# Patient Record
Sex: Female | Born: 1965 | Race: White | Hispanic: No | Marital: Married | Smoking: Former smoker
Health system: Southern US, Community
[De-identification: ages and names within clinical notes are randomized; demographics above are authoritative.]

## PROBLEM LIST (undated history)

## (undated) DIAGNOSIS — C3490 Malignant neoplasm of unspecified part of unspecified bronchus or lung: Secondary | ICD-10-CM

## (undated) DIAGNOSIS — J189 Pneumonia, unspecified organism: Secondary | ICD-10-CM

## (undated) DIAGNOSIS — F329 Major depressive disorder, single episode, unspecified: Secondary | ICD-10-CM

## (undated) DIAGNOSIS — F419 Anxiety disorder, unspecified: Secondary | ICD-10-CM

## (undated) DIAGNOSIS — R569 Unspecified convulsions: Secondary | ICD-10-CM

## (undated) DIAGNOSIS — G43909 Migraine, unspecified, not intractable, without status migrainosus: Secondary | ICD-10-CM

## (undated) DIAGNOSIS — F32A Depression, unspecified: Secondary | ICD-10-CM

## (undated) HISTORY — DX: Migraine, unspecified, not intractable, without status migrainosus: G43.909

## (undated) HISTORY — DX: Major depressive disorder, single episode, unspecified: F32.9

## (undated) HISTORY — DX: Unspecified convulsions: R56.9

## (undated) HISTORY — DX: Depression, unspecified: F32.A

## (undated) HISTORY — DX: Malignant neoplasm of unspecified part of unspecified bronchus or lung: C34.90

## (undated) HISTORY — DX: Anxiety disorder, unspecified: F41.9

---

## 1998-04-20 HISTORY — PX: ABDOMINAL HYSTERECTOMY: SHX81

## 1998-08-26 ENCOUNTER — Other Ambulatory Visit: Admission: RE | Admit: 1998-08-26 | Discharge: 1998-08-26 | Payer: Self-pay | Admitting: Obstetrics and Gynecology

## 1998-11-13 ENCOUNTER — Inpatient Hospital Stay (HOSPITAL_COMMUNITY): Admission: RE | Admit: 1998-11-13 | Discharge: 1998-11-14 | Payer: Self-pay | Admitting: Gynecology

## 1998-11-13 ENCOUNTER — Encounter (INDEPENDENT_AMBULATORY_CARE_PROVIDER_SITE_OTHER): Payer: Self-pay | Admitting: Specialist

## 1999-10-20 ENCOUNTER — Emergency Department (HOSPITAL_COMMUNITY): Admission: EM | Admit: 1999-10-20 | Discharge: 1999-10-20 | Payer: Self-pay | Admitting: Emergency Medicine

## 2000-06-02 ENCOUNTER — Other Ambulatory Visit: Admission: RE | Admit: 2000-06-02 | Discharge: 2000-06-02 | Payer: Self-pay | Admitting: Gynecology

## 2000-06-09 ENCOUNTER — Encounter: Payer: Self-pay | Admitting: Gynecology

## 2000-06-09 ENCOUNTER — Encounter: Admission: RE | Admit: 2000-06-09 | Discharge: 2000-06-09 | Payer: Self-pay | Admitting: Gynecology

## 2000-06-26 ENCOUNTER — Observation Stay (HOSPITAL_COMMUNITY): Admission: EM | Admit: 2000-06-26 | Discharge: 2000-06-27 | Payer: Self-pay | Admitting: *Deleted

## 2000-06-26 ENCOUNTER — Encounter: Payer: Self-pay | Admitting: Family Medicine

## 2000-06-29 ENCOUNTER — Encounter: Admission: RE | Admit: 2000-06-29 | Discharge: 2000-06-29 | Payer: Self-pay | Admitting: Family Medicine

## 2001-06-24 ENCOUNTER — Other Ambulatory Visit: Admission: RE | Admit: 2001-06-24 | Discharge: 2001-06-24 | Payer: Self-pay | Admitting: Gynecology

## 2002-06-02 ENCOUNTER — Encounter: Admission: RE | Admit: 2002-06-02 | Discharge: 2002-06-02 | Payer: Self-pay | Admitting: Gynecology

## 2002-06-02 ENCOUNTER — Encounter: Payer: Self-pay | Admitting: Gynecology

## 2002-06-26 ENCOUNTER — Other Ambulatory Visit: Admission: RE | Admit: 2002-06-26 | Discharge: 2002-06-26 | Payer: Self-pay | Admitting: Gynecology

## 2003-06-29 ENCOUNTER — Other Ambulatory Visit: Admission: RE | Admit: 2003-06-29 | Discharge: 2003-06-29 | Payer: Self-pay | Admitting: Gynecology

## 2003-06-29 ENCOUNTER — Encounter: Admission: RE | Admit: 2003-06-29 | Discharge: 2003-06-29 | Payer: Self-pay | Admitting: Gynecology

## 2004-03-17 ENCOUNTER — Emergency Department (HOSPITAL_COMMUNITY): Admission: EM | Admit: 2004-03-17 | Discharge: 2004-03-17 | Payer: Self-pay | Admitting: Emergency Medicine

## 2004-05-09 ENCOUNTER — Emergency Department (HOSPITAL_COMMUNITY): Admission: EM | Admit: 2004-05-09 | Discharge: 2004-05-09 | Payer: Self-pay | Admitting: *Deleted

## 2004-07-01 ENCOUNTER — Other Ambulatory Visit: Admission: RE | Admit: 2004-07-01 | Discharge: 2004-07-01 | Payer: Self-pay | Admitting: Gynecology

## 2004-10-16 ENCOUNTER — Encounter: Admission: RE | Admit: 2004-10-16 | Discharge: 2004-10-16 | Payer: Self-pay | Admitting: Gynecology

## 2005-07-15 ENCOUNTER — Other Ambulatory Visit: Admission: RE | Admit: 2005-07-15 | Discharge: 2005-07-15 | Payer: Self-pay | Admitting: Gynecology

## 2006-09-09 ENCOUNTER — Encounter: Admission: RE | Admit: 2006-09-09 | Discharge: 2006-09-09 | Payer: Self-pay | Admitting: Gynecology

## 2006-09-09 ENCOUNTER — Other Ambulatory Visit: Admission: RE | Admit: 2006-09-09 | Discharge: 2006-09-09 | Payer: Self-pay | Admitting: Gynecology

## 2010-09-05 NOTE — H&P (Signed)
[place]. [facility]  Patient:    Sharon Berg, Sharon Berg                       MRN: [id number] Adm. Date:  [id number] Disc. Date: [id number] Attending:  Sanjuana Letters Dictator:   Andrey Spearman, M.D.                         History and Physical  CHIEF COMPLAINT: Rash, swelling, and itching.  HISTORY OF PRESENT ILLNESS: This patient is a 45 year old white female who presents to the [facility] Emergency Department after leaving against medical advice from [facility] today.  The patient reports that on Tuesday while at work she had sudden onset of itching all over and swelling around the waist and just below the waist.  She then went to Urgent Care, where she was given a steroid injection, oral prednisone to take home.  Then on Wednesday she had worsening symptoms.  She then saw her primary care physician, Dr. Clovis Riley who she says works at [employer], who continued the prednisone and started Zyrtec.  On Thursday she then had acute worsening of her symptoms with her eyes swelling shut, shortness of breath, and a rash described as wet-like with migratory symptoms and itching. She then went to [facility] Emergency Room, where her examination was described as showing marked edema of the face, urticarial rash, and diffuse skin redness.  She was admitted to the hospital and begun on Solu-Medrol, Tagamet, Benadryl, p.o. Periactin, and was given a shot of subcu epinephrine. She was observed in the emergency room and did have improvement of her symptoms and then worsening two hours later.  The patient reports that despite three days of treatment in the hospital she has continued to have waxing and waning of her symptoms.  She denies any new foods, medication, makeup, lotion, detergent, hair products, clothing, skin bites, any recent viral illnesses, any recent fevers, dysuria, vaginal discharge, athletes foot, cough,  sore throat.  She denies any new exposures to anything at all.  She denies any similar prior episodes.  She does report that she did come into contact with two packages at her place of employment and states they did not seem suspicious.  She also reports she wore an old coat on Tuesday but is not able to make any association between that and her symptoms.  She did eat at a new restaurant on Monday, apparently, but did not have any symptoms at that time.  PAST MEDICAL/SURGICAL HISTORY:  1. Depression.  2. History of hysterectomy for endometriosis.  CURRENT MEDICATIONS: Zoloft 50 mg p.o. q.d.  ALLERGIES: No known drug allergies.  SOCIAL HISTORY: She has a 20 pack-year history of smoking.  She is married and has one child.  She works at a [employer], [employer].  FAMILY HISTORY: Her mother has an allergy to poison ivy, but no other family history of allergies.  No other significant problems in her family.  PHYSICAL EXAMINATION:  VITAL SIGNS: She is afebrile, temperature 98.9 degrees.  Blood pressure 96/54, pulse 79, respirations 20.  Oxygen saturation 95% on room air.  GENERAL: She is in mild distress and is emotionally upset.  HEENT: Head normocephalic, atraumatic.  PERRL.  She has very mild facial edema, which seems to be worse around the eyes.  There is no noticeable angioedema or tongue enlargement.  There is no posterior pharynx swelling.  NECK:  No lymphadenopathy, no thyromegaly.  HEART: Regular rate and rhythm without murmurs, rubs, or gallops.  LUNGS: Clear to auscultation bilaterally.  She does not have any retractions, no increased work of breathing.  No respiratory distress.  ABDOMEN: Soft, nondistended.  Good bowel sounds.  No hepatosplenomegaly.  EXTREMITIES: No edema, 2+ peripheral pulses.  SKIN: The patient has been tanning and so her skin is brown.  There is no obvious rash now.  There are no whelps, no erythema, no excoriations.  Her skin is diffusely tender to  light touch and the patient describes it as feeling "bruised."  There are no bites visible.  Of note, when the patient first presented to the ER the ER physician described her examination as mild facial edema and erythematous rash, both of which the ER physician says improved after being given Pepcid and steroids.  ASSESSMENT/PLAN: This patient is a 45 year old with what appears to be an anaphylactic reaction to an unknown allergen that has been refractory to appropriate and aggressive medical management.  1. Anaphylactic reaction.  Will go ahead and check a CBC and peripheral smear     and look for eosinophilia.  Will check a complete metabolic panel to look     at liver enzymes and bilirubin.  Will get a urinalysis to look for casts     to help rule out immune complex disease.  Will also get a chest x-ray and     EKG, mostly for baseline studies in case of worsening shortness of breath     and arrhythmia.  Will go ahead and get a sedimentation rate as well and if     normal this will help confirm out diagnosis of allergic reaction.  I have     spoken with the patient and the family and explained that we may not ever     know the cause of this reaction and will continue to treat aggressively     with Solu-Medrol, Pepcid, and Benadryl.  Will reserve epinephrine only if     the patient has respiratory distress.  If her laboratories are within     normal limits we will look for signs of improvement and eventually try to     change to p.o. medications.  2. Depression.  Continue Zoloft.  3. Tobacco use.  Will counsel the patient about cessation. DD:  06/26/00 TD:  06/28/00 Job: [id number]

## 2010-09-05 NOTE — Discharge Summary (Signed)
[place]. [facility]  Patient:    Sharon Berg, Sharon Berg                       MRN: [id number] Adm. Date:  [id number] Disc. Date: [id number] Attending:  Sanjuana Letters Dictator:   Andrey Spearman CC:         Lupe Carney, M.D., [facility]   Discharge Summary  DISCHARGE DIAGNOSES: 1. 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 2. Depression. 3. Tobacco abuse.  DISCHARGE MEDICATIONS: 1. Prednisone taper 60 mg a day x 1 day, then 50 mg a day, then 40 mg one day,    then 30 mg one day, then 20 mg one day, then 10 mg one day, then off. 2. Pepcid 20 mg p.o. b.i.d. 3. Benadryl 50 mg q.4-6h. 4. Lorazepam 0.5 mg q.6-8h. p.r.n. anxiety. 5. Zoloft 50 mg q.d.  BRIEF HISTORY OF PRESENT ILLNESS:  This patient is a 45 year old Caucasian female who presented to the emergency room after leaving against medical advice from [facility]. Her story was as follows. She initially on Tuesday began developing a welt-like rash around her waist with progressive swelling below her waist associated with generalized itching. She was seen at Urgent Care and given a steroid injection. On Wednesday, she had worsening of her symptoms and saw her private physician, Lupe Carney, M.D., who started her on Zyrtec, in addition to continuing her p.o. prednisone. On Thursday, she had acute worsening of her symptoms with her eyes swelling shut, shortness of breath, and a rash described as welt-like and migratory with associated generalized itching. She, at that point, went to [facility] Emergency Room where her exam there was described as marked edema of the face, an urticarial rash, and diffuse skin redness. She was given Solu-Medrol, Tagamet, and Benadryl, as well as p.o. Periactin and a shot of subcutaneous epinephrine. She was observed in the ER to initially have improvement but then worsening of her symptoms 2 hours later which prompted admission to the hospital. She  was admitted to the hospital and continued on Solu-Medrol, Tagamet, and Benadryl. During that time of her hospitalization, she had waxing and waning of her symptoms which is when she decided to leave against medical advice and come to [facility] Emergency Room for evaluation and possible admission. Of note, the patient denies any new foods, medicines, makeup, lotion, detergent, hair products, clothing, any recent bug bites, no recent infections. She apparently did wear an older coat the day that this started but did not associate that necessarily with her symptoms. She says she has never had an episode like this before. Only thing of note was that she did eat at a new restaurant on Monday prior to these symptoms starting.  INITIAL PHYSICAL EXAMINATION:  VITAL SIGNS:  She was afebrile, with an oxygen saturation of 95% on room air.  GENERAL:  She was emotionally upset and, therefore, in mild distress.  HEENT:  Mild facial edema, especially periorbital and perioral. There was nonoticeable tongue enlargement or posterior pharynx swelling.  LUNGS:  Clear to auscultation.  SKIN:  At the time of my exam, did not show any obvious rash. No erythema. No excoriations. Her skin was diffusely tender to light touch, however. The patient described it as feeling bruised. I could not find any bites on my initial exam.  HOSPITAL COURSE: #1 - PRESUMED ANAPHYLACTIC REACTION:  Patients history and physical is very indicative of an anaphylactic reaction. She was, therefore, started on Solu-Medrol,  Pepcid, and Benadryl in the hospital. She had rapid improvement with these medications and had continued improvement throughout the hospitalization. We did go ahead and check a CBC which was within normal limits, a urinalysis which was within normal limits, a complete metabolic panel which was significant only for slight elevation in the low 100s of her AST and ALT; otherwise was within normal limits. Chest x-ray  which was normal and EKG which was normal. As this appears to be an anaphylactic reaction and the patient was stable and improving, we decided to go ahead and send the patient home on an oral prednisone taper and continue both Pepcid and Benadryl. She will follow up with her primary M.D. who can decide the length of the course of treatment needed.  #2 - DEPRESSION:  The patient was continued on Zoloft throughout the hospitalization. The patient did complain of some anxiety during the hospitalization and stated that when she became anxious that the rash seemed to get worse. She apparently had had Xanax at [facility] and requested that on discharge. Did give her just a small supply of Ativan to get her through the next couple days. At which point, she can discuss it with her primary M.D. whether she needs to be anxiolytic long-term.  #3 - TOBACCO ABUSE:  Discussed with the patient. She has no interest in quitting at this time.  #4 - ELEVATION IN LIVER ENZYMES:  Unclear as to the etiology of this. However, feel this can be worked up as an outpatient. DD:  06/27/00 TD:  06/28/00 Job: [id number]

## 2010-11-04 ENCOUNTER — Other Ambulatory Visit: Payer: Self-pay | Admitting: Gynecology

## 2010-11-04 DIAGNOSIS — Z1231 Encounter for screening mammogram for malignant neoplasm of breast: Secondary | ICD-10-CM

## 2010-11-18 ENCOUNTER — Ambulatory Visit
Admission: RE | Admit: 2010-11-18 | Discharge: 2010-11-18 | Disposition: A | Discharge status: Home / Self Care | Payer: 59 | Source: Ambulatory Visit | Attending: Gynecology | Admitting: Gynecology

## 2010-11-18 DIAGNOSIS — Z1231 Encounter for screening mammogram for malignant neoplasm of breast: Secondary | ICD-10-CM

## 2011-12-07 ENCOUNTER — Other Ambulatory Visit: Payer: Self-pay | Admitting: Gynecology

## 2011-12-07 DIAGNOSIS — Z1231 Encounter for screening mammogram for malignant neoplasm of breast: Secondary | ICD-10-CM

## 2011-12-24 ENCOUNTER — Ambulatory Visit
Admission: RE | Admit: 2011-12-24 | Discharge: 2011-12-24 | Disposition: A | Discharge status: Home / Self Care | Payer: 59 | Source: Ambulatory Visit | Attending: Gynecology | Admitting: Gynecology

## 2011-12-24 DIAGNOSIS — Z1231 Encounter for screening mammogram for malignant neoplasm of breast: Secondary | ICD-10-CM

## 2012-04-18 ENCOUNTER — Telehealth: Payer: Self-pay | Admitting: *Deleted

## 2012-12-21 ENCOUNTER — Other Ambulatory Visit: Payer: Self-pay

## 2012-12-21 DIAGNOSIS — Z1231 Encounter for screening mammogram for malignant neoplasm of breast: Secondary | ICD-10-CM

## 2012-12-29 ENCOUNTER — Ambulatory Visit
Admission: RE | Admit: 2012-12-29 | Discharge: 2012-12-29 | Disposition: A | Discharge status: Home / Self Care | Payer: 59 | Source: Ambulatory Visit

## 2012-12-29 DIAGNOSIS — Z1231 Encounter for screening mammogram for malignant neoplasm of breast: Secondary | ICD-10-CM

## 2013-01-02 ENCOUNTER — Other Ambulatory Visit (HOSPITAL_COMMUNITY): Payer: Self-pay | Admitting: Diagnostic Radiology

## 2013-01-02 DIAGNOSIS — R928 Other abnormal and inconclusive findings on diagnostic imaging of breast: Secondary | ICD-10-CM

## 2013-01-04 ENCOUNTER — Other Ambulatory Visit: Payer: Self-pay | Admitting: Gynecology

## 2013-01-04 DIAGNOSIS — R928 Other abnormal and inconclusive findings on diagnostic imaging of breast: Secondary | ICD-10-CM

## 2013-01-16 ENCOUNTER — Ambulatory Visit
Admission: RE | Admit: 2013-01-16 | Discharge: 2013-01-16 | Disposition: A | Discharge status: Home / Self Care | Payer: 59 | Source: Ambulatory Visit | Attending: Diagnostic Radiology | Admitting: Diagnostic Radiology

## 2013-01-16 DIAGNOSIS — R928 Other abnormal and inconclusive findings on diagnostic imaging of breast: Secondary | ICD-10-CM

## 2013-04-27 ENCOUNTER — Encounter: Payer: Self-pay | Admitting: Nurse Practitioner

## 2013-04-27 ENCOUNTER — Ambulatory Visit (INDEPENDENT_AMBULATORY_CARE_PROVIDER_SITE_OTHER): Payer: 59 | Admitting: Nurse Practitioner

## 2013-04-27 VITALS — BP 112/74 | Ht 66.0 in | Wt 164.0 lb

## 2013-04-27 DIAGNOSIS — F329 Major depressive disorder, single episode, unspecified: Secondary | ICD-10-CM

## 2013-04-27 DIAGNOSIS — F341 Dysthymic disorder: Secondary | ICD-10-CM

## 2013-04-27 DIAGNOSIS — F32A Depression, unspecified: Secondary | ICD-10-CM

## 2013-04-27 DIAGNOSIS — F419 Anxiety disorder, unspecified: Principal | ICD-10-CM

## 2013-04-27 MED ORDER — SERTRALINE HCL 100 MG PO TABS: 100.0000 mg | ORAL_TABLET | Freq: Every day | ORAL | Status: DC

## 2013-04-27 MED ORDER — TRAZODONE HCL 100 MG PO TABS: ORAL_TABLET | ORAL | Status: DC

## 2013-04-27 MED ORDER — ALPRAZOLAM 0.5 MG PO TABS: 0.5000 mg | ORAL_TABLET | Freq: Every day | ORAL | Status: DC | PRN

## 2013-04-27 MED ORDER — BUPROPION HCL ER (XL) 300 MG PO TB24: 300.0000 mg | ORAL_TABLET | Freq: Every day | ORAL | Status: DC

## 2013-04-27 NOTE — Patient Instructions (Signed)
Cetaphil cream.

## 2013-04-29 ENCOUNTER — Encounter: Payer: Self-pay | Admitting: Nurse Practitioner

## 2013-04-29 NOTE — Progress Notes (Signed)
Subjective:  Presents for routine followup. Her provider in [place] has retired, would like to get her medications at our office. Her anxiety has been stable on current regimen. Sleeping well. Has had her flu vaccine. Had her physical done with her gynecologist September 2014. Continues to smoke about a pack and a half per day. No chest pain shortness of breath or edema.  Objective:   BP 112/74  Ht 5\' 6"  (1.676 m)  Wt 164 lb (74.39 kg)  BMI 26.48 kg/m2 NAD. Alert, oriented. Lungs clear. Heart regular rate rhythm.  Assessment:Anxiety and depression  Plan: Meds ordered this encounter  Medications  . DISCONTD: buPROPion (WELLBUTRIN XL) 300 MG 24 hr tablet    Sig: Take 300 mg by mouth daily.   Marland Kitchen MINIVELLE 0.05 MG/24HR patch    Sig:   . DISCONTD: sertraline (ZOLOFT) 100 MG tablet    Sig: Take 100 mg by mouth daily.   Marland Kitchen DISCONTD: traZODone (DESYREL) 100 MG tablet    Sig: 100 mg. Take 3 at bedtime if needed.  Marland Kitchen DISCONTD: ALPRAZolam (XANAX) 0.5 MG tablet    Sig: Take 0.5 mg by mouth daily as needed for anxiety.  . ALPRAZolam (XANAX) 0.5 MG tablet    Sig: Take 1 tablet (0.5 mg total) by mouth daily as needed for anxiety.    Dispense:  30 tablet    Refill:  2    Order Specific Question:  Supervising Provider    Answer:  [name] [[id number]]  . buPROPion (WELLBUTRIN XL) 300 MG 24 hr tablet    Sig: Take 1 tablet (300 mg total) by mouth daily.    Dispense:  30 tablet    Refill:  5    Order Specific Question:  Supervising Provider    Answer:  [name] [[id number]]  . sertraline (ZOLOFT) 100 MG tablet    Sig: Take 1 tablet (100 mg total) by mouth daily.    Dispense:  30 tablet    Refill:  5    Order Specific Question:  Supervising Provider    Answer:  [name] [[id number]]  . traZODone (DESYREL) 100 MG tablet    Sig: Take 3 at bedtime.    Dispense:  90 tablet    Refill:  5    Order Specific Question:  Supervising Provider    Answer:  [name] [[id number]]   continue  current regimen as directed. Recheck in 3 months, call back sooner if any problems. Lengthy discussion regarding smoking cessation.

## 2013-07-24 ENCOUNTER — Ambulatory Visit: Payer: 59 | Admitting: Family Medicine

## 2013-07-26 ENCOUNTER — Encounter: Payer: Self-pay | Admitting: Nurse Practitioner

## 2013-07-26 ENCOUNTER — Ambulatory Visit (INDEPENDENT_AMBULATORY_CARE_PROVIDER_SITE_OTHER): Payer: 59 | Admitting: Nurse Practitioner

## 2013-07-26 ENCOUNTER — Encounter: Payer: Self-pay | Admitting: Family Medicine

## 2013-07-26 VITALS — BP 110/64 | Temp 98.5°F | Ht 66.0 in | Wt 164.0 lb

## 2013-07-26 DIAGNOSIS — F341 Dysthymic disorder: Secondary | ICD-10-CM

## 2013-07-26 DIAGNOSIS — F329 Major depressive disorder, single episode, unspecified: Secondary | ICD-10-CM

## 2013-07-26 DIAGNOSIS — J069 Acute upper respiratory infection, unspecified: Secondary | ICD-10-CM

## 2013-07-26 DIAGNOSIS — F419 Anxiety disorder, unspecified: Principal | ICD-10-CM

## 2013-07-26 DIAGNOSIS — F32A Depression, unspecified: Secondary | ICD-10-CM

## 2013-07-26 MED ORDER — AZITHROMYCIN 250 MG PO TABS: ORAL_TABLET | ORAL | Status: DC

## 2013-07-26 MED ORDER — ALPRAZOLAM 0.5 MG PO TABS: 0.5000 mg | ORAL_TABLET | Freq: Every day | ORAL | Status: DC | PRN

## 2013-07-30 ENCOUNTER — Encounter: Payer: Self-pay | Admitting: Nurse Practitioner

## 2013-07-30 DIAGNOSIS — F419 Anxiety disorder, unspecified: Principal | ICD-10-CM

## 2013-07-30 DIAGNOSIS — F32A Depression, unspecified: Secondary | ICD-10-CM | POA: Insufficient documentation

## 2013-07-30 DIAGNOSIS — F329 Major depressive disorder, single episode, unspecified: Secondary | ICD-10-CM | POA: Insufficient documentation

## 2013-07-30 NOTE — Progress Notes (Signed)
Subjective:  Presents for routine followup of anxiety and depression. Doing well on her current regimen. Uses limited amount of Xanax, mainly for sleep. Also complaints of cough and congestion that began yesterday. Slight cough. No wheezing. Some chills but no documented fever. Generalized headache. Achiness. Mild sore throat. No ear pain.  Objective:   BP 110/64  Temp(Src) 98.5 F (36.9 C)  Ht 5\' 6"  (1.676 m)  Wt 164 lb (74.39 kg)  BMI 26.48 kg/m2 NAD. Alert, oriented. TMs significant clear effusion, no erythema. Pharynx injected with slightly green PND noted. Neck supple with mild soft anterior adenopathy. Lungs clear. Heart regular rate rhythm.  Assessment:Anxiety and depression  Acute upper respiratory infections of unspecified site   Plan: Meds ordered this encounter  Medications  . azithromycin (ZITHROMAX Z-PAK) 250 MG tablet    Sig: Take 2 tablets (500 mg) on  Day 1,  followed by 1 tablet (250 mg) once daily on Days 2 through 5.    Dispense:  6 each    Refill:  0    Order Specific Question:  Supervising Provider    Answer:  [name] [[id number]]  . ALPRAZolam (XANAX) 0.5 MG tablet    Sig: Take 1 tablet (0.5 mg total) by mouth daily as needed for anxiety.    Dispense:  30 tablet    Refill:  2    Order Specific Question:  Supervising Provider    Answer:  Maggie Font   Given prescription for Zithromax to start in 2-3 days if no improvement in her symptoms. Reviewed viral symptomatic care and warning signs. Call back next week if no improvement, sooner if worse. Discussed importance of healthy diet and regular activity. Recheck in 3 months, call back sooner if any problems.

## 2013-07-31 ENCOUNTER — Telehealth: Payer: Self-pay | Admitting: Nurse Practitioner

## 2013-07-31 NOTE — Telephone Encounter (Signed)
Sharon Berg on 4/8   Still not feeling much better after taking her antibiotic (zpak)  Sharon Berg told her to call back if she was not any better Symptoms: body aches, sore throat, chest congested  [facility]

## 2013-08-01 ENCOUNTER — Other Ambulatory Visit: Payer: Self-pay | Admitting: Nurse Practitioner

## 2013-08-01 MED ORDER — AMOXICILLIN-POT CLAVULANATE 875-125 MG PO TABS: 1.0000 | ORAL_TABLET | Freq: Two times a day (BID) | ORAL | Status: DC

## 2013-08-07 ENCOUNTER — Other Ambulatory Visit: Payer: Self-pay | Admitting: Nurse Practitioner

## 2013-08-07 NOTE — Telephone Encounter (Signed)
Ok plus three monthly ref 

## 2013-10-07 ENCOUNTER — Emergency Department (HOSPITAL_COMMUNITY)
Admission: EM | Admit: 2013-10-07 | Discharge: 2013-10-07 | Disposition: A | Discharge status: Home / Self Care | Payer: 59 | Attending: Emergency Medicine | Admitting: Emergency Medicine

## 2013-10-07 ENCOUNTER — Emergency Department (HOSPITAL_COMMUNITY): Payer: 59

## 2013-10-07 ENCOUNTER — Encounter (HOSPITAL_COMMUNITY): Payer: Self-pay | Admitting: Emergency Medicine

## 2013-10-07 DIAGNOSIS — N390 Urinary tract infection, site not specified: Secondary | ICD-10-CM | POA: Insufficient documentation

## 2013-10-07 DIAGNOSIS — IMO0001 Reserved for inherently not codable concepts without codable children: Secondary | ICD-10-CM | POA: Insufficient documentation

## 2013-10-07 DIAGNOSIS — Z79899 Other long term (current) drug therapy: Secondary | ICD-10-CM | POA: Insufficient documentation

## 2013-10-07 DIAGNOSIS — F17200 Nicotine dependence, unspecified, uncomplicated: Secondary | ICD-10-CM | POA: Insufficient documentation

## 2013-10-07 DIAGNOSIS — R079 Chest pain, unspecified: Secondary | ICD-10-CM | POA: Insufficient documentation

## 2013-10-07 DIAGNOSIS — R0602 Shortness of breath: Secondary | ICD-10-CM | POA: Insufficient documentation

## 2013-10-07 DIAGNOSIS — R252 Cramp and spasm: Secondary | ICD-10-CM | POA: Insufficient documentation

## 2013-10-07 DIAGNOSIS — R519 Headache, unspecified: Secondary | ICD-10-CM

## 2013-10-07 DIAGNOSIS — R51 Headache: Secondary | ICD-10-CM | POA: Insufficient documentation

## 2013-10-07 LAB — CBC WITH DIFFERENTIAL/PLATELET
BASOS ABS: 0 10*3/uL (ref 0.0–0.1)
BASOS PCT: 0 % (ref 0–1)
EOS ABS: 0.1 10*3/uL (ref 0.0–0.7)
EOS PCT: 1 % (ref 0–5)
HCT: 47.6 % — ABNORMAL HIGH (ref 36.0–46.0)
HEMOGLOBIN: 16.8 g/dL — AB (ref 12.0–15.0)
LYMPHS ABS: 4.1 10*3/uL — AB (ref 0.7–4.0)
Lymphocytes Relative: 40 % (ref 12–46)
MCH: 31.6 pg (ref 26.0–34.0)
MCHC: 35.3 g/dL (ref 30.0–36.0)
MCV: 89.5 fL (ref 78.0–100.0)
MONO ABS: 0.6 10*3/uL (ref 0.1–1.0)
MONOS PCT: 6 % (ref 3–12)
Neutro Abs: 5.3 10*3/uL (ref 1.7–7.7)
Neutrophils Relative %: 53 % (ref 43–77)
Platelets: 220 10*3/uL (ref 150–400)
RBC: 5.32 MIL/uL — ABNORMAL HIGH (ref 3.87–5.11)
RDW: 12.8 % (ref 11.5–15.5)
WBC: 10.2 10*3/uL (ref 4.0–10.5)

## 2013-10-07 LAB — COMPREHENSIVE METABOLIC PANEL
ALT: 20 U/L (ref 0–35)
AST: 20 U/L (ref 0–37)
Albumin: 3.9 g/dL (ref 3.5–5.2)
Alkaline Phosphatase: 80 U/L (ref 39–117)
BUN: 14 mg/dL (ref 6–23)
CO2: 22 meq/L (ref 19–32)
CREATININE: 0.61 mg/dL (ref 0.50–1.10)
Calcium: 9.3 mg/dL (ref 8.4–10.5)
Chloride: 105 mEq/L (ref 96–112)
GLUCOSE: 102 mg/dL — AB (ref 70–99)
POTASSIUM: 3.8 meq/L (ref 3.7–5.3)
Sodium: 141 mEq/L (ref 137–147)
Total Bilirubin: 0.5 mg/dL (ref 0.3–1.2)
Total Protein: 6.9 g/dL (ref 6.0–8.3)

## 2013-10-07 LAB — URINALYSIS, ROUTINE W REFLEX MICROSCOPIC
Bilirubin Urine: NEGATIVE
GLUCOSE, UA: NEGATIVE mg/dL
KETONES UR: NEGATIVE mg/dL
NITRITE: POSITIVE — AB
PH: 5.5 (ref 5.0–8.0)
PROTEIN: NEGATIVE mg/dL
UROBILINOGEN UA: 0.2 mg/dL (ref 0.0–1.0)

## 2013-10-07 LAB — RAPID URINE DRUG SCREEN, HOSP PERFORMED
Amphetamines: POSITIVE — AB
Barbiturates: NOT DETECTED
Benzodiazepines: POSITIVE — AB
COCAINE: NOT DETECTED
Opiates: NOT DETECTED
Tetrahydrocannabinol: NOT DETECTED

## 2013-10-07 LAB — URINE MICROSCOPIC-ADD ON

## 2013-10-07 LAB — CK: CK TOTAL: 49 U/L (ref 7–177)

## 2013-10-07 LAB — TROPONIN I

## 2013-10-07 LAB — MAGNESIUM: Magnesium: 1.9 mg/dL (ref 1.5–2.5)

## 2013-10-07 MED ORDER — CEPHALEXIN 500 MG PO CAPS: 500.0000 mg | ORAL_CAPSULE | Freq: Four times a day (QID) | ORAL | Status: DC

## 2013-10-07 MED ORDER — SODIUM CHLORIDE 0.9 % IV BOLUS (SEPSIS)
500.0000 mL | Freq: Once | INTRAVENOUS | Status: AC
  Administered 2013-10-07: 500 mL via INTRAVENOUS

## 2013-10-07 MED ORDER — HYDROCODONE-ACETAMINOPHEN 5-325 MG PO TABS: 2.0000 | ORAL_TABLET | ORAL | Status: DC | PRN

## 2013-10-07 NOTE — ED Notes (Signed)
Pt a&o in bed, right side of mouth has stopped twitching. Pt states "feeling like I'm calming down".

## 2013-10-07 NOTE — ED Notes (Signed)
Called to patients room. C/o severe sudden headache with ears ringing. States pain is all over head. Dr. Betsey Holiday.

## 2013-10-07 NOTE — ED Notes (Signed)
Per EMS, pt was running around house with dog when started having chest pain and shortness of breath. Per husband who did not witness initial cp because he was across the street, when he arrived pt was "stiff as a board" and "was not responding" to him. Husband states patient has gotten like this before "stiff" but has bounced back quickly and feels tired after, this time is longer to "bonce back from". Pt denies chest pain now but c/o tremors to right side of mouth.

## 2013-10-07 NOTE — ED Provider Notes (Signed)
CSN: [id number]     Arrival date & time 10/07/13  1625 History   First MD Initiated Contact with Patient 10/07/13 1628     Chief Complaint  Patient presents with  . Chest Pain     (Consider location/radiation/quality/duration/timing/severity/associated sxs/prior Treatment) HPI Comments: Patient presents to the ER for evaluation of chest pain. Patient reports that she was playing with her dog in her house when she started having chest pain and shortness of breath. She called EMS and by the time they arrived the chest pain had resolved. She is no longer short of breath. Since then, however, she has been experiencing twitching and some rigidity of her muscles, in her extremities, as well as around her mouth.  Patient is a 48 y.o. female presenting with chest pain.  Chest Pain Associated symptoms: shortness of breath     History reviewed. No pertinent past medical history. Past Surgical History  Procedure Laterality Date  . Abdominal hysterectomy  2000   History reviewed. No pertinent family history. History  Substance Use Topics  . Smoking status: Current Every Day Smoker -- 1.50 packs/day for 25 years    Types: Cigarettes  . Smokeless tobacco: Never Used  . Alcohol Use: Yes     Comment: rare glass of wine   OB History   Grav Para Term Preterm Abortions TAB SAB Ect Mult Living                 Review of Systems  Respiratory: Positive for shortness of breath.   Cardiovascular: Positive for chest pain.  Musculoskeletal: Positive for myalgias.  All other systems reviewed and are negative.     Allergies  Review of patient's allergies indicates no known allergies.  Home Medications   Prior to Admission medications   Medication Sig Start Date End Date Taking? Authorizing Jencarlo Bonadonna  ALPRAZolam Duanne Moron) 0.5 MG tablet take 1 tablet by mouth once daily if needed for anxiety 08/07/13  Yes [name], MD  ALPRAZolam Duanne Moron) 0.5 MG tablet Take 0.5 mg by mouth daily as needed  for anxiety.   Yes Historical Shaheen Star, MD  buPROPion (WELLBUTRIN XL) 300 MG 24 hr tablet Take 1 tablet (300 mg total) by mouth daily. 04/27/13  Yes Nilda Simmer, NP  MINIVELLE 0.05 MG/24HR patch Place 1 patch onto the skin 2 (two) times a week.  04/06/13  Yes Historical Antonya Leeder, MD  sertraline (ZOLOFT) 100 MG tablet Take 1 tablet (100 mg total) by mouth daily. 04/27/13  Yes Nilda Simmer, NP  traZODone (DESYREL) 100 MG tablet Take 300 mg by mouth at bedtime.   Yes Historical Syriah Delisi, MD   BP 115/67  Pulse 83  Temp(Src) 98.9 F (37.2 C) (Oral)  Resp 10  Ht 5\' 6"  (1.676 m)  Wt 150 lb (68.04 kg)  BMI 24.22 kg/m2  SpO2 99% Physical Exam  Constitutional: She is oriented to person, place, and time. She appears well-developed and well-nourished. No distress.  HENT:  Head: Normocephalic and atraumatic.  Right Ear: Hearing normal.  Left Ear: Hearing normal.  Nose: Nose normal.  Mouth/Throat: Oropharynx is clear and moist and mucous membranes are normal.  Eyes: Conjunctivae and EOM are normal. Pupils are equal, round, and reactive to light.  Neck: Normal range of motion. Neck supple.  Cardiovascular: Regular rhythm, S1 normal and S2 normal.  Exam reveals no gallop and no friction rub.   No murmur heard. Pulmonary/Chest: Effort normal and breath sounds normal. No respiratory distress. She exhibits no tenderness.  Abdominal: Soft. Normal appearance and bowel sounds are normal. There is no hepatosplenomegaly. There is no tenderness. There is no rebound, no guarding, no tenderness at McBurney's point and negative Murphy's sign. No hernia.  Musculoskeletal: Normal range of motion.  Neurological: She is alert and oriented to person, place, and time. She has normal strength. No cranial nerve deficit or sensory deficit. Coordination normal. GCS eye subscore is 4. GCS verbal subscore is 5. GCS motor subscore is 6.  Skin: Skin is warm, dry and intact. No rash noted. No cyanosis.  Psychiatric: She  has a normal mood and affect. Her speech is normal and behavior is normal. Thought content normal.    ED Course  Procedures (including critical care time) Labs Review Labs Reviewed  CBC WITH DIFFERENTIAL - Abnormal; Notable for the following:    RBC 5.32 (*)    Hemoglobin 16.8 (*)    HCT 47.6 (*)    Lymphs Abs 4.1 (*)    All other components within normal limits  COMPREHENSIVE METABOLIC PANEL - Abnormal; Notable for the following:    Glucose, Bld 102 (*)    All other components within normal limits  URINALYSIS, ROUTINE W REFLEX MICROSCOPIC - Abnormal; Notable for the following:    Color, Urine AMBER (*)    APPearance CLOUDY (*)    Specific Gravity, Urine >1.030 (*)    Hgb urine dipstick MODERATE (*)    Nitrite POSITIVE (*)    Leukocytes, UA SMALL (*)    All other components within normal limits  URINE RAPID DRUG SCREEN (HOSP PERFORMED) - Abnormal; Notable for the following:    Benzodiazepines POSITIVE (*)    Amphetamines POSITIVE (*)    All other components within normal limits  URINE MICROSCOPIC-ADD ON - Abnormal; Notable for the following:    Squamous Epithelial / LPF FEW (*)    Bacteria, UA MANY (*)    All other components within normal limits  TROPONIN I  MAGNESIUM  CK    Imaging Review Dg Chest 2 View  10/07/2013   CLINICAL DATA:  48 year old female with chest pain  EXAM: CHEST  2 VIEW  COMPARISON:  05/09/2004  FINDINGS: The cardiomediastinal silhouette is unremarkable.  There is no evidence of focal airspace disease, pulmonary edema, suspicious pulmonary nodule/mass, pleural effusion, or pneumothorax. No acute bony abnormalities are identified.  IMPRESSION: No active cardiopulmonary disease.   Electronically Signed   By: Hassan Rowan M.D.   On: 10/07/2013 18:22   Ct Head Wo Contrast  10/07/2013   CLINICAL DATA:  48 year old female with severe headache and seizure like activity.  EXAM: CT HEAD WITHOUT CONTRAST  TECHNIQUE: Contiguous axial images were obtained from the base  of the skull through the vertex without intravenous contrast.  COMPARISON:  None.  FINDINGS: No intracranial abnormalities are identified, including mass lesion or mass effect, hydrocephalus, extra-axial fluid collection, midline shift, hemorrhage, or acute infarction.  The visualized bony calvarium is unremarkable.  IMPRESSION: Unremarkable noncontrast head CT.   Electronically Signed   By: Hassan Rowan M.D.   On: 10/07/2013 18:49     EKG Interpretation   Date/Time:  Saturday October 07 2013 16:28:56 EDT Ventricular Rate:  65 PR Interval:  155 QRS Duration: 88 QT Interval:  405 QTC Calculation: 421 R Axis:   73 Text Interpretation:  Sinus rhythm Normal ECG Confirmed by POLLINA  MD,  CHRISTOPHER ([id number]) on 10/07/2013 4:46:16 PM      MDM   Final diagnoses:  Chest pain, unspecified chest pain  type  Muscle cramping  UTI (lower urinary tract infection)  Acute nonintractable headache, unspecified headache type   Patient presents to the ER for evaluation of chest pain and shortness of breath. Both are resolved at the time of arrival to the ER. The patient was having residual muscle spasms including spasms of the muscles around her face. This raises suspicion for possible panic attack causing the earlier symptoms. She has not had any recurrent chest pain here in the ER. EKG was unremarkable. Troponin negative. Remainder of the bloodwork was normal. Urinalysis does suggest infection.  Patient developed a headache here in the ER. CT of her head was performed and was negative. She has no neurologic dysfunction on examination.  Patient's pain was short-lived it does seem somewhat atypical. Her workup was negative. I do not feel she requires hospitalization as she has not had any further symptoms of chest pain. She can be discharged, followup with her primary doctor in the office. Return if any recurrent and sustained chest pain.  Orpah Greek, MD 10/07/13 Lurline Hare

## 2013-10-07 NOTE — Discharge Instructions (Signed)
Aspirin and Your Heart Aspirin affects the way your blood clots and helps "thin" the blood. Aspirin has many uses in heart disease. It may be used as a primary prevention to help reduce the risk of heart related events. It also can be used as a secondary measure to prevent more heart attacks or to prevent additional damage from blood clots.  ASPIRIN MAY HELP IF YOU:  Have had a heart attack or chest pain.  Have undergone open heart surgery such as CABG (Coronary Artery Bypass Surgery).  Have had coronary angioplasty with or without stents.  Have experienced a stroke or TIA (transient ischemic attack).  Have peripheral vascular disease (PAD).  Have chronic heart rhythm problems such as atrial fibrillation.  Are at risk for heart disease. BEFORE STARTING ASPIRIN Before you start taking aspirin, your caregiver will need to review your medical history. Many things will need to be taken into consideration, such as:  Smoking status.  Blood pressure.  Diabetes.  Gender.  Weight.  Cholesterol level. ASPIRIN DOSES  Aspirin should only be taken on the advice of your caregiver. Talk to your caregiver about how much aspirin you should take. Aspirin comes in different doses such as:  81 mg.  162 mg.  325 mg.  The aspirin dose you take may be affected by many factors, some of which include:  Your current medications, especially if your are taking blood-thinners or anti-platelet medicine.  Liver function.  Heart disease risk.  Age.  Aspirin comes in two forms:  Non-enteric-coated. This type of aspirin does not have a coating and is absorbed faster. Non-enteric coated aspirin is recommended for patients experiencing chest pain symptoms. This type of aspirin also comes in a chewable form.  Enteric-coated. This means the aspirin has a special coating that releases the medicine very slowly. Enteric-coated aspirin causes less stomach upset. This type of aspirin should not be chewed  or crushed. ASPIRIN SIDE EFFECTS Daily use of aspirin can increase your risk of serious side effects. Some of these include:  Increased bleeding. This can range from a cut that does not stop bleeding to more serious problems such as stomach bleeding or bleeding into the brain (Intracerebral bleeding).  Increased bruising.  Stomach upset.  An allergic reaction such as red, itchy skin.  Increased risk of bleeding when combined with non-steroidal anti-inflammatory medicine (NSAIDS).  Alcohol should be drank in moderation when taking aspirin. Alcohol can increase the risk of stomach bleeding when taken with aspirin.  Aspirin should not be given to children less than 68 years of age due to the association of Reye syndrome. Reye syndrome is a serious illness that can affect the brain and liver. Studies have linked Reye syndrome with aspirin use in children.  People that have nasal polyps have an increased risk of developing an aspirin allergy. SEEK MEDICAL CARE IF:   You develop an allergic reaction such as:  Hives.  Itchy skin.  Swelling of the lips, tongue or face.  You develop stomach pain.  You have unusual bleeding or bruising.  You have ringing in your ears. SEEK IMMEDIATE MEDICAL CARE IF:   You have severe chest pain, especially if the pain is crushing or pressure-like and spreads to the arms, back, neck, or jaw. THIS IS AN EMERGENCY. Do not wait to see if the pain will go away. Get medical help at once. Call your local emergency services (911 in the [place] DO NOT drive yourself to the hospital.  You have stroke-like symptoms  such as:  Loss of vision.  Difficulty talking.  Numbness or weakness on one side of your body.  Numbness or weakness in your arm or leg.  Not thinking clearly or feeling confused.  Your bowel movements are bloody, dark red or black in color.  You vomit or cough up blood.  You have blood in your urine.  You have shortness of breath,  coughing or wheezing. MAKE SURE YOU:   Understand these instructions.  Will monitor your condition.  Seek immediate medical care if necessary. Document Released: 03/19/2008 Document Revised: 08/01/2012 Document Reviewed: 03/19/2008 [facility] Patient Information 2015 [place], [place]. This information is not intended to replace advice given to you by your health care provider. Make sure you discuss any questions you have with your health care provider.  Chest Pain (Nonspecific) It is often hard to give a specific diagnosis for the cause of chest pain. There is always a chance that your pain could be related to something serious, such as a heart attack or a blood clot in the lungs. You need to follow up with your health care provider for further evaluation. CAUSES   Heartburn.  Pneumonia or bronchitis.  Anxiety or stress.  Inflammation around your heart (pericarditis) or lung (pleuritis or pleurisy).  A blood clot in the lung.  A collapsed lung (pneumothorax). It can develop suddenly on its own (spontaneous pneumothorax) or from trauma to the chest.  Shingles infection (herpes zoster virus). The chest wall is composed of bones, muscles, and cartilage. Any of these can be the source of the pain.  The bones can be bruised by injury.  The muscles or cartilage can be strained by coughing or overwork.  The cartilage can be affected by inflammation and become sore (costochondritis). DIAGNOSIS  Lab tests or other studies may be needed to find the cause of your pain. Your health care provider may have you take a test called an ambulatory electrocardiogram (ECG). An ECG records your heartbeat patterns over a 24-hour period. You may also have other tests, such as:  Transthoracic echocardiogram (TTE). During echocardiography, sound waves are used to evaluate how blood flows through your heart.  Transesophageal echocardiogram (TEE).  Cardiac monitoring. This allows your health care provider  to monitor your heart rate and rhythm in real time.  Holter monitor. This is a portable device that records your heartbeat and can help diagnose heart arrhythmias. It allows your health care provider to track your heart activity for several days, if needed.  Stress tests by exercise or by giving medicine that makes the heart beat faster. TREATMENT   Treatment depends on what may be causing your chest pain. Treatment may include:  Acid blockers for heartburn.  Anti-inflammatory medicine.  Pain medicine for inflammatory conditions.  Antibiotics if an infection is present.  You may be advised to change lifestyle habits. This includes stopping smoking and avoiding alcohol, caffeine, and chocolate.  You may be advised to keep your head raised (elevated) when sleeping. This reduces the chance of acid going backward from your stomach into your esophagus. Most of the time, nonspecific chest pain will improve within 2-3 days with rest and mild pain medicine.  HOME CARE INSTRUCTIONS   If antibiotics were prescribed, take them as directed. Finish them even if you start to feel better.  For the next few days, avoid physical activities that bring on chest pain. Continue physical activities as directed.  Do not use any tobacco products, including cigarettes, chewing tobacco, or electronic cigarettes.  Avoid drinking alcohol.  Only take medicine as directed by your health care provider.  Follow your health care provider's suggestions for further testing if your chest pain does not go away.  Keep any follow-up appointments you made. If you do not go to an appointment, you could develop lasting (chronic) problems with pain. If there is any problem keeping an appointment, call to reschedule. SEEK MEDICAL CARE IF:   Your chest pain does not go away, even after treatment.  You have a rash with blisters on your chest.  You have a fever. SEEK IMMEDIATE MEDICAL CARE IF:   You have increased  chest pain or pain that spreads to your arm, neck, jaw, back, or abdomen.  You have shortness of breath.  You have an increasing cough, or you cough up blood.  You have severe back or abdominal pain.  You feel nauseous or vomit.  You have severe weakness.  You faint.  You have chills. This is an emergency. Do not wait to see if the pain will go away. Get medical help at once. Call your local emergency services (911 in [place] Do not drive yourself to the hospital. MAKE SURE YOU:   Understand these instructions.  Will watch your condition.  Will get help right away if you are not doing well or get worse. Document Released: 01/14/2005 Document Revised: 04/11/2013 Document Reviewed: 11/10/2007 [facility] Patient Information 2015 [place], [place]. This information is not intended to replace advice given to you by your health care provider. Make sure you discuss any questions you have with your health care provider.  Urinary Tract Infection Urinary tract infections (UTIs) can develop anywhere along your urinary tract. Your urinary tract is your body's drainage system for removing wastes and extra water. Your urinary tract includes two kidneys, two ureters, a bladder, and a urethra. Your kidneys are a pair of bean-shaped organs. Each kidney is about the size of your fist. They are located below your ribs, one on each side of your spine. CAUSES Infections are caused by microbes, which are microscopic organisms, including fungi, viruses, and bacteria. These organisms are so small that they can only be seen through a microscope. Bacteria are the microbes that most commonly cause UTIs. SYMPTOMS  Symptoms of UTIs may vary by age and gender of the patient and by the location of the infection. Symptoms in young women typically include a frequent and intense urge to urinate and a painful, burning feeling in the bladder or urethra during urination. Older women and men are more likely to be tired, shaky,  and weak and have muscle aches and abdominal pain. A fever may mean the infection is in your kidneys. Other symptoms of a kidney infection include pain in your back or sides below the ribs, nausea, and vomiting. DIAGNOSIS To diagnose a UTI, your caregiver will ask you about your symptoms. Your caregiver also will ask to provide a urine sample. The urine sample will be tested for bacteria and white blood cells. White blood cells are made by your body to help fight infection. TREATMENT  Typically, UTIs can be treated with medication. Because most UTIs are caused by a bacterial infection, they usually can be treated with the use of antibiotics. The choice of antibiotic and length of treatment depend on your symptoms and the type of bacteria causing your infection. HOME CARE INSTRUCTIONS  If you were prescribed antibiotics, take them exactly as your caregiver instructs you. Finish the medication even if you feel better after you  have only taken some of the medication.  Drink enough water and fluids to keep your urine clear or pale yellow.  Avoid caffeine, tea, and carbonated beverages. They tend to irritate your bladder.  Empty your bladder often. Avoid holding urine for long periods of time.  Empty your bladder before and after sexual intercourse.  After a bowel movement, women should cleanse from front to back. Use each tissue only once. SEEK MEDICAL CARE IF:   You have back pain.  You develop a fever.  Your symptoms do not begin to resolve within 3 days. SEEK IMMEDIATE MEDICAL CARE IF:   You have severe back pain or lower abdominal pain.  You develop chills.  You have nausea or vomiting.  You have continued burning or discomfort with urination. MAKE SURE YOU:   Understand these instructions.  Will watch your condition.  Will get help right away if you are not doing well or get worse. Document Released: 01/14/2005 Document Revised: 10/06/2011 Document Reviewed:  05/15/2011 [facility] Patient Information 2015 [place], [place]. This information is not intended to replace advice given to you by your health care provider. Make sure you discuss any questions you have with your health care provider.

## 2013-10-07 NOTE — ED Notes (Signed)
MD at bedside. 

## 2013-10-07 NOTE — ED Notes (Signed)
No concerns voiced or c/o pain or discomfort on discharge. Verbalized understanding of follow up instructions and emergency instructions.

## 2013-10-10 ENCOUNTER — Encounter: Payer: Self-pay | Admitting: Family Medicine

## 2013-10-10 ENCOUNTER — Ambulatory Visit (INDEPENDENT_AMBULATORY_CARE_PROVIDER_SITE_OTHER): Payer: 59 | Admitting: Family Medicine

## 2013-10-10 VITALS — BP 120/60 | Ht 66.0 in | Wt 156.4 lb

## 2013-10-10 DIAGNOSIS — R064 Hyperventilation: Secondary | ICD-10-CM

## 2013-10-10 DIAGNOSIS — R55 Syncope and collapse: Secondary | ICD-10-CM

## 2013-10-10 NOTE — Progress Notes (Signed)
   Subjective:    Patient ID: Sharon Berg, female    DOB: 20-Aug-1965, 48 y.o.   MRN: [id number]  HPI Patient is here today for a ER follow up visit. Patient states she had some type of seizure possibly. Patient states that she still feels very shaky and nervous. Patient is currently taking Keflex that was prescribed by the hospital because she was also told she had a UTI.   I patient notes that she was feeling anxious before all this occur. She was sitting at her computer. On further history she just gotten off the phone with her son-in-law. Apparently her daughter is having a lot of trouble with her marriage. This worries the patient considerably.  Patient had a drawing up sensation in her arms time that. She developed shortness of breath. She developed chest discomfort.  Patient does smoke. She does have family history of heart disease. Next  Her biggest concern is potential for stroke or strokelike illness. Next  Did develop a headache after presenting to the emergency room.  Patient states that she has no other concerns at this time.   Reg exercise not reg these days  Lot of walking with job  Day numb [id number]  Review of Systems No chest pain currently no abdominal pain or change bowel habits no blood in stool no headache no change in urinary habits no rash no loss of consciousness ROS otherwise negative    Objective:   Physical Exam   Alert no acute distress. HEENT normal. Lungs clear. Heart rare in rhythm. Abdomen normal. Neuro intact.  Complete hospital record reviewed in the presence of patient.     Assessment & Plan:  Impression near syncopal events with very much atypical features. Patient had twitching drawing up sensation. On further history passed out frequently earlier in life. Has ongoing challenges with anxiety and depression. Currently stressed out by daughter's situation. Patient is very scared about potential for stroke. 35 minutes spent most in discussion. I  think this really represents a near panic attack long with hyperventilation/vasovagal episode. Plan neurology referral per patient's request. Advised to use Xanax when necessary. Exercise encourage. Maintain usual meds. Followup as scheduled. Try to cut down smoking. WSL

## 2013-10-17 ENCOUNTER — Telehealth: Payer: Self-pay | Admitting: Family Medicine

## 2013-10-17 NOTE — Telephone Encounter (Signed)
Pt calling to check on her Referral to Neurology for her Seizure She feels that it needs to be tended too sooner rather than later  Please call with an update

## 2013-10-17 NOTE — Telephone Encounter (Signed)
LMOVM to notify pt that referral has been sent to [facility] and they will call her to set up appt

## 2013-10-26 ENCOUNTER — Ambulatory Visit (INDEPENDENT_AMBULATORY_CARE_PROVIDER_SITE_OTHER): Payer: 59 | Admitting: Neurology

## 2013-10-26 ENCOUNTER — Telehealth: Payer: Self-pay | Admitting: Family Medicine

## 2013-10-26 ENCOUNTER — Encounter: Payer: Self-pay | Admitting: Neurology

## 2013-10-26 VITALS — BP 98/68 | HR 76 | Ht 66.0 in | Wt 158.4 lb

## 2013-10-26 DIAGNOSIS — G40209 Localization-related (focal) (partial) symptomatic epilepsy and epileptic syndromes with complex partial seizures, not intractable, without status epilepticus: Secondary | ICD-10-CM

## 2013-10-26 MED ORDER — LEVETIRACETAM 500 MG PO TABS: 500.0000 mg | ORAL_TABLET | Freq: Two times a day (BID) | ORAL | Status: DC

## 2013-10-26 MED ORDER — BUPROPION HCL ER (XL) 150 MG PO TB24: ORAL_TABLET | ORAL | Status: DC

## 2013-10-26 NOTE — Telephone Encounter (Signed)
Pt calling to say that she went to so the specialist about her seizure He told her that the buPROPion (WELLBUTRIN XL) 300 MG 24 hr tablet  Has the side effect of having seizures. He would like her to come off this if  Possible an be put on something else if drug is needed.   Please call Sharon Berg at home to advise

## 2013-10-26 NOTE — Telephone Encounter (Signed)
Discussed with patient. Patient currently on wellbutrin and zoloft. Consult with Dr. Richardson Landry- Dr Richardson Landry advises patient to taper off of Wellbutrin and maintain Zoloft-hold on adding Lexapro at this time. Patient verbalized understanding.

## 2013-10-26 NOTE — Telephone Encounter (Signed)
ntsw--speak with her first, how she's doing etc. (told by neuro she can't drive so likely stressed out). Rec change ot wel butrin xl 150 for seven days each morn, then every other day for seven d then stop.  Day after stopping initiate lexapro 10 qd numb thirty three ref, ck one mo afterstarting  in office

## 2013-10-26 NOTE — Progress Notes (Signed)
[facility] [address] [place] [address] [phone]       OFFICE CONSULT NOTE  Ms. Sharon Berg Date of Birth:  Jan 26, 1966 Medical Record Number:  [id number]   Referring MD:  Sallee Lange Reason for Referral:  Syncope versus seizure HPI: 48 year Caucasian lady in who had a episode of syncope versus seizure on 10/10/13. Patient states that she was sitting on her computer when she first started feeling short of breath as well as had some chest pain. She is able to walk to the couch but couldn't felt too weak to call up her husband was outside in the ER. She said her mother was a form who proceeded to let her husband know. The husband walked into the room and states that he noticed that wife was stretching her face was pulled to the left and she was drooling from the common of the mouth. Her eyes were open but she was stating in the distance and was not able to focus on him. This lasted for about 15-20 minutes by the time EMS got there. She subsequently was more responsive but felt tired for the rest of the day and had a severe headache. She felt exhausted for several days. She also noted that she had some intermittent twitching of the right, face off and on for a few days as well as some word finding difficulties which have now completely all resolved. She denies any prior episode suggestive of this though she has had multiple episodes of brief passing out or syncope since age 48. She feels that she has a nontender lumbar episode is coming on. Her husband is also blunted to be calm this. The patient off and also to husband who makes her sit down and rest. She goes completely limp head eyes closed for him in toto and subsequently when she wakes up except she is tired and exhausted. She is never seen a neurologist or an EEG or brain MRI scan done. These episodes occur at a frequency of once every few months. No clear trigger for these episodes. She has not had a Holter  monitor or cardiac evaluation for this. There is no history of childhood epilepsy, febrile seizure significant head injury or loss of consciousness. There is no family history of epilepsy or significant other problems. Patient does have remote history history of migraine headaches but these are not quite frequent and she did not have her headaches usually with these brief passing out episodes. She has long-standing history of anxiety and stress and has been calm Xanax, Zoloft and Wellbutrin. She's been taking Wellbutrin for 3 days she 3 years and has not had any recent change in her dose.  ROS:   14 system review of systems is positive for  passing out, tiredness, sleepiness, fatigue, anxiety, decreased energy, snoring and headache.  PMH:  Past Medical History  Diagnosis Date  . Migraine   . Depression   . Anxiety     Social History:  History   Social History  . Marital Status: Married    Spouse Name: jack    Number of Children: 1  . Years of Education: college   Occupational History  . [employer]    Social History Main Topics  . Smoking status: Current Every Day Smoker -- 1.50 packs/day for 25 years    Types: Cigarettes  . Smokeless tobacco: Never Used  . Alcohol Use: Yes     Comment: rare glass of wine  . Drug  Use: No  . Sexual Activity: Yes    Birth Control/ Protection: Surgical   Other Topics Concern  . Not on file   Social History Narrative  . No narrative on file    Medications:   Current Outpatient Prescriptions on File Prior to Visit  Medication Sig Dispense Refill  . ALPRAZolam (XANAX) 0.5 MG tablet take 1 tablet by mouth once daily if needed for anxiety  30 tablet  3  . buPROPion (WELLBUTRIN XL) 300 MG 24 hr tablet Take 1 tablet (300 mg total) by mouth daily.  30 tablet  5  . HYDROcodone-acetaminophen (NORCO/VICODIN) 5-325 MG per tablet Take 2 tablets by mouth every 4 (four) hours as needed for moderate pain.  20 tablet  0  . MINIVELLE 0.05 MG/24HR patch Place  1 patch onto the skin 2 (two) times a week.       . sertraline (ZOLOFT) 100 MG tablet Take 1 tablet (100 mg total) by mouth daily.  30 tablet  5  . traZODone (DESYREL) 100 MG tablet Take 300 mg by mouth at bedtime.       No current facility-administered medications on file prior to visit.    Allergies:  No Known Allergies  Physical Exam General: well developed, well nourished, seated, in no evident distress Head: head normocephalic and atraumatic. Orohparynx benign Neck: supple with no carotid or supraclavicular bruits Cardiovascular: regular rate and rhythm, no murmurs Musculoskeletal: no deformity Skin:  no rash/petichiae Vascular:  Normal pulses all extremities  Neurologic Exam Mental Status: Awake and fully alert. Oriented to place and time. Recent and remote memory intact. Attention span, concentration and fund of knowledge appropriate. Mood and affect appropriate.  Cranial Nerves: Fundoscopic exam reveals sharp disc margins. Pupils equal, briskly reactive to light. Extraocular movements full without nystagmus. Visual fields full to confrontation. Hearing intact. Facial sensation intact. Face, tongue, palate moves normally and symmetrically.  Motor: Normal bulk and tone. Normal strength in all tested extremity muscles. Sensory.: intact to touch and pinprick and vibratory sensation.  Coordination: Rapid alternating movements normal in all extremities. Finger-to-nose and heel-to-shin performed accurately bilaterally. Gait and Station: Arises from chair without difficulty. Stance is normal. Gait demonstrates normal stride length and balance . Able to heel, toe and tandem walk without difficulty.  Reflexes: 1+ and symmetric. Toes downgoing.       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 Long-standing history of transient episodes of brief loss of  consciousness syncope versus complex partial seizures. Significant underlying long-standing anxiety.    PLAN: I had a long discussion with the patient and her husband regarding her episodes of passing out as well as the recent episode which may suggest a complex partial seizure. I recommend starting Keppra 500 mg twice daily for seizure prophylaxis and checking brain MRI scan and EEG. I advised her not to drive as per [facility] law for the next several months. She was also advised to discuss tapering and discontinuation of Wellbutrin with her primary care physician. Return for followup in 6 weeks or call earlier if necessary.    Note: This document was prepared with digital dictation and possible smart phrase technology. Any transcriptional errors that result from this process are unintentional.

## 2013-10-26 NOTE — Patient Instructions (Signed)
I had a long discussion with the patient and her husband regarding her episodes of passing out as well as the recent episode which may suggest a complex partial seizure. I recommend starting Keppra 500 mg twice daily for seizure prophylaxis and checking brain MRI scan and EEG. I advised her not to drive has [place] for the next several months. She was also advised to discuss tapering and discontinuation of Wellbutrin with her primary care physician. Return for followup in 6 weeks or call earlier if necessary.  Seizure, Adult A seizure is abnormal electrical activity in the brain. Seizures usually last from 30 seconds to 2 minutes. There are various types of seizures. Before a seizure, you may have a warning sensation (aura) that a seizure is about to occur. An aura may include the following symptoms:   Fear or anxiety.  Nausea.  Feeling like the room is spinning (vertigo).  Vision changes, such as seeing flashing lights or spots. Common symptoms during a seizure include:  A change in attention or behavior (altered mental status).  Convulsions with rhythmic jerking movements.  Drooling.  Rapid eye movements.  Grunting.  Loss of bladder and bowel control.  Bitter taste in the mouth.  Tongue biting. After a seizure, you may feel confused and sleepy. You may also have an injury resulting from convulsions during the seizure. HOME CARE INSTRUCTIONS   If you are given medicines, take them exactly as prescribed by your health care provider.  Keep all follow-up appointments as directed by your health care provider.  Do not swim or drive or engage in risky activity during which a seizure could cause further injury to you or others until your health care provider says it is OK.  Get adequate rest.  Teach friends and family what to do if you have a seizure. They should:  Lay you on the ground to prevent a fall.  Put a cushion under your head.  Loosen any tight clothing  around your neck.  Turn you on your side. If vomiting occurs, this helps keep your airway clear.  Stay with you until you recover.  Know whether or not you need emergency care. SEEK IMMEDIATE MEDICAL CARE IF:  The seizure lasts longer than 5 minutes.  The seizure is severe or you do not wake up immediately after the seizure.  You have an altered mental status after the seizure.  You are having more frequent or worsening seizures. Someone should drive you to the emergency department or call local emergency services (911 in [place] MAKE SURE YOU:  Understand these instructions.  Will watch your condition.  Will get help right away if you are not doing well or get worse. Document Released: 1Jul 15, 202001 Document Revised: 01/25/2013 Document Reviewed: 11/16/2012 [facility] Patient Information 2015 [place], [place]. This information is not intended to replace advice given to you by your health care provider. Make sure you discuss any questions you have with your health care provider.

## 2013-11-01 ENCOUNTER — Other Ambulatory Visit: Payer: Self-pay | Admitting: Neurology

## 2013-11-01 ENCOUNTER — Encounter: Payer: Self-pay | Admitting: *Deleted

## 2013-11-01 ENCOUNTER — Encounter: Payer: Self-pay | Admitting: Diagnostic Neuroimaging

## 2013-11-01 ENCOUNTER — Other Ambulatory Visit (INDEPENDENT_AMBULATORY_CARE_PROVIDER_SITE_OTHER): Payer: 59 | Admitting: Radiology

## 2013-11-01 ENCOUNTER — Ambulatory Visit (INDEPENDENT_AMBULATORY_CARE_PROVIDER_SITE_OTHER): Payer: 59

## 2013-11-01 DIAGNOSIS — G40209 Localization-related (focal) (partial) symptomatic epilepsy and epileptic syndromes with complex partial seizures, not intractable, without status epilepticus: Secondary | ICD-10-CM

## 2013-11-01 NOTE — Progress Notes (Signed)
Patient came to office for EEG and MRI today. While in the MRI scanner, after several sequences, patient had loss of consciousness with brief convulsions lasting less than 1 minute. MRI technologist noticed this, stop scanning and attended the patient. Patient was stable. No tongue biting or incontinence. No postictal confusion. Patient was able to be transferred into the office for evaluation. While in the office patient felt twitching sensation in her face and lay down.   Patient was taken to the examination room, vital signs were taken. Blood pressure 115/75, heart rate 75, O2 sat 97%. Blood glucose 94. Patient was calm and comfortable. Patient's husband was in the room. Neurologic exam was nonfocal.   Patient reports that she has not started her levetiracetam yet. I discussed case with her primary neurologist Dr. Leonie Man by phone.   PLAN: - patient to go home with husband, and start levetiracetam today. 500mg  when she gets home, then 500mg  in the evening. Tomorrow will continut LEV 500mg  BID. - if patient has more seizure activity, then call 911 or go to ER - follow up with Dr. Leonie Man as scheduled.  Penni Bombard, MD 05/20/8655, 8:46 PM Certified in Neurology, Neurophysiology and Neuroimaging  [facility] [address] [place],  [address] [phone]

## 2013-11-02 ENCOUNTER — Other Ambulatory Visit: Payer: 59

## 2013-11-07 ENCOUNTER — Telehealth: Payer: Self-pay | Admitting: Neurology

## 2013-11-07 NOTE — Telephone Encounter (Signed)
Patient called stating she has been having a headache for two days and she has take tylenol and ibuprofen. Patient had two seizures in the office on last week.

## 2013-11-07 NOTE — Telephone Encounter (Signed)
Spoke with patient and explained that Dr Leonie Man has not seen her for headaches and that she should f/u with pcp, she replied that she cannot take off work right now  since being off last week for the seizures and did see physician in office. She has been taking Keppra now according to directions.

## 2013-11-09 ENCOUNTER — Other Ambulatory Visit: Payer: Self-pay | Admitting: Nurse Practitioner

## 2013-12-17 ENCOUNTER — Other Ambulatory Visit: Payer: Self-pay | Admitting: Nurse Practitioner

## 2013-12-18 ENCOUNTER — Ambulatory Visit (INDEPENDENT_AMBULATORY_CARE_PROVIDER_SITE_OTHER): Payer: 59 | Admitting: Neurology

## 2013-12-18 ENCOUNTER — Encounter: Payer: Self-pay | Admitting: Neurology

## 2013-12-18 VITALS — BP 102/64 | HR 70 | Ht 65.0 in | Wt 159.2 lb

## 2013-12-18 DIAGNOSIS — G44209 Tension-type headache, unspecified, not intractable: Secondary | ICD-10-CM

## 2013-12-18 MED ORDER — DIVALPROEX SODIUM ER 500 MG PO TB24: 500.0000 mg | ORAL_TABLET | Freq: Two times a day (BID) | ORAL | Status: DC

## 2013-12-18 NOTE — Patient Instructions (Signed)
I had a long discussion with the patient and her husband regarding her daily headaches likely being tension headaches with an analgesic rebound. I advised her to discontinue ibuprofen and use Tylenol instead but limit to not more than 2 or 3 days per week. Discontinue Keppra as it's not working instead try Depakote ER 500 twice daily to help with headache as well as possible seizure episodes. I also advised her to increase participation in activities for stress relaxation like regular exercise, swimming, medication for you. Return for followup in 2 months or earlier if necessary. Tension Headache A tension headache is a feeling of pain, pressure, or aching often felt over the front and sides of the head. The pain can be dull or can feel tight (constricting). It is the most common type of headache. Tension headaches are not normally associated with nausea or vomiting and do not get worse with physical activity. Tension headaches can last 30 minutes to several days.  CAUSES  The exact cause is not known, but it may be caused by chemicals and hormones in the brain that lead to pain. Tension headaches often begin after stress, anxiety, or depression. Other triggers may include:  Alcohol.  Caffeine (too much or withdrawal).  Respiratory infections (colds, flu, sinus infections).  Dental problems or teeth clenching.  Fatigue.  Holding your head and neck in one position too long while using a computer. SYMPTOMS   Pressure around the head.   Dull, aching head pain.   Pain felt over the front and sides of the head.   Tenderness in the muscles of the head, neck, and shoulders. DIAGNOSIS  A tension headache is often diagnosed based on:   Symptoms.   Physical examination.   A CT scan or MRI of your head. These tests may be ordered if symptoms are severe or unusual. TREATMENT  Medicines may be given to help relieve symptoms.  HOME CARE INSTRUCTIONS   Only take over-the-counter or  prescription medicines for pain or discomfort as directed by your caregiver.   Lie down in a dark, quiet room when you have a headache.   Keep a journal to find out what may be triggering your headaches. For example, write down:  What you eat and drink.  How much sleep you get.  Any change to your diet or medicines.  Try massage or other relaxation techniques.   Ice packs or heat applied to the head and neck can be used. Use these 3 to 4 times per day for 15 to 20 minutes each time, or as needed.   Limit stress.   Sit up straight, and do not tense your muscles.   Quit smoking if you smoke.  Limit alcohol use.  Decrease the amount of caffeine you drink, or stop drinking caffeine.  Eat and exercise regularly.  Get 7 to 9 hours of sleep, or as recommended by your caregiver.  Avoid excessive use of pain medicine as recurrent headaches can occur.  SEEK MEDICAL CARE IF:   You have problems with the medicines you were prescribed.  Your medicines do not work.  You have a change from the usual headache.  You have nausea or vomiting. SEEK IMMEDIATE MEDICAL CARE IF:   Your headache becomes severe.  You have a fever.  You have a stiff neck.  You have loss of vision.  You have muscular weakness or loss of muscle control.  You lose your balance or have trouble walking.  You feel faint or pass out.  You have severe symptoms that are different from your first symptoms. MAKE SURE YOU:   Understand these instructions.  Will watch your condition.  Will get help right away if you are not doing well or get worse. Document Released: 04/06/2005 Document Revised: 06/29/2011 Document Reviewed: 03/27/2011 [facility] Patient Information 2015 [place], [place]. This information is not intended to replace advice given to you by your health care provider. Make sure you discuss any questions you have with your health care provider.

## 2013-12-18 NOTE — Progress Notes (Signed)
[facility] [address] [place]. [place] [address] [phone] [id number]       OFFICE FOLLOW UP VISIT NOTE  Ms. Sharon Berg Date of Birth:  October 24, 1965 Medical Record Number:  [id number]   Referring MD:  Sallee Lange Reason for Referral:  Syncope versus seizure HPI: 48 year Caucasian lady in who had a episode of syncope versus seizure on 10/10/13. Patient states that she was sitting on her computer when she first started feeling short of breath as well as had some chest pain. She is able to walk to the couch but couldn't felt too weak to call up her husband was outside in the ER. She said her mother was a form who proceeded to let her husband know. The husband walked into the room and states that he noticed that wife was stretching her face was pulled to the left and she was drooling from the common of the mouth. Her eyes were open but she was stating in the distance and was not able to focus on him. This lasted for about 15-20 minutes by the time EMS got there. She subsequently was more responsive but felt tired for the rest of the day and had a severe headache. She felt exhausted for several days. She also noted that she had some intermittent twitching of the right, face off and on for a few days as well as some word finding difficulties which have now completely all resolved. She denies any prior episode suggestive of this though she has had multiple episodes of brief passing out or syncope since age 48. She feels that she has a nontender lumbar episode is coming on. Her husband is also blunted to be calm this. The patient off and also to husband who makes her sit down and rest. She goes completely limp head eyes closed for him in toto and subsequently when she wakes up except she is tired and exhausted. She is never seen a neurologist or an EEG or brain MRI scan done. These episodes occur at a frequency of once every few months. No clear trigger for these episodes. She has not had a  Holter monitor or cardiac evaluation for this. There is no history of childhood epilepsy, febrile seizure significant head injury or loss of consciousness. There is no family history of epilepsy or significant other problems. Patient does have remote history history of migraine headaches but these are not quite frequent and she did not have her headaches usually with these brief passing out episodes. She has long-standing history of anxiety and stress and has been calm Xanax, Zoloft and Wellbutrin. She's been taking Wellbutrin for 3 days she 3 years and has not had any recent change in her dose. Update 12/18/2013 : She returns for followup of the initial consult on 10/21/13. She states she continues to have episodes of possible seizures. She had 5 such episodes since last visit. One of them occurred while she was in the MRI scanner office. She was witnessed as having brief loss of consciousness with some transient jerking lasting less than a minute. She was quickly back to her baseline and evaluated with Dr. [place] lie but complained of some numbness in the right side of the cheek. She had not yet started Keppra and was advised to do so. She however states that she's had for further episodes despite being on Keppra. She has noticed severe headache following one of these. She has also developed daily headaches now for the last couple of months. She  takes up to 9 ibuprofen tablets daily. She describes the headache as being bifrontal dull 6/10 in severity. It is compared nausea and occasional light sensitivity patient denies visual symptoms with headaches. She does admit that she has been under increased stress which seems to trigger these episodes as well as makes her headache worse. She is currently not possible in any activities for stress laxation. MRI scan of the brain done on 11/01/13 personally reviewed by me appears normal. EEG done on 11/01/13 is also normal and both results were discussed with the patient and her  husband . ROS:   14 system review of systems is positive for  passing out, tiredness, sleepiness, fatigue, anxiety, decreased energy, numbness, seizure, tremors, facial drooping, confusion and headache.  PMH:  Past Medical History  Diagnosis Date  . Migraine   . Depression   . Anxiety     Social History:  History   Social History  . Marital Status: Married    Spouse Name: jack    Number of Children: 1  . Years of Education: college   Occupational History  . [employer]    Social History Main Topics  . Smoking status: Current Every Day Smoker -- 1.50 packs/day for 25 years    Types: Cigarettes  . Smokeless tobacco: Never Used  . Alcohol Use: Yes     Comment: rare glass of wine  . Drug Use: No  . Sexual Activity: Yes    Birth Control/ Protection: Surgical   Other Topics Concern  . Not on file   Social History Narrative  . No narrative on file    Medications:   Current Outpatient Prescriptions on File Prior to Visit  Medication Sig Dispense Refill  . ALPRAZolam (XANAX) 0.5 MG tablet take 1 tablet by mouth once daily if needed for anxiety  30 tablet  3  . MINIVELLE 0.05 MG/24HR patch Place 1 patch onto the skin 2 (two) times a week.       . sertraline (ZOLOFT) 100 MG tablet TAKE ONE TABLET BY MOUTH ONCE DAILY  30 tablet  5  . traZODone (DESYREL) 100 MG tablet TAKE THREE TABLETS BY MOUTH AT BEDTIME  90 tablet  5   No current facility-administered medications on file prior to visit.    Allergies:  No Known Allergies  Physical Exam General: well developed, well nourished, seated, in no evident distress Head: head normocephalic and atraumatic. Orohparynx benign Neck: supple with no carotid or supraclavicular bruits Cardiovascular: regular rate and rhythm, no murmurs Musculoskeletal: no deformity Skin:  no rash/petichiae Vascular:  Normal pulses all extremities  Neurologic Exam Mental Status: Awake and fully alert. Oriented to place and time. Recent and remote  memory intact. Attention span, concentration and fund of knowledge appropriate. Mood and affect appropriate.  Cranial Nerves: Fundoscopic exam not done  . Pupils equal, briskly reactive to light. Extraocular movements full without nystagmus. Visual fields full to confrontation. Hearing intact. Facial sensation intact. Face, tongue, palate moves normally and symmetrically.  Motor: Normal bulk and tone. Normal strength in all tested extremity muscles. Sensory.: intact to touch and pinprick and vibratory sensation.  Coordination: Rapid alternating movements normal in all extremities. Finger-to-nose and heel-to-shin performed accurately bilaterally. Gait and Station: Arises from chair without difficulty. Stance is normal. Gait demonstrates normal stride length and balance . Able to heel, toe and tandem walk without difficulty.  Reflexes: 1+ and symmetric. Toes downgoing.       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 Long-standing history of transient episodes of brief loss of consciousness syncope versus complex partial seizures. Brain imaging and EEG are both normal. Significant underlying long-standing anxiety. New complaints of tension headaches with an analgesic rebound    PLAN:  I had a long discussion with the patient and her husband regarding her daily headaches likely being tension headaches with an analgesic rebound. I advised her to discontinue ibuprofen and use Tylenol instead but limit to not more than 2 or 3 days per week. Discontinue Keppra as it's not working instead try Depakote ER 500 twice daily to help with headache as well as possible seizure episodes. I also advised her to increase participation in activities for stress relaxation like regular exercise, swimming, medication for you. Return for followup in 2 months or earlier if  necessary.    Note: This document was prepared with digital dictation and possible smart phrase technology. Any transcriptional errors that result from this process are unintentional.

## 2014-02-28 ENCOUNTER — Ambulatory Visit: Payer: 59 | Admitting: Neurology

## 2014-03-01 ENCOUNTER — Encounter: Payer: Self-pay | Admitting: Neurology

## 2014-03-01 ENCOUNTER — Ambulatory Visit (INDEPENDENT_AMBULATORY_CARE_PROVIDER_SITE_OTHER): Payer: 59 | Admitting: Neurology

## 2014-03-01 ENCOUNTER — Encounter: Payer: Self-pay | Admitting: *Deleted

## 2014-03-01 VITALS — BP 110/68 | HR 73 | Ht 65.0 in | Wt 167.4 lb

## 2014-03-01 DIAGNOSIS — R569 Unspecified convulsions: Secondary | ICD-10-CM

## 2014-03-01 MED ORDER — LEVETIRACETAM 750 MG PO TABS: 750.0000 mg | ORAL_TABLET | Freq: Two times a day (BID) | ORAL | Status: DC

## 2014-03-01 NOTE — Patient Instructions (Signed)
I had a long discussion with the patient and her husband regarding her episodes of possible complex partial seizures and recommended increasing the Keppra to 750 mg twice daily. She was also advised to avoid seizure provoking stimuli like medication noncompliance, sleep deprivation, significant stress. Return for follow-up in 3 months or call earlier if necessary.

## 2014-03-01 NOTE — Progress Notes (Signed)
[facility] [address] [place]. 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  . Brain imaging and EEG are both normal. Significant underlying long-standing anxiety.   Update 12/18/2013 : She returns for followup of the initial consult on 10/21/13. She states she continues to have episodes of possible seizures. She had 5 such episodes since last visit. One of them occurred while she was in the MRI scanner office. She was witnessed as having brief loss of consciousness with some transient jerking lasting less than a minute. She was quickly back to her baseline and evaluated with Dr. [place] lie but complained of some numbness in the right side of the cheek. She had not yet started Keppra and was advised to do so. She however states that she's had for further episodes despite being on Keppra. She has noticed severe headache following one of these. She has also developed daily headaches now for the last couple of months. She takes up to 9 ibuprofen tablets daily. She describes the headache as being bifrontal dull 6/10 in severity. It is compared nausea and occasional light sensitivity patient denies visual symptoms with headaches. She does admit that she has been under increased stress which seems to trigger these episodes as well as makes her headache worse. She is currently not possible in any activities for stress laxation. MRI scan of the brain done on 11/01/13 personally reviewed by me appears normal. EEG done on 11/01/13 is also normal and both results were discussed with the patient and her husband . UPDATE 03/01/2014 ; She returns for  follow-up after last visit 2-1/2 months ago. She was doing quite well on Keppra 500 twice a day until last week when she had 3 brief episodes of possible seizures. She states now that she has an aura and can feel of episodes coming along and she takes some Xanax she can stop the episodes. The patient was prescribed Depakote by me at last visit but she read the side effects and got scared and did not start it. Her husband seems to feel that significant stressor over exertion seems to trigger these episodes. She is tolerating Keppra quite well without significant side effects. She has no other new complaints today ROS:   14 system review of systems is positive for no complaints today except seizures and aura PMH:  Past Medical History  Diagnosis Date  . Migraine   . Depression   . Anxiety     Social History:  History   Social History  . Marital Status: Married    Spouse Name: jack    Number of Children: 1  . Years of Education: college   Occupational History  . [employer]    Social History Main Topics  . Smoking status: Current Every Day Smoker -- 1.50 packs/day for 25 years    Types: Cigarettes  . Smokeless tobacco: Never Used  . Alcohol Use: 0.0 oz/week    0 Not specified per week     Comment: rare glass of wine  . Drug Use: No  . Sexual Activity: Yes    Birth Control/ Protection: Surgical   Other  Topics Concern  . Not on file   Social History Narrative   Patient is married with one child.   Patient is right handed.   Patient has college education.   Patient drinks 1 glass of tea daily.    Medications:   Current Outpatient Prescriptions on File Prior to Visit  Medication Sig Dispense Refill  . ALPRAZolam (XANAX) 0.5 MG tablet take 1 tablet by mouth once daily if needed for anxiety 30 tablet 1  . sertraline (ZOLOFT) 100 MG tablet TAKE ONE TABLET BY MOUTH ONCE DAILY 30 tablet 5  . traZODone (DESYREL) 100 MG tablet TAKE THREE TABLETS BY MOUTH AT BEDTIME 90 tablet 5   No  current facility-administered medications on file prior to visit.    Allergies:  No Known Allergies Filed Vitals:   03/01/14 1510  BP: 110/68  Pulse: 73    Physical Exam General: well developed, well nourished middle aged Caucasian lady, seated, in no evident distress Head: head normocephalic and atraumatic. Orohparynx benign Neck: supple with no carotid or supraclavicular bruits Cardiovascular: regular rate and rhythm, no murmurs Musculoskeletal: no deformity Skin:  no rash/petichiae Vascular:  Normal pulses all extremities  Neurologic Exam Mental Status: Awake and fully alert. Oriented to place and time. Recent and remote memory intact. Attention span, concentration and fund of knowledge appropriate. Mood and affect appropriate.  Cranial Nerves: Fundoscopic exam not done  . Pupils equal, briskly reactive to light. Extraocular movements full without nystagmus. Visual fields full to confrontation. Hearing intact. Facial sensation intact. Face, tongue, palate moves normally and symmetrically.  Motor: Normal bulk and tone. Normal strength in all tested extremity muscles. Sensory.: intact to touch and pinprick and vibratory sensation.  Coordination: Rapid alternating movements normal in all extremities. Finger-to-nose and heel-to-shin performed accurately bilaterally. Gait and Station: Arises from chair without difficulty. Stance is normal. Gait demonstrates normal stride length and balance . Able to heel, toe and tandem walk without difficulty.  Reflexes: 1+ and symmetric. Toes downgoing.       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  . Brain imaging and EEG are both normal. Significant underlying long-standing anxiety.    PLAN:   I had a long discussion with the patient and her husband regarding her episodes of possible complex partial  seizures and recommended increasing the Keppra to 750 mg twice daily. She was also advised to avoid seizure provoking stimuli like medication noncompliance, sleep deprivation, significant stress. Return for follow-up in 3 months or call earlier if necessary.  Note: This document was prepared with digital dictation and possible smart phrase technology. Any transcriptional errors that result from this process are unintentional.

## 2014-03-02 ENCOUNTER — Ambulatory Visit (INDEPENDENT_AMBULATORY_CARE_PROVIDER_SITE_OTHER): Payer: 59 | Admitting: Nurse Practitioner

## 2014-03-02 ENCOUNTER — Encounter: Payer: Self-pay | Admitting: Nurse Practitioner

## 2014-03-02 VITALS — BP 118/80 | Ht 66.0 in | Wt 164.0 lb

## 2014-03-02 DIAGNOSIS — F418 Other specified anxiety disorders: Secondary | ICD-10-CM

## 2014-03-02 DIAGNOSIS — F329 Major depressive disorder, single episode, unspecified: Secondary | ICD-10-CM

## 2014-03-02 DIAGNOSIS — Z23 Encounter for immunization: Secondary | ICD-10-CM

## 2014-03-02 DIAGNOSIS — F419 Anxiety disorder, unspecified: Secondary | ICD-10-CM

## 2014-03-02 DIAGNOSIS — F32A Depression, unspecified: Secondary | ICD-10-CM

## 2014-03-02 MED ORDER — ALPRAZOLAM 0.5 MG PO TABS: ORAL_TABLET | ORAL | Status: DC

## 2014-03-02 MED ORDER — SERTRALINE HCL 100 MG PO TABS: ORAL_TABLET | ORAL | Status: DC

## 2014-03-02 MED ORDER — TRAZODONE HCL 100 MG PO TABS: ORAL_TABLET | ORAL | Status: DC

## 2014-03-06 ENCOUNTER — Encounter: Payer: Self-pay | Admitting: Nurse Practitioner

## 2014-03-06 NOTE — Progress Notes (Signed)
Subjective:  Presents for recheck on anxiety and depression. Has been diagnosed with seizures which appear to be stress related. Taking 1/2 xanax in the morning and one at night which helps her sleep. Seems to reduce her risk of seizures. Doing well on Trazodone and Zoloft.  Objective:   BP 118/80 mmHg  Ht 5\' 6"  (1.676 m)  Wt 164 lb (74.39 kg)  BMI 26.48 kg/m2 NAD. Alert, oriented. Mildly anxious affect. Lungs clear. Heart RRR.   Assessment:  Problem List Items Addressed This Visit      Other   Anxiety and depression    Other Visit Diagnoses    Need for immunization against influenza    -  Primary    Relevant Orders       Flu Vaccine QUAD 36+ mos PF IM (Fluarix Quad PF) (Completed)      Plan:  Meds ordered this encounter  Medications  . ALPRAZolam (XANAX) 0.5 MG tablet    Sig: 1/2 po qam then one po qhs    Dispense:  45 tablet    Refill:  5    May refill monthly    Order Specific Question:  Supervising Provider    Answer:  [name] [[id number]]  . sertraline (ZOLOFT) 100 MG tablet    Sig: TAKE ONE TABLET BY MOUTH ONCE DAILY    Dispense:  30 tablet    Refill:  5    Order Specific Question:  Supervising Provider    Answer:  [name] [[id number]]  . traZODone (DESYREL) 100 MG tablet    Sig: TAKE THREE TABLETS BY MOUTH AT BEDTIME    Dispense:  90 tablet    Refill:  5    Order Specific Question:  Supervising Provider    Answer:  [name] [[id number]]   Return in about 6 months (around 08/31/2014). Call back sooner if needed.

## 2014-06-06 ENCOUNTER — Ambulatory Visit (INDEPENDENT_AMBULATORY_CARE_PROVIDER_SITE_OTHER): Payer: 59 | Admitting: Neurology

## 2014-06-06 ENCOUNTER — Telehealth: Payer: Self-pay | Admitting: Neurology

## 2014-06-06 ENCOUNTER — Ambulatory Visit: Payer: 59 | Admitting: Neurology

## 2014-06-06 ENCOUNTER — Encounter: Payer: Self-pay | Admitting: Neurology

## 2014-06-06 VITALS — BP 118/70 | HR 78 | Ht 66.0 in | Wt 169.0 lb

## 2014-06-06 DIAGNOSIS — R569 Unspecified convulsions: Secondary | ICD-10-CM

## 2014-06-06 NOTE — Patient Instructions (Signed)
Overall you are doing fairly well but I do want to suggest a few things today:   Remember to drink plenty of fluid, eat healthy meals and do not skip any meals. Try to eat protein with a every meal and eat a healthy snack such as fruit or nuts in between meals. Try to keep a regular sleep-wake schedule and try to exercise daily, particularly in the form of walking, 20-30 minutes a day, if you can.   As far as your medications are concerned, I would like to suggest: Stay on the current medications  As far as diagnostic testing: Extended EEG monitoring  I would like to see you back in 4-6 months, sooner if we need to. Please call us with any interim questions, concerns, problems, updates or refill requests.   Please also call us for any test results so we can go over those with you on the phone.  My clinical assistant and will answer any of your questions and relay your messages to me and also relay most of my messages to you.   Our phone number is [phone]. We also have an after hours call service for urgent matters and there is a physician on-call for urgent questions. For any emergencies you know to call 911 or go to the nearest emergency room

## 2014-06-06 NOTE — Telephone Encounter (Signed)
Patient would like a cc of the email being sent to her employer emailed to:  [email or web address]

## 2014-06-06 NOTE — Progress Notes (Addendum)
[facility]    Provider:  Dr Jaynee Eagles Referring Provider: [name], MD Primary Care Physician:  Rubbie Battiest, MD  CC:  Seizures  Addendum 08/09/2014: I called the patient tonight. EEG results were normal. Patient had 2 events while wearing the long-term 72 hour EEG. There was no epileptiform activity during these 2 events or at any time over the EEG recording. 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 Patient was not home. Left a message that the results were normal. Asked her to call back so we could discuss.   HPI:  Sharon Berg is a 49 y.o. female here as a referral from Dr. Wolfgang [place] for Seizures. PMHx migraines, anxiety, significant recent stressors, she is crying in the office today. She started having seizures in June of 2015. Workup was negative, she was placed on Keppra and is on 750mg  twice daily. The seizures are less frequent but still occurring multiple times weekly. She does not want to increase her keppra, she is sleepy. She is having memory problems. That started in June of last years with the seizures and progressively getting worse. She drives 50 miles one way and 2 days in a row she forgot her turn and she panicked and didn't know where she was. When she has conversations at work, sometimes she has to search for her words. She knows what she wants to say but it isn't coming out. She has to make lists to remember things. She takes 0.5 xanax a day for anciety. She takes keppra twice a day. She takes the sertraline and trazodone for depresison. MRI of the brain was normal last July. She is having one seizure a week at least. EEG was also normal last July.   Decsription of the seizures: right side of the face droops, she drools, her body tenses up. and she is aware of it happening. Her husband tries to talk her through it, it wipes her out, she feels like she has been in a fight. The seizures last 5-10 minutes, no urination, no tongue biting. She  is confused afterwards. The Keppra has helped. The stress corresponded with the start of the seizures. During the seizures she can't talk. Her dogs know when she is having a seizure. She is still driving because she know when the seizures are coming on. She gets confused in the car. The last seizure was last Wednesday.She knows they are coming and they sit down, she can always make it back to her chair.  The first seizures was "violent". She called her mother and she she thought she was having a stroke. She lost consciousness "some" she remembers the paramedics strapping her in a chair to get her in a house and put her on the ambulance, she remembers having body jerks, she also also aware when her body starts jerking. She has chronic migraines as well.   Reviewed notes, labs and imaging from outside physicians, which showed: MRI of th ebrain wo contrast 10/2013 was normal. (personally reviewed images and agree with findings).  Ambulatory EEG normal.   Update 12/18/2013 Dr. Leonie Man : She returns for followup of the initial consult on 10/21/13. She states she continues to have episodes of possible seizures. She had 5 such episodes since last visit. One of them occurred while she was in the MRI scanner office. She was witnessed as having brief loss of consciousness with some transient jerking lasting less than a minute. She was quickly back to her baseline and evaluated with  Dr. [place] lie but complained of some numbness in the right side of the cheek. She had not yet started Keppra and was advised to do so. She however states that she's had for further episodes despite being on Keppra. She has noticed severe headache following one of these. She has also developed daily headaches now for the last couple of months. She takes up to 9 ibuprofen tablets daily. She describes the headache as being bifrontal dull 6/10 in severity. It is compared nausea and occasional light sensitivity patient denies visual symptoms with  headaches. She does admit that she has been under increased stress which seems to trigger these episodes as well as makes her headache worse. She is currently not possible in any activities for stress laxation. MRI scan of the brain done on 11/01/13 personally reviewed by me appears normal. EEG done on 11/01/13 is also normal and both results were discussed with the patient and her husband . UPDATE 03/01/2014 Dr. Leonie Man ; She returns for follow-up after last visit 2-1/2 months ago. She was doing quite well on Keppra 500 twice a day until last week when she had 3 brief episodes of possible seizures. She states now that she has an aura and can feel of episodes coming along and she takes some Xanax she can stop the episodes. The patient was prescribed Depakote by me at last visit but she read the side effects and got scared and did not start it. Her husband seems to feel that significant stressor over exertion seems to trigger these episodes. She is tolerating Keppra quite well without significant side effects. She has no other new complaints today  Review of Systems: Patient complains of symptoms per HPI as well as the following symptoms: ringing in the ears, fatigue, drooling, blurred vision, nausea, snoring, daytime sleepiness, memory loss, dizziness, seizure, headache, weakness, depression, nervous/anxiety. Pertinent negatives per HPI. All others negative.   History   Social History  . Marital Status: Married    Spouse Name: [name]  . Number of Children: 1  . Years of Education: College   Occupational History  . [employer]    Social History Main Topics  . Smoking status: Current Every Day Smoker -- 1.50 packs/day for 25 years    Types: Cigarettes  . Smokeless tobacco: Never Used  . Alcohol Use: No  . Drug Use: No  . Sexual Activity: Yes    Birth Control/ Protection: Surgical   Other Topics Concern  . Not on file   Social History Narrative   Patient is married with one child.   Patient is  right handed.   Patient has college education.   Caffeine use: Patient drinks 1 glass of tea daily.    Family History  Problem Relation Age of Onset  . Hypertension Mother   . Heart disease Maternal Grandmother   . Stroke Maternal Grandmother   . Heart disease Maternal Grandfather   . Dementia Paternal Grandmother   . Dementia Paternal Grandfather     Past Medical History  Diagnosis Date  . Migraine   . Depression   . Anxiety     Past Surgical History  Procedure Laterality Date  . Abdominal hysterectomy  2000    Current Outpatient Prescriptions  Medication Sig Dispense Refill  . ALPRAZolam (XANAX) 0.5 MG tablet 1/2 po qam then one po qhs 45 tablet 5  . levETIRAcetam (KEPPRA) 750 MG tablet Take 1 tablet (750 mg total) by mouth 2 (two) times daily. 180 tablet 3  .  sertraline (ZOLOFT) 100 MG tablet TAKE ONE TABLET BY MOUTH ONCE DAILY 30 tablet 5  . traZODone (DESYREL) 100 MG tablet TAKE THREE TABLETS BY MOUTH AT BEDTIME 90 tablet 5   No current facility-administered medications for this visit.    Allergies as of 06/06/2014  . (No Known Allergies)    Vitals: BP 118/70 mmHg  Pulse 78  Ht 5\' 6"  (1.676 m)  Wt 169 lb (76.658 kg)  BMI 27.29 kg/m2 Last Weight:  Wt Readings from Last 1 Encounters:  06/06/14 169 lb (76.658 kg)   Last Height:   Ht Readings from Last 1 Encounters:  06/06/14 5\' 6"  (1.676 m)   Physical exam: Exam: Gen: NAD, conversant, well nourised, well groomed                     CV: RRR, no MRG. No Carotid Bruits. No peripheral edema, warm, nontender Eyes: Conjunctivae clear without exudates or hemorrhage  Neuro: Detailed Neurologic Exam  Speech:    Speech is normal; fluent and spontaneous with normal comprehension.  Cognition:    The patient is oriented to person, place, and time;     recent and remote memory intact;     language fluent;     normal attention, concentration,     fund of knowledge Cranial Nerves:    The pupils are equal,  round, and reactive to light. The fundi are normal and spontaneous venous pulsations are present. Visual fields are full to finger confrontation. Extraocular movements are intact. Trigeminal sensation is intact and the muscles of mastication are normal. The face is symmetric. The palate elevates in the midline. Hearing intact. Voice is normal. Shoulder shrug is normal. The tongue has normal motion without fasciculations.   Coordination:    Normal finger to nose and heel to shin. Normal rapid alternating movements.   Gait:    Heel-toe and tandem gait are normal.   Motor Observation:    No asymmetry, no atrophy, and no involuntary movements noted. Tone:    Normal muscle tone.    Posture:    Posture is normal. normal erect    Strength:    Strength is V/V in the upper and lower limbs.      Sensation: intact to LT     Reflex Exam:  DTR's:    Deep tendon reflexes in the upper and lower extremities are normal bilaterally.   Toes:    The toes are downgoing bilaterally.   Clonus:    Clonus is absent.   Assessment/Plan:  72 year Caucasian lady who with weekly episodes of brief altered consciousness with twitching, shaking followed by a prolonged recovery. 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  Brain imaging and EEG are both normal. Significant underlying long-standing anxiety and recent stressors in her life, she is crying in the office.  - Recommend extended EEG - Recommend time off from work to deal with stress, patient appears mentally fragile. Highly encouraged therapy and psychiatry - Recommended increase in [place], she declined - Told her she cannot drive legally for 6 months to one year, should she hurt someone she can be held legally responsible.  - follow up after EEG monitoring  Sarina Ill,  MD  [facility] [address] [place], [place] [address]  Phone [phone] Fax [phone]  A total of 60 minutes was spent in  with this patient. Over half this time was spent on counseling patient on the seizure diagnosis and different therapeutic options available.

## 2014-06-13 ENCOUNTER — Telehealth: Payer: Self-pay | Admitting: *Deleted

## 2014-06-13 NOTE — Telephone Encounter (Signed)
I spoke with Santiago Glad with [facility] she states that the patient is out of network for patient and they made the patient aware on Monday the 22nd. They are currently working with the insurance company to get an out of network gap

## 2014-06-13 NOTE — Telephone Encounter (Signed)
Patient calling about if her note was sent to her yet? Patient also wants to know about having the video EEG.

## 2014-06-13 NOTE — Telephone Encounter (Signed)
Sharon Berg - would you see what happened with the [facility] referral please? I will CC [place]. Please let patient know. Thank you.   Would you ask her what she means about her note? I did email her boss, she was CCed on that. Thank you.

## 2014-06-13 NOTE — Telephone Encounter (Signed)
I just received the signed form back from you guys on 2/18. I have to assume they are obtaining insurance approval. I will call and check the status

## 2014-06-14 ENCOUNTER — Telehealth: Payer: Self-pay | Admitting: *Deleted

## 2014-06-14 NOTE — Telephone Encounter (Signed)
Patient is calling wanting a copy of the letter that Dr. Jaynee Eagles emailed her boss. She feels like she needs this for her records. Please advise.

## 2014-06-14 NOTE — Telephone Encounter (Signed)
She was CCed on the email to her boss. She should have the letter in her email. Please let her know thank you. thanks

## 2014-06-14 NOTE — Telephone Encounter (Signed)
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 She is wondering if Dr. Jaynee Eagles can forward the original email to her address at [email or web address] and her boss at [email or web address]. I told her I would let Dr. Jaynee Eagles know and see what we can do. The patient wants the notes from the appointment and when she can return back to work.

## 2014-06-18 ENCOUNTER — Encounter: Payer: Self-pay | Admitting: *Deleted

## 2014-06-18 ENCOUNTER — Telehealth: Payer: Self-pay | Admitting: *Deleted

## 2014-06-18 NOTE — Telephone Encounter (Signed)
Talked with patient about email per Dr. Jaynee Eagles. She received the email. I Also told her I mailed her the most recent office notes for her records. Patient verbalized understanding.

## 2014-06-18 NOTE — Telephone Encounter (Signed)
Patient returning Sharon Dupont, Rn's call.

## 2014-06-18 NOTE — Telephone Encounter (Signed)
Please let patient know that I forwarded the email to her again. The email was originally send to [employer] as well as to her home email address(see your email, I cced you). The email states she can return to work on the 29th however if she is still experiencing significant symptoms and does not feel she can go back to work, she should let us know.

## 2014-06-18 NOTE — Telephone Encounter (Signed)
Left a message for the patient to call us back. Gave [facility] phone number.

## 2014-07-02 ENCOUNTER — Telehealth: Payer: Self-pay | Admitting: Neurology

## 2014-07-02 ENCOUNTER — Telehealth: Payer: Self-pay | Admitting: *Deleted

## 2014-07-02 ENCOUNTER — Other Ambulatory Visit: Payer: Self-pay | Admitting: Neurology

## 2014-07-02 MED ORDER — LEVETIRACETAM 1000 MG PO TABS: 1000.0000 mg | ORAL_TABLET | Freq: Two times a day (BID) | ORAL | Status: DC

## 2014-07-02 NOTE — Telephone Encounter (Signed)
Talked with patient and she stated she had a seizure yesterday that lasted 6-7 minutes. She said her grandson laid her down on the floor on her side and she does not remember anything after that until her seizure was done. She stated her husband said she was shaking and her eyes were open the whole time. Patient states her head hurts and her face is numb, lips and eye are numb. She said she does have the extended EEG scheduled at her house on July 27, 2014. I told her I would let Dr. Jaynee Eagles know and we would call her back by the morning. Pt verbalized understanding.

## 2014-07-02 NOTE — Telephone Encounter (Addendum)
Patient is returning Sharon Berg's call today.  She states she had a bad seizure.  Thanks

## 2014-07-02 NOTE — Telephone Encounter (Signed)
Let's increase her Keppra to 1000mg  twice daily. I will order it. Remind her that she is not supposed to be driving. If they can get a video of the seizure on one of their phones, it would be very helpful.  Thank you.

## 2014-07-03 ENCOUNTER — Telehealth: Payer: Self-pay | Admitting: *Deleted

## 2014-07-03 NOTE — Telephone Encounter (Signed)
Talked with patient and told her to take Keppra 1000 mg twice a day per Dr. Jaynee Eagles instructions. I also reminded her to not drive at this time and to try and get her seizure activity on video if possible for Dr. Jaynee Eagles to see. Pt verbalized understanding.

## 2014-07-03 NOTE — Telephone Encounter (Signed)
Talked with pt to let her know Dr. Jaynee Eagles sent a new prescription over the her pharmacy for 1000 mg Keppra twice/day. Also talked with her again about not driving and patient stated she drives to work back and forth about an hour each way. I advised her that for the safety of others and herself, Dr. Jaynee Eagles would like her not to drive due to her seizure activity. Pt stated she does not have any one else to drive her and she might have to take leave from work and will need a doctor's note from Dr. Jaynee Eagles. I told her I would talk with Dr. Jaynee Eagles. Pt verbalized understanding.

## 2014-07-04 ENCOUNTER — Telehealth: Payer: Self-pay | Admitting: *Deleted

## 2014-07-04 ENCOUNTER — Telehealth: Payer: Self-pay | Admitting: Neurology

## 2014-07-04 ENCOUNTER — Encounter: Payer: Self-pay | Admitting: Neurology

## 2014-07-04 NOTE — Telephone Encounter (Signed)
Hope Pigeon, RN at 07/04/2014 4:37 PM     Status: Signed       Expand All Collapse All   Faxed over Lyrica 75mg  Rx to pt pharmacy at 4:35 pm.          Charted in error on wrong patient. This was not sent for Sharon Berg.

## 2014-07-04 NOTE — Telephone Encounter (Signed)
Patient is calling because she is having twitching in her face and eye and not sure what to do. Please call. Thank you.

## 2014-07-04 NOTE — Telephone Encounter (Signed)
She says her face is twitching. She has not started the increase in [place]. Increased to 1000mg  bid. Husband was able to safely videotape an episode, there are others around to care for the patient. He will send it to me to view.

## 2014-07-04 NOTE — Telephone Encounter (Signed)
Faxed over Lyrica  75mg  Rx to pt pharmacy at 4:35 pm.

## 2014-07-10 ENCOUNTER — Telehealth: Payer: Self-pay | Admitting: Neurology

## 2014-07-10 NOTE — Telephone Encounter (Signed)
Chelsey with [employer] is calling and would like to speak with you about Cieara's need to stay out of work.  Please call her @[phone].

## 2014-07-12 NOTE — Telephone Encounter (Signed)
Sharon Berg - Would you please call this person back and let her know that I cannot speak to her unless I have a signed HIPAA form. Do we have that? Please let me know, thank you.

## 2014-07-13 ENCOUNTER — Telehealth: Payer: Self-pay | Admitting: *Deleted

## 2014-07-13 NOTE — Telephone Encounter (Signed)
Left a message for Sharon Berg from [facility] to call us back regarding pt. I explained she needs to have pt sign a hippa form before we can talk about her need to stay out of work. Gave [facility] phone number and office hours.

## 2014-07-16 ENCOUNTER — Telehealth: Payer: Self-pay | Admitting: *Deleted

## 2014-07-16 NOTE — Telephone Encounter (Signed)
Patient form from [facility] on [place].

## 2014-07-17 ENCOUNTER — Telehealth: Payer: Self-pay | Admitting: Neurology

## 2014-07-17 ENCOUNTER — Telehealth: Payer: Self-pay | Admitting: *Deleted

## 2014-07-17 DIAGNOSIS — Z0289 Encounter for other administrative examinations: Secondary | ICD-10-CM

## 2014-07-17 NOTE — Telephone Encounter (Signed)
Patient is calling to let you know she dropped off FMLA paperwork on 3/28 am. She states that she also brought in signed HIPPA form for her company disability insurance [employer].  Please call patient when finished.  Thanks!

## 2014-07-17 NOTE — Telephone Encounter (Signed)
Talked with pt and gave her the fax number for [facility]. Gave her [phone]. Pt verbalized understanding.

## 2014-07-17 NOTE — Telephone Encounter (Signed)
See phone note. thanks

## 2014-07-17 NOTE — Telephone Encounter (Signed)
Spoke to patient. She had another seizure-like episode. It was this past Sunday. She was at [place] at her mother's house, just having family time. Her face started jerking, her face is still numb and her lips are numb on that side. Right side started jerking. She was sitting. She felt it coming on. Niece "talked her through it". No loss of consciousness. She remembers the whole event. She can't talk, she can hear people but cant respond. Lasted 3 minutes. She felt very tired afterwards, she has slept the last few days.   She just started the increased Keppra to 1000mg  twice daily.

## 2014-07-18 ENCOUNTER — Telehealth: Payer: Self-pay | Admitting: *Deleted

## 2014-07-18 ENCOUNTER — Encounter: Payer: Self-pay | Admitting: Neurology

## 2014-07-18 NOTE — Telephone Encounter (Signed)
Patient checking status of paperwork.  Please call and advise.

## 2014-07-18 NOTE — Telephone Encounter (Signed)
Placed them on your desk earlier, thanks

## 2014-07-18 NOTE — Telephone Encounter (Signed)
Spoke with pt and she said she emailed Dr. Jaynee Eagles her job description last night and I notified Dr. Jaynee Eagles. Pt requested if she can pick up forms this week. I told her I would let her know once the forms are completed. Pt asked to have forms faxed to her HR department at work at [phone] to [facility].

## 2014-07-19 NOTE — Telephone Encounter (Signed)
I am going to write a letter in a few minutes regarding Ms [place]. Would you please fax to [phone]. C/o [place].

## 2014-07-19 NOTE — Telephone Encounter (Signed)
I faxed letter to [facility] per Dr. Jaynee Eagles request.

## 2014-07-19 NOTE — Telephone Encounter (Signed)
Sharon Berg - there is a letter out there that I just created discussing her events.  I can't sign it so just fax it as it and we will see what they say. See my previous note for the fax number. Thanks.

## 2014-07-27 DIAGNOSIS — R569 Unspecified convulsions: Secondary | ICD-10-CM | POA: Diagnosis not present

## 2014-07-28 DIAGNOSIS — R569 Unspecified convulsions: Secondary | ICD-10-CM | POA: Diagnosis not present

## 2014-07-29 DIAGNOSIS — R569 Unspecified convulsions: Secondary | ICD-10-CM | POA: Diagnosis not present

## 2014-08-01 ENCOUNTER — Telehealth: Payer: Self-pay | Admitting: Neurology

## 2014-08-01 NOTE — Telephone Encounter (Signed)
Talked with pt to let her know we are contacting [facility] to get EEG results and to have a doctor read the report and we would call her back once that is completed. Pt verbalized understanding.  She also stated she had an episode while on the EEG monitoring on Saturday morning at 10am.

## 2014-08-01 NOTE — Telephone Encounter (Signed)
Patient requesting EEG results.  Please call and advise.

## 2014-08-03 NOTE — Telephone Encounter (Signed)
Pt is calling back to check the status of her EEG results.  Please call and advise.

## 2014-08-03 NOTE — Telephone Encounter (Signed)
Looks like [employer] are reading it now, but it takes a long time to read 3 days of EEG.  may have results back tomorrow morning. Will call as soon as possible.

## 2014-08-03 NOTE — Telephone Encounter (Signed)
Talked with patient to let her know EEG was still being reviewed today and Dr. Jaynee Eagles will give her a call tomorrow about the results. Pt verbalized understanding.

## 2014-08-06 ENCOUNTER — Telehealth: Payer: Self-pay | Admitting: Neurology

## 2014-08-06 NOTE — Telephone Encounter (Signed)
Pt is calling to request EEG results, she called on 4/15 and was told Dr. Jaynee Eagles would call her back. Please call and advise.

## 2014-08-06 NOTE — Telephone Encounter (Signed)
Spoke with patient and informed her that we are working on getting results from [place] (EEG results) . Pt understanding and wants [place] to call her back on cell phone tomorrow due to her being at work.

## 2014-08-06 NOTE — Telephone Encounter (Signed)
Patient called back following back on the EEG results. I informed the patient that the results were not in yet and I also relayed the telephone note from Dr. Jaynee Eagles, Juluis Rainier.

## 2014-08-06 NOTE — Telephone Encounter (Signed)
I have spoken with [facility] 5 times today. I don't know what else to do. The preliminary tech report is done. Their medical director is reading the EEG now so they say I should have it by the morning.

## 2014-08-06 NOTE — Telephone Encounter (Signed)
Let her know I checked Saturday and Sunday. This is ridiculous. I am going to have our senior managing partner call the company.

## 2014-08-07 NOTE — Telephone Encounter (Signed)
I called Sharon Berg. Left a message on her cell.  [phone]. I apologized. Still awaiting results from 72 hour EEG study from [facility].

## 2014-08-09 NOTE — Telephone Encounter (Signed)
I called the patient tonight. EEG results were normal. Patient had 2 events while wearing the long-term 72 hour EEG. There was no epileptiform activity during these 2 events or at any time over the EEG recording. 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 Patient was not home. Left a message that the results were normal. Asked her to call back so we could discuss.

## 2014-08-27 ENCOUNTER — Other Ambulatory Visit: Payer: Self-pay | Admitting: Nurse Practitioner

## 2014-08-29 ENCOUNTER — Ambulatory Visit: Payer: 59 | Admitting: Nurse Practitioner

## 2014-08-30 ENCOUNTER — Ambulatory Visit (INDEPENDENT_AMBULATORY_CARE_PROVIDER_SITE_OTHER): Payer: 59 | Admitting: Nurse Practitioner

## 2014-08-30 ENCOUNTER — Encounter: Payer: Self-pay | Admitting: Nurse Practitioner

## 2014-08-30 VITALS — BP 122/82 | Ht 66.0 in | Wt 163.4 lb

## 2014-08-30 DIAGNOSIS — F419 Anxiety disorder, unspecified: Principal | ICD-10-CM

## 2014-08-30 DIAGNOSIS — F418 Other specified anxiety disorders: Secondary | ICD-10-CM

## 2014-08-30 DIAGNOSIS — F329 Major depressive disorder, single episode, unspecified: Secondary | ICD-10-CM

## 2014-08-30 DIAGNOSIS — F32A Depression, unspecified: Secondary | ICD-10-CM

## 2014-08-30 MED ORDER — TRAZODONE HCL 100 MG PO TABS: 300.0000 mg | ORAL_TABLET | Freq: Every day | ORAL | Status: DC

## 2014-08-30 MED ORDER — ALPRAZOLAM 0.5 MG PO TABS: ORAL_TABLET | ORAL | Status: DC

## 2014-08-30 MED ORDER — SERTRALINE HCL 100 MG PO TABS: ORAL_TABLET | ORAL | Status: DC

## 2014-08-30 NOTE — Progress Notes (Signed)
Subjective:  Presents for recheck on her anxiety and depression. Her neurologist has told her her seizures are due to excessive stress. All of her workup has been normal. Patient has made some lifestyle changes to help her stress and anxiety symptoms. Has been on Zoloft 100 mg for a long time. Sleeping well at night with trazodone. Using Xanax very sparingly. Is off her Keppra, feels much better. Was having fatigue and achiness which have improved.  Objective:   BP 122/82 mmHg  Ht '5\' 6"'$  (1.676 m)  Wt 163 lb 6.4 oz (74.118 kg)  BMI 26.39 kg/m2 NAD. Alert, oriented. Cheerful affect. Much improved from previous visit. Thoughts logical coherent and relevant. Dressed appropriately. Lungs clear. Heart regular rate rhythm.  Assessment:  Problem List Items Addressed This Visit      Other   Anxiety and depression - Primary     Plan:  Meds ordered this encounter  Medications  . ALPRAZolam (XANAX) 0.5 MG tablet    Sig: 1/2 po qam then one po qhs    Dispense:  45 tablet    Refill:  5    May refill monthly    Order Specific Question:  Supervising Provider    Answer:  [name] [[id number]]  . DISCONTD: sertraline (ZOLOFT) 100 MG tablet    Sig: TAKE ONE TABLET BY MOUTH ONCE DAILY    Dispense:  30 tablet    Refill:  5    Order Specific Question:  Supervising Provider    Answer:  [name] [[id number]]  . traZODone (DESYREL) 100 MG tablet    Sig: Take 3 tablets (300 mg total) by mouth at bedtime.    Dispense:  90 tablet    Refill:  5    Order Specific Question:  Supervising Provider    Answer:  [name] [[id number]]  . sertraline (ZOLOFT) 100 MG tablet    Sig: TAKE ONE 1/2 TABLETS  BY MOUTH ONCE DAILY    Dispense:  45 tablet    Refill:  5    Order Specific Question:  Supervising Provider    Answer:  [name] [[id number]]   Increase Zoloft 100 mg to one and a half tabs daily. Go back to one tab per day if any problems. Continue other medications as directed. Discussed importance  of stress reduction. Return in about 6 months (around 03/02/2015) for recheck. Call back sooner if any problems.  Increase Zoloft 100 mg to one and a half tabs daily.

## 2014-09-24 ENCOUNTER — Encounter: Payer: Self-pay | Admitting: Diagnostic Neuroimaging

## 2014-10-09 ENCOUNTER — Encounter: Payer: Self-pay | Admitting: Neurology

## 2014-11-20 ENCOUNTER — Ambulatory Visit (INDEPENDENT_AMBULATORY_CARE_PROVIDER_SITE_OTHER): Payer: 59 | Admitting: Nurse Practitioner

## 2014-11-20 ENCOUNTER — Encounter: Payer: Self-pay | Admitting: Family Medicine

## 2014-11-20 ENCOUNTER — Encounter: Payer: Self-pay | Admitting: Nurse Practitioner

## 2014-11-20 VITALS — BP 100/64 | Temp 98.4°F | Ht 66.0 in | Wt 158.2 lb

## 2014-11-20 DIAGNOSIS — J00 Acute nasopharyngitis [common cold]: Secondary | ICD-10-CM

## 2014-11-20 DIAGNOSIS — R062 Wheezing: Secondary | ICD-10-CM

## 2014-11-20 DIAGNOSIS — J069 Acute upper respiratory infection, unspecified: Secondary | ICD-10-CM

## 2014-11-20 DIAGNOSIS — J208 Acute bronchitis due to other specified organisms: Secondary | ICD-10-CM

## 2014-11-20 DIAGNOSIS — B9689 Other specified bacterial agents as the cause of diseases classified elsewhere: Secondary | ICD-10-CM

## 2014-11-20 MED ORDER — METHYLPREDNISOLONE ACETATE 40 MG/ML IJ SUSP
40.0000 mg | Freq: Once | INTRAMUSCULAR | Status: AC
Start: 2014-11-20 — End: 2014-11-20
  Administered 2014-11-20: 40 mg via INTRAMUSCULAR

## 2014-11-20 MED ORDER — PREDNISONE 20 MG PO TABS: ORAL_TABLET | ORAL | Status: DC

## 2014-11-20 MED ORDER — AMOXICILLIN-POT CLAVULANATE 875-125 MG PO TABS: 1.0000 | ORAL_TABLET | Freq: Two times a day (BID) | ORAL | Status: DC

## 2014-11-20 MED ORDER — HYDROCODONE-HOMATROPINE 5-1.5 MG/5ML PO SYRP: 5.0000 mL | ORAL_SOLUTION | ORAL | Status: DC | PRN

## 2014-11-23 ENCOUNTER — Encounter: Payer: Self-pay | Admitting: Nurse Practitioner

## 2014-11-23 NOTE — Progress Notes (Signed)
Subjective:  Presents for c/o cough and congestion x 5 d. No fever. Headache. Cough especially at night. Runny nose. Green mucus. Slight wheeze at times. No sore throat or ear pain.   Objective:   BP 100/64 mmHg  Temp(Src) 98.4 F (36.9 C) (Oral)  Ht '5\' 6"'$  (1.676 m)  Wt 158 lb 4 oz (71.782 kg)  BMI 25.55 kg/m2 NAD. Alert, oriented. TMs clear effusion. Pharynx injected with green PND noted. Neck supple with mild adenopathy. Lungs posterior clear; anterior faint expiratory crackles. Heart RRR.  Assessment: Bacterial upper respiratory infection - Plan: methylPREDNISolone acetate (DEPO-MEDROL) injection 40 mg  Acute bacterial bronchitis - Plan: methylPREDNISolone acetate (DEPO-MEDROL) injection 40 mg  Wheezing - Plan: methylPREDNISolone acetate (DEPO-MEDROL) injection 40 mg  Plan:  Meds ordered this encounter  Medications  . amoxicillin-clavulanate (AUGMENTIN) 875-125 MG per tablet    Sig: Take 1 tablet by mouth 2 (two) times daily.    Dispense:  20 tablet    Refill:  0    Order Specific Question:  Supervising Provider    Answer:  [name] [[id number]]  . predniSONE (DELTASONE) 20 MG tablet    Sig: 3 po qd x 3 d then 2 po qd x 3 d then 1 po qd x 3 d; start 8/3 am    Dispense:  18 tablet    Refill:  0    Order Specific Question:  Supervising Provider    Answer:  [name] [[id number]]  . HYDROcodone-homatropine (HYCODAN) 5-1.5 MG/5ML syrup    Sig: Take 5 mLs by mouth every 4 (four) hours as needed.    Dispense:  120 mL    Refill:  0    Order Specific Question:  Supervising Provider    Answer:  [name] [[id number]]  . methylPREDNISolone acetate (DEPO-MEDROL) injection 40 mg    Sig:    Start Prednisone in 48 hours if no improvement. OTC meds as directed.  Return if symptoms worsen or fail to improve.  Reminded about preventive health physical.

## 2015-03-01 ENCOUNTER — Ambulatory Visit (INDEPENDENT_AMBULATORY_CARE_PROVIDER_SITE_OTHER): Payer: 59 | Admitting: Nurse Practitioner

## 2015-03-01 ENCOUNTER — Encounter: Payer: Self-pay | Admitting: Nurse Practitioner

## 2015-03-01 VITALS — BP 112/84 | Ht 66.0 in | Wt 152.0 lb

## 2015-03-01 DIAGNOSIS — G40209 Localization-related (focal) (partial) symptomatic epilepsy and epileptic syndromes with complex partial seizures, not intractable, without status epilepticus: Secondary | ICD-10-CM | POA: Diagnosis not present

## 2015-03-01 DIAGNOSIS — F419 Anxiety disorder, unspecified: Principal | ICD-10-CM

## 2015-03-01 DIAGNOSIS — F329 Major depressive disorder, single episode, unspecified: Secondary | ICD-10-CM

## 2015-03-01 DIAGNOSIS — N951 Menopausal and female climacteric states: Secondary | ICD-10-CM

## 2015-03-01 DIAGNOSIS — R232 Flushing: Secondary | ICD-10-CM

## 2015-03-01 DIAGNOSIS — F418 Other specified anxiety disorders: Secondary | ICD-10-CM | POA: Diagnosis not present

## 2015-03-01 DIAGNOSIS — F32A Depression, unspecified: Secondary | ICD-10-CM

## 2015-03-01 MED ORDER — ALPRAZOLAM 0.5 MG PO TABS: ORAL_TABLET | ORAL | Status: DC

## 2015-03-01 MED ORDER — TRAZODONE HCL 100 MG PO TABS: 300.0000 mg | ORAL_TABLET | Freq: Every day | ORAL | Status: DC

## 2015-03-01 MED ORDER — SERTRALINE HCL 100 MG PO TABS: ORAL_TABLET | ORAL | Status: DC

## 2015-03-01 MED ORDER — GABAPENTIN 100 MG PO CAPS: 100.0000 mg | ORAL_CAPSULE | Freq: Two times a day (BID) | ORAL | Status: DC

## 2015-03-01 NOTE — Patient Instructions (Addendum)
Quit [place] now Soy Flaxseed Black cohosh

## 2015-03-04 ENCOUNTER — Encounter: Payer: Self-pay | Admitting: Nurse Practitioner

## 2015-03-04 NOTE — Progress Notes (Signed)
Subjective:  Presents for routine follow-up. Anxiety overall is doing fairly well. Trazodone is helping with sleep. Has started having significant hot flashes at nighttime. Would like to discuss medication to help this. Also still very concerned about her recent neurologic issues which were diagnosed as a seizure disorder. Requesting a second opinion. Occasional use of Xanax for significant anxiety.  Objective:   BP 112/84 mmHg  Ht '5\' 6"'$  (1.676 m)  Wt 152 lb (68.947 kg)  BMI 24.55 kg/m2 NAD. Alert, oriented. Thoughts logical coherent and relevant. Lungs clear. Heart regular rate rhythm.  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 ALPRAZolam (XANAX) 0.5 MG tablet    Sig: 1/2 po qam then one po qhs    Dispense:  45 tablet    Refill:  5    May refill monthly    Order Specific Question:  Supervising Provider    Answer:  [name] [[id number]]  . sertraline (ZOLOFT) 100 MG tablet    Sig: TAKE ONE 1/2 TABLETS  BY MOUTH ONCE DAILY    Dispense:  45 tablet    Refill:  5    Order Specific Question:  Supervising Provider    Answer:  [name] [[id number]]  . traZODone (DESYREL) 100 MG tablet    Sig: Take 3 tablets (300 mg total) by mouth at bedtime.    Dispense:  90 tablet    Refill:  5    Order Specific Question:  Supervising Provider    Answer:  [name] [[id number]]  . gabapentin (NEURONTIN) 100 MG capsule    Sig: Take 1 capsule (100 mg total) by mouth 2 (two) times daily.    Dispense:  60 capsule    Refill:  5    Order Specific Question:  Supervising Provider    Answer:  [name] [[id number]]   Discussed options including risk associated with HRT. Patient elects to try low-dose Neurontin at bedtime to see if this will help with hot flashes. Start with  100 mg at bedtime, will slowly titrate dose as needed. Strongly recommend preventive health physical at next visit. Return in about 6 months (around 08/29/2015) for physical.

## 2015-03-25 ENCOUNTER — Telehealth: Payer: Self-pay | Admitting: Family Medicine

## 2015-03-25 MED ORDER — AMOXICILLIN 500 MG PO CAPS: 500.0000 mg | ORAL_CAPSULE | Freq: Three times a day (TID) | ORAL | Status: DC

## 2015-03-25 NOTE — Telephone Encounter (Signed)
Patient was seen on 03/01/2015 with Hoyle Sauer and had slight cold symptoms, but nothing major to have something called in.  Now she is having cough, chest congestion, headache, sinus issues, runny nose and she wants to know since she was just seen with Hoyle Sauer recently, can we just call her something in?   [facility]

## 2015-03-25 NOTE — Telephone Encounter (Signed)
amox 500 tid ten d 

## 2015-03-25 NOTE — Telephone Encounter (Signed)
Notified patient med sent to pharmacy.

## 2015-05-13 ENCOUNTER — Ambulatory Visit (INDEPENDENT_AMBULATORY_CARE_PROVIDER_SITE_OTHER): Payer: 59 | Admitting: Family Medicine

## 2015-05-13 ENCOUNTER — Encounter: Payer: Self-pay | Admitting: Family Medicine

## 2015-05-13 VITALS — BP 120/80 | Temp 98.7°F | Ht 66.0 in | Wt 152.1 lb

## 2015-05-13 DIAGNOSIS — N3000 Acute cystitis without hematuria: Secondary | ICD-10-CM

## 2015-05-13 DIAGNOSIS — R300 Dysuria: Secondary | ICD-10-CM

## 2015-05-13 LAB — POCT URINALYSIS DIPSTICK: pH, UA: 7

## 2015-05-13 MED ORDER — CIPROFLOXACIN HCL 250 MG PO TABS: 250.0000 mg | ORAL_TABLET | Freq: Two times a day (BID) | ORAL | Status: DC

## 2015-05-13 NOTE — Progress Notes (Signed)
   Subjective:    Patient ID: Sharon Berg, female    DOB: 1966-02-26, 50 y.o.   MRN: [id number]  Urinary Tract Infection  This is a new problem. The current episode started 1 to 4 weeks ago. The problem occurs every urination. The problem has been unchanged. The quality of the pain is described as burning. The pain is moderate. Associated symptoms include frequency and urgency. She has tried nothing for the symptoms. The treatment provided no relief.   Patient has no other concerns at this time.   Pos ins incr frequendy   Has had urinary tract infections in the past generally confined just a bladder without renal involvement  advil   Low gr temp mno vomiting   No major nausea Review of Systems  Genitourinary: Positive for urgency and frequency.       Objective:   Physical Exam   alert no acute distress lungs clear heart rhythm H&T normal no CVA tenderness   urine 6 white blood cells    Assessment & Plan:   impression urine checked infection plan antibiotics prescribed. Symptom care discussed WSL

## 2015-07-18 ENCOUNTER — Other Ambulatory Visit: Payer: Self-pay

## 2015-07-22 ENCOUNTER — Other Ambulatory Visit: Payer: Self-pay | Admitting: *Deleted

## 2015-07-22 MED ORDER — SERTRALINE HCL 100 MG PO TABS: ORAL_TABLET | ORAL | Status: DC

## 2015-07-23 ENCOUNTER — Other Ambulatory Visit: Payer: Self-pay

## 2015-07-23 MED ORDER — SERTRALINE HCL 100 MG PO TABS: ORAL_TABLET | ORAL | Status: DC

## 2015-08-29 ENCOUNTER — Encounter: Payer: 59 | Admitting: Nurse Practitioner

## 2015-09-03 ENCOUNTER — Telehealth: Payer: Self-pay | Admitting: Nurse Practitioner

## 2015-09-03 ENCOUNTER — Ambulatory Visit: Payer: 59 | Admitting: Nurse Practitioner

## 2015-09-03 ENCOUNTER — Other Ambulatory Visit: Payer: Self-pay | Admitting: Nurse Practitioner

## 2015-09-03 MED ORDER — ALPRAZOLAM 0.5 MG PO TABS: ORAL_TABLET | ORAL | Status: DC

## 2015-09-03 MED ORDER — TRAZODONE HCL 100 MG PO TABS: 300.0000 mg | ORAL_TABLET | Freq: Every day | ORAL | Status: DC

## 2015-09-03 NOTE — Telephone Encounter (Signed)
One month supply of meds sent to [facility]

## 2015-09-03 NOTE — Telephone Encounter (Signed)
[facility] 

## 2015-09-03 NOTE — Telephone Encounter (Signed)
[place] 5/16

## 2015-09-03 NOTE — Telephone Encounter (Signed)
Patient had to cancel her appointment for today, and rescheduled for September 19, 2015.  She says she will need a refill on her traZODone (DESYREL) 100 MG tablet and ALPRAZolam (XANAX) 0.5 MG tablet before that time.     [facility]

## 2015-09-04 NOTE — Telephone Encounter (Signed)
Spoke with patient and informed her per Linzie Collin- one month supply of trazodone, and xanax was sent into pharmacy. Patient verbalized understanding.

## 2015-09-04 NOTE — Telephone Encounter (Signed)
[place] 09/04/15 (voicemail not set up)

## 2015-09-19 ENCOUNTER — Encounter: Payer: Self-pay | Admitting: Family Medicine

## 2015-09-19 ENCOUNTER — Ambulatory Visit (INDEPENDENT_AMBULATORY_CARE_PROVIDER_SITE_OTHER): Payer: 59 | Admitting: Nurse Practitioner

## 2015-09-19 ENCOUNTER — Encounter: Payer: Self-pay | Admitting: Nurse Practitioner

## 2015-09-19 VITALS — BP 108/64 | Ht 66.0 in | Wt 149.0 lb

## 2015-09-19 DIAGNOSIS — F32A Depression, unspecified: Secondary | ICD-10-CM

## 2015-09-19 DIAGNOSIS — F419 Anxiety disorder, unspecified: Principal | ICD-10-CM

## 2015-09-19 DIAGNOSIS — Z79899 Other long term (current) drug therapy: Secondary | ICD-10-CM | POA: Diagnosis not present

## 2015-09-19 DIAGNOSIS — Z139 Encounter for screening, unspecified: Secondary | ICD-10-CM | POA: Diagnosis not present

## 2015-09-19 DIAGNOSIS — M546 Pain in thoracic spine: Secondary | ICD-10-CM | POA: Diagnosis not present

## 2015-09-19 DIAGNOSIS — F418 Other specified anxiety disorders: Secondary | ICD-10-CM

## 2015-09-19 DIAGNOSIS — N951 Menopausal and female climacteric states: Secondary | ICD-10-CM | POA: Diagnosis not present

## 2015-09-19 DIAGNOSIS — R232 Flushing: Secondary | ICD-10-CM

## 2015-09-19 DIAGNOSIS — F329 Major depressive disorder, single episode, unspecified: Secondary | ICD-10-CM

## 2015-09-19 MED ORDER — MELOXICAM 15 MG PO TABS: 15.0000 mg | ORAL_TABLET | Freq: Every day | ORAL | Status: DC

## 2015-09-19 MED ORDER — ALPRAZOLAM 0.5 MG PO TABS: ORAL_TABLET | ORAL | Status: DC

## 2015-09-19 MED ORDER — SERTRALINE HCL 100 MG PO TABS: ORAL_TABLET | ORAL | Status: DC

## 2015-09-19 MED ORDER — GABAPENTIN 100 MG PO CAPS: 100.0000 mg | ORAL_CAPSULE | Freq: Two times a day (BID) | ORAL | Status: DC

## 2015-09-19 MED ORDER — TRAZODONE HCL 100 MG PO TABS: 300.0000 mg | ORAL_TABLET | Freq: Every day | ORAL | Status: DC

## 2015-09-20 ENCOUNTER — Encounter: Payer: Self-pay | Admitting: Nurse Practitioner

## 2015-09-20 NOTE — Progress Notes (Signed)
Subjective:  Presents for routine follow-up. Currently on Zoloft 100 mg 1-1/2 tabs daily which overall has been working well. Has been taking her Xanax twice a day for breakthrough symptoms. Trazodone working well for sleep. Overall Neurontin has been working fairly well for hot flashes. Has not had a physical in a long time. Is also due for lab work. Having some tingling and tenderness along the mid back area more on the right side. No chest pain/ischemic type pain or shortness of breath. No urinary symptoms. Pain is worse with certain movements or pressure in this area. Minimal relief with OTC naproxen. Has been going on for weeks. No history of shingles.  Objective:   BP 108/64 mmHg  Ht '5\' 6"'$  (1.676 m)  Wt 149 lb (67.586 kg)  BMI 24.06 kg/m2 NAD. Alert, oriented. Lungs clear. Heart regular rate rhythm. Thoracic area posterior skin is clear. Tenderness noted along the mid thoracic area to the posterior axillary line. No erythema or warmth.  Assessment:  Problem List Items Addressed This Visit      Other   Anxiety and depression - Primary    Other Visit Diagnoses    Hot flashes        Relevant Orders    TSH    Screening        Relevant Orders    Lipid panel    Hepatic function panel    Basic metabolic panel    TSH    VITAMIN D 25 Hydroxy (Vit-D Deficiency, Fractures)    High risk medication use        Relevant Orders    Hepatic function panel    Midline thoracic back pain        Relevant Medications    meloxicam (MOBIC) 15 MG tablet       Plan:  Meds ordered this encounter  Medications  . sertraline (ZOLOFT) 100 MG tablet    Sig: TAKE 1 1/2 TABLETS  BY MOUTH ONCE DAILY    Dispense:  45 tablet    Refill:  5    Order Specific Question:  Supervising Provider    Answer:  [name] [[id number]]  . ALPRAZolam (XANAX) 0.5 MG tablet    Sig: 1/2-1 po BID prn anxiety    Dispense:  60 tablet    Refill:  5    [facility] [place]; may refill monthly    Order Specific Question:   Supervising Provider    Answer:  [name] [[id number]]  . gabapentin (NEURONTIN) 100 MG capsule    Sig: Take 1 capsule (100 mg total) by mouth 2 (two) times daily.    Dispense:  60 capsule    Refill:  5    Order Specific Question:  Supervising Provider    Answer:  [name] [[id number]]  . traZODone (DESYREL) 100 MG tablet    Sig: Take 3 tablets (300 mg total) by mouth at bedtime.    Dispense:  90 tablet    Refill:  5    Order Specific Question:  Supervising Provider    Answer:  [name] [[id number]]  . meloxicam (MOBIC) 15 MG tablet    Sig: Take 1 tablet (15 mg total) by mouth daily. Prn pain (do not take with Aleve or other NSAID)    Dispense:  30 tablet    Refill:  0    Order Specific Question:  Supervising Provider    Answer:  [name] [[id number]]   Switch to mobic for back  pain. May need to increase her gabapentin dose if symptoms persist. Continue other medications as directed. Patient has appointment for physical scheduled in mid July, recommend lab work before that visit. Increase monthly number of Xanax to 60. Return in about 6 months (around 03/20/2016) for recheck.

## 2015-10-28 LAB — LIPID PANEL
Chol/HDL Ratio: 2.9 ratio units (ref 0.0–4.4)
Cholesterol, Total: 152 mg/dL (ref 100–199)
HDL: 53 mg/dL (ref >39)
LDL CALC: 79 mg/dL (ref 0–99)
Triglycerides: 99 mg/dL (ref 0–149)
VLDL CHOLESTEROL CAL: 20 mg/dL (ref 5–40)

## 2015-10-28 LAB — HEPATIC FUNCTION PANEL
ALK PHOS: 111 IU/L (ref 39–117)
ALT: 16 IU/L (ref 0–32)
AST: 19 IU/L (ref 0–40)
Albumin: 4.5 g/dL (ref 3.5–5.5)
BILIRUBIN TOTAL: 0.2 mg/dL (ref 0.0–1.2)
BILIRUBIN, DIRECT: 0.07 mg/dL (ref 0.00–0.40)
Total Protein: 6.7 g/dL (ref 6.0–8.5)

## 2015-10-28 LAB — BASIC METABOLIC PANEL
BUN / CREAT RATIO: 23 (ref 9–23)
BUN: 17 mg/dL (ref 6–24)
CHLORIDE: 109 mmol/L — AB (ref 96–106)
CO2: 21 mmol/L (ref 18–29)
Calcium: 9.4 mg/dL (ref 8.7–10.2)
Creatinine, Ser: 0.74 mg/dL (ref 0.57–1.00)
GFR calc non Af Amer: 93 mL/min/{1.73_m2} (ref >59)
GFR, EST AFRICAN AMERICAN: 108 mL/min/{1.73_m2} (ref >59)
GLUCOSE: 116 mg/dL — AB (ref 65–99)
POTASSIUM: 4.7 mmol/L (ref 3.5–5.2)
SODIUM: 147 mmol/L — AB (ref 134–144)

## 2015-10-28 LAB — TSH: TSH: 2.71 u[IU]/mL (ref 0.450–4.500)

## 2015-10-28 LAB — VITAMIN D 25 HYDROXY (VIT D DEFICIENCY, FRACTURES): Vit D, 25-Hydroxy: 43.3 ng/mL (ref 30.0–100.0)

## 2015-10-30 ENCOUNTER — Encounter: Payer: Self-pay | Admitting: Family Medicine

## 2015-10-30 ENCOUNTER — Ambulatory Visit (INDEPENDENT_AMBULATORY_CARE_PROVIDER_SITE_OTHER): Payer: 59 | Admitting: Nurse Practitioner

## 2015-10-30 ENCOUNTER — Encounter: Payer: Self-pay | Admitting: Nurse Practitioner

## 2015-10-30 VITALS — BP 120/78 | Ht 65.5 in | Wt 150.4 lb

## 2015-10-30 DIAGNOSIS — R7301 Impaired fasting glucose: Secondary | ICD-10-CM

## 2015-10-30 DIAGNOSIS — Z01419 Encounter for gynecological examination (general) (routine) without abnormal findings: Secondary | ICD-10-CM

## 2015-10-30 DIAGNOSIS — Z0000 Encounter for general adult medical examination without abnormal findings: Secondary | ICD-10-CM | POA: Diagnosis not present

## 2015-10-30 LAB — POCT GLYCOSYLATED HEMOGLOBIN (HGB A1C): Hemoglobin A1C: 5.2

## 2015-11-02 ENCOUNTER — Encounter: Payer: Self-pay | Admitting: Nurse Practitioner

## 2015-11-02 NOTE — Progress Notes (Signed)
   Subjective:    Patient ID: Sharon Berg, female    DOB: 19-Mar-1966, 50 y.o.   MRN: [id number]  HPI presents for her wellness exam. Overall healthy diet. Same sexual partner. No vaginal discharge or pelvic pain. Regular vision and dental exams. Walks about 5 miles per day at work.     Review of Systems  Constitutional: Negative for activity change, appetite change and fatigue.  HENT: Negative for dental problem, ear pain, sinus pressure and sore throat.   Respiratory: Negative for cough, chest tightness, shortness of breath and wheezing.   Cardiovascular: Negative for chest pain.  Gastrointestinal: Negative for nausea, vomiting, abdominal pain, diarrhea, constipation and abdominal distention.  Genitourinary: Negative for dysuria, urgency, frequency, vaginal discharge, enuresis, difficulty urinating, genital sores and pelvic pain.       Objective:   Physical Exam  Constitutional: She is oriented to person, place, and time. She appears well-developed. No distress.  HENT:  Right Ear: External ear normal.  Left Ear: External ear normal.  Mouth/Throat: Oropharynx is clear and moist.  Neck: Normal range of motion. Neck supple. No tracheal deviation present. No thyromegaly present.  Cardiovascular: Normal rate, regular rhythm and normal heart sounds.  Exam reveals no gallop.   No murmur heard. Pulmonary/Chest: Effort normal and breath sounds normal.  Abdominal: Soft. She exhibits no distension. There is no tenderness.  Genitourinary: Vagina normal. No vaginal discharge found.  External GU: no rashes or lesions. Vagina: no discharge. Bimanual exam: no tenderness or obvious masses. Rectal exam: no masses; no stool for hemoccult.   Musculoskeletal: She exhibits no edema.  Lymphadenopathy:    She has no cervical adenopathy.  Neurological: She is alert and oriented to person, place, and time.  Skin: Skin is warm and dry. No rash noted.  Psychiatric: She has a normal mood and affect. Her  behavior is normal.  Vitals reviewed. Breast exam: no masses; axillae no adenopathy. Results for orders placed or performed in visit on 10/30/15  POCT glycosylated hemoglobin (Hb A1C)  Result Value Ref Range   Hemoglobin A1C 5.2    Reviewed labs from 7/8 with patient; decrease sodium in diet.        Assessment & Plan:  Well woman exam  Impaired fasting glucose - Plan: POCT glycosylated hemoglobin (Hb A1C)  Patient plans to schedule her own mammogram. Given information on colonoscopy.  Return in about 1 year (around 10/29/2016) for physical.

## 2015-11-11 ENCOUNTER — Encounter: Payer: Self-pay | Admitting: Nurse Practitioner

## 2015-11-11 DIAGNOSIS — R7301 Impaired fasting glucose: Secondary | ICD-10-CM | POA: Insufficient documentation

## 2016-03-20 ENCOUNTER — Other Ambulatory Visit: Payer: Self-pay | Admitting: Nurse Practitioner

## 2016-03-30 ENCOUNTER — Ambulatory Visit: Payer: 59 | Admitting: Nurse Practitioner

## 2016-04-01 ENCOUNTER — Ambulatory Visit (INDEPENDENT_AMBULATORY_CARE_PROVIDER_SITE_OTHER): Payer: 59 | Admitting: Nurse Practitioner

## 2016-04-01 VITALS — BP 108/62 | HR 78 | Temp 98.2°F

## 2016-04-01 DIAGNOSIS — G4700 Insomnia, unspecified: Secondary | ICD-10-CM

## 2016-04-01 DIAGNOSIS — F418 Other specified anxiety disorders: Secondary | ICD-10-CM

## 2016-04-01 DIAGNOSIS — F329 Major depressive disorder, single episode, unspecified: Secondary | ICD-10-CM

## 2016-04-01 DIAGNOSIS — F419 Anxiety disorder, unspecified: Principal | ICD-10-CM

## 2016-04-01 DIAGNOSIS — J329 Chronic sinusitis, unspecified: Secondary | ICD-10-CM | POA: Diagnosis not present

## 2016-04-01 DIAGNOSIS — F32A Depression, unspecified: Secondary | ICD-10-CM

## 2016-04-01 DIAGNOSIS — Z00129 Encounter for routine child health examination without abnormal findings: Secondary | ICD-10-CM | POA: Insufficient documentation

## 2016-04-01 MED ORDER — SERTRALINE HCL 100 MG PO TABS: ORAL_TABLET | ORAL | 5 refills | Status: DC

## 2016-04-01 MED ORDER — AMOXICILLIN-POT CLAVULANATE 875-125 MG PO TABS: 1.0000 | ORAL_TABLET | Freq: Two times a day (BID) | ORAL | 0 refills | Status: DC

## 2016-04-01 MED ORDER — TRAZODONE HCL 100 MG PO TABS: 300.0000 mg | ORAL_TABLET | Freq: Every day | ORAL | 5 refills | Status: DC

## 2016-04-01 MED ORDER — ALPRAZOLAM 0.5 MG PO TABS: ORAL_TABLET | ORAL | 5 refills | Status: DC

## 2016-04-01 MED ORDER — OLOPATADINE HCL 0.1 % OP SOLN: 1.0000 [drp] | Freq: Two times a day (BID) | OPHTHALMIC | 5 refills | Status: DC

## 2016-04-03 ENCOUNTER — Encounter: Payer: Self-pay | Admitting: Nurse Practitioner

## 2016-04-03 NOTE — Progress Notes (Signed)
Subjective:  Presents for routine follow-up on her anxiety and depression. Doing well on current regimen. Sleeping well at nighttime. Also complaints of congestion and cough that began 4 days ago. Fever has resolved. Now having sore throat, chest congestion, generalized headache. Body aches at times. Frequent nonproductive cough. Slight ear pain. Irritated eyes at times.  Objective:   BP 108/62   Pulse 78   Temp 98.2 F (36.8 C) (Oral)   SpO2 94%  NAD. Alert, oriented. TMs clear effusion, no erythema. Pharynx erythematous with PND noted. Neck supple with mild soft anterior adenopathy. Lungs clear. Heart regular rate rhythm. Thoughts logical coherent and relevant. Dressed appropriately. Making good eye contact. Conjunctiva clear.  Assessment:  Problem List Items Addressed This Visit      Other   Anxiety and depression - Primary    Other Visit Diagnoses    Rhinosinusitis       Relevant Medications   amoxicillin-clavulanate (AUGMENTIN) 875-125 MG tablet   Insomnia, unspecified type         Plan:  Meds ordered this encounter  Medications  . olopatadine (PATANOL) 0.1 % ophthalmic solution    Sig: Place 1 drop into both eyes 2 (two) times daily. Prn allergies    Dispense:  5 mL    Refill:  5    Order Specific Question:   Supervising Provider    Answer:   [name] [[id number]]  . ALPRAZolam (XANAX) 0.5 MG tablet    Sig: 1/2-1 po BID prn anxiety    Dispense:  60 tablet    Refill:  5    [facility]    Order Specific Question:   Supervising Provider    Answer:   [name] [[id number]]  . traZODone (DESYREL) 100 MG tablet    Sig: Take 3 tablets (300 mg total) by mouth at bedtime.    Dispense:  90 tablet    Refill:  5    Order Specific Question:   Supervising Provider    Answer:   [name] [[id number]]  . sertraline (ZOLOFT) 100 MG tablet    Sig: TAKE 1 1/2 TABLETS  BY MOUTH ONCE DAILY    Dispense:  45 tablet    Refill:  5    Order Specific Question:   Supervising  Provider    Answer:   [name] [[id number]]  . amoxicillin-clavulanate (AUGMENTIN) 875-125 MG tablet    Sig: Take 1 tablet by mouth 2 (two) times daily.    Dispense:  20 tablet    Refill:  0    Order Specific Question:   Supervising Provider    Answer:   [name] [[id number]]   OTC meds as directed for congestion and cough. Call back if worsens or persists. Return in about 6 months (around 09/30/2016) for recheck.

## 2016-08-06 ENCOUNTER — Ambulatory Visit: Payer: 59 | Admitting: Family Medicine

## 2016-08-06 ENCOUNTER — Emergency Department (HOSPITAL_COMMUNITY)
Admission: EM | Admit: 2016-08-06 | Discharge: 2016-08-06 | Disposition: A | Discharge status: Home / Self Care | Payer: 59 | Attending: Emergency Medicine | Admitting: Emergency Medicine

## 2016-08-06 ENCOUNTER — Encounter (HOSPITAL_COMMUNITY): Payer: Self-pay | Admitting: Emergency Medicine

## 2016-08-06 ENCOUNTER — Emergency Department (HOSPITAL_COMMUNITY): Payer: 59

## 2016-08-06 DIAGNOSIS — N390 Urinary tract infection, site not specified: Secondary | ICD-10-CM | POA: Diagnosis not present

## 2016-08-06 DIAGNOSIS — R42 Dizziness and giddiness: Secondary | ICD-10-CM | POA: Diagnosis not present

## 2016-08-06 DIAGNOSIS — F17210 Nicotine dependence, cigarettes, uncomplicated: Secondary | ICD-10-CM | POA: Insufficient documentation

## 2016-08-06 DIAGNOSIS — R569 Unspecified convulsions: Secondary | ICD-10-CM | POA: Insufficient documentation

## 2016-08-06 LAB — URINALYSIS, ROUTINE W REFLEX MICROSCOPIC
Bilirubin Urine: NEGATIVE
Glucose, UA: NEGATIVE mg/dL
KETONES UR: NEGATIVE mg/dL
LEUKOCYTES UA: NEGATIVE
Nitrite: POSITIVE — AB
PROTEIN: NEGATIVE mg/dL
Specific Gravity, Urine: 1.011 (ref 1.005–1.030)
pH: 5 (ref 5.0–8.0)

## 2016-08-06 LAB — CBC WITH DIFFERENTIAL/PLATELET
BASOS ABS: 0 10*3/uL (ref 0.0–0.1)
BASOS PCT: 0 %
EOS PCT: 1 %
Eosinophils Absolute: 0.1 10*3/uL (ref 0.0–0.7)
HCT: 52.4 % — ABNORMAL HIGH (ref 36.0–46.0)
Hemoglobin: 17.8 g/dL — ABNORMAL HIGH (ref 12.0–15.0)
LYMPHS PCT: 23 %
Lymphs Abs: 3.6 10*3/uL (ref 0.7–4.0)
MCH: 30.8 pg (ref 26.0–34.0)
MCHC: 34 g/dL (ref 30.0–36.0)
MCV: 90.8 fL (ref 78.0–100.0)
MONO ABS: 0.9 10*3/uL (ref 0.1–1.0)
Monocytes Relative: 6 %
NEUTROS ABS: 11.2 10*3/uL — AB (ref 1.7–7.7)
Neutrophils Relative %: 70 %
PLATELETS: 221 10*3/uL (ref 150–400)
RBC: 5.77 MIL/uL — AB (ref 3.87–5.11)
RDW: 13 % (ref 11.5–15.5)
WBC: 15.9 10*3/uL — AB (ref 4.0–10.5)

## 2016-08-06 LAB — COMPREHENSIVE METABOLIC PANEL
ALBUMIN: 4.3 g/dL (ref 3.5–5.0)
ALT: 23 U/L (ref 14–54)
AST: 23 U/L (ref 15–41)
Alkaline Phosphatase: 91 U/L (ref 38–126)
Anion gap: 8 (ref 5–15)
BUN: 14 mg/dL (ref 6–20)
CHLORIDE: 109 mmol/L (ref 101–111)
CO2: 23 mmol/L (ref 22–32)
CREATININE: 0.67 mg/dL (ref 0.44–1.00)
Calcium: 9.4 mg/dL (ref 8.9–10.3)
GFR calc Af Amer: >60 mL/min (ref >60)
GLUCOSE: 104 mg/dL — AB (ref 65–99)
POTASSIUM: 4.3 mmol/L (ref 3.5–5.1)
Sodium: 140 mmol/L (ref 135–145)
Total Bilirubin: 0.7 mg/dL (ref 0.3–1.2)
Total Protein: 7.1 g/dL (ref 6.5–8.1)

## 2016-08-06 LAB — RAPID URINE DRUG SCREEN, HOSP PERFORMED
Amphetamines: NOT DETECTED
BENZODIAZEPINES: POSITIVE — AB
Barbiturates: NOT DETECTED
COCAINE: NOT DETECTED
Opiates: NOT DETECTED
Tetrahydrocannabinol: NOT DETECTED

## 2016-08-06 LAB — ETHANOL

## 2016-08-06 LAB — CBG MONITORING, ED: GLUCOSE-CAPILLARY: 121 mg/dL — AB (ref 65–99)

## 2016-08-06 LAB — TROPONIN I

## 2016-08-06 MED ORDER — SODIUM CHLORIDE 0.9 % IV BOLUS (SEPSIS)
1000.0000 mL | Freq: Once | INTRAVENOUS | Status: AC
  Administered 2016-08-06: 1000 mL via INTRAVENOUS

## 2016-08-06 MED ORDER — CEPHALEXIN 500 MG PO CAPS
500.0000 mg | ORAL_CAPSULE | Freq: Once | ORAL | Status: AC
  Administered 2016-08-06: 500 mg via ORAL
  Filled 2016-08-06: qty 1

## 2016-08-06 MED ORDER — CEPHALEXIN 500 MG PO CAPS: 500.0000 mg | ORAL_CAPSULE | Freq: Four times a day (QID) | ORAL | 0 refills | Status: DC

## 2016-08-06 MED ORDER — LORAZEPAM 2 MG/ML IJ SOLN
1.0000 mg | Freq: Once | INTRAMUSCULAR | Status: AC
  Administered 2016-08-06: 1 mg via INTRAVENOUS
  Filled 2016-08-06: qty 1

## 2016-08-06 NOTE — Discharge Instructions (Signed)
It's important to drink plenty of water and take the antibiotic as directed until its finished.  Follow-up with your doctor for recheck.

## 2016-08-06 NOTE — ED Notes (Signed)
Seizure pads placed on beds.

## 2016-08-06 NOTE — ED Triage Notes (Addendum)
Per EMS: Pt reports stress with job working 10-12 hour days, in process of getting FMLA.  Pt had panic attack this morning with rapid breathing, and had left face numbness/tingling while attack happened.  Pt states she had seizure yesterday, but was not seen for for this issue.  Pt used to be on seizure medication, does not know name of it, does not take at this time. Pt is prescribed xanax but has not taken this today.  Pt denies SI cbg 112

## 2016-08-06 NOTE — ED Notes (Signed)
Husband came to desk because pts BP 91/40's.  Obtained 2nd BP, SBP 88.  Sharon Berg informed.  Pt asymptotic.

## 2016-08-06 NOTE — ED Provider Notes (Signed)
[name] DEPT Provider Note   CSN: [id number] Arrival date & time: 08/06/16  1011  By signing my name below, I, Levester Fresh, attest that this documentation has been prepared under the direction and in the presence of  Sharon Yoshika Vensel, PA-C. Electronically Signed: Levester Fresh, Scribe. 08/06/2016. 11:12 AM.  History   Chief Complaint Chief Complaint  Patient presents with  . Anxiety  . Seizures   The history is provided by the patient and a relative. No language interpreter was used.    HPI Comments:  Sharon Berg is a 51 y.o. female with a past medical history of stress related seizures, who presents to the Emergency Department via ambulance s/p two witnessed "seizures" this morning. Per husband, who is at bedside, pt experienced rhythmic jerking, however remained conscious with no vomiting or incontinence. Episodes were brief. She attempted to visit her PCP this morning, however, was unable to get an appointment until later this afternoon. Pt herself endorses a headache, dizziness worse with motion, weakness in both legs and fatigue. She notes an increased stress load at work for the last 4 months in addition to working demanding hours. She denies experiencing any chest pain, abdominal pain, nausea, vomiting or shortness of breath. She has no new medications and reports a normal appetite, however notes a lack of sleep with her work schedule. She visited a neurologist approximately 1 year ago and reports a normal EEG.  It was determined that she had no need for antiepileptics.   Past Medical History:  Diagnosis Date  . Anxiety   . Depression   . Migraine     Patient Active Problem List   Diagnosis Date Noted  . Impaired fasting glucose 11/11/2015  . Tension headache 12/18/2013  . 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 Anxiety and depression 07/30/2013    Past Surgical History:  Procedure Laterality Date  . ABDOMINAL HYSTERECTOMY  2000    OB History    No  data available      Home Medications    Prior to Admission medications   Medication Sig Start Date End Date Taking? Authorizing Provider  ALPRAZolam Duanne Moron) 0.5 MG tablet 1/2-1 po BID prn anxiety 04/01/16   Nilda Simmer, NP  amoxicillin-clavulanate (AUGMENTIN) 875-125 MG tablet Take 1 tablet by mouth 2 (two) times daily. 04/01/16   Nilda Simmer, NP  gabapentin (NEURONTIN) 100 MG capsule Take 1 capsule (100 mg total) by mouth 2 (two) times daily. 09/19/15   Nilda Simmer, NP  meloxicam (MOBIC) 15 MG tablet Take 1 tablet (15 mg total) by mouth daily. Prn pain (do not take with Aleve or other NSAID) 09/19/15   Nilda Simmer, NP  olopatadine (PATANOL) 0.1 % ophthalmic solution Place 1 drop into both eyes 2 (two) times daily. Prn allergies 04/01/16   Nilda Simmer, NP  sertraline (ZOLOFT) 100 MG tablet TAKE 1 1/2 TABLETS  BY MOUTH ONCE DAILY 04/01/16   Nilda Simmer, NP  traZODone (DESYREL) 100 MG tablet Take 3 tablets (300 mg total) by mouth at bedtime. 04/01/16   Nilda Simmer, NP    Family History Family History  Problem Relation Age of Onset  . Hypertension Mother   . Heart disease Maternal Grandmother   . Stroke Maternal Grandmother   . Heart disease Maternal Grandfather   . Dementia Paternal Grandmother   . Dementia Paternal Grandfather     Social History Social History  Substance Use Topics  . Smoking status: Current  Every Day Smoker    Packs/day: 1.50    Years: 25.00    Types: Cigarettes  . Smokeless tobacco: Never Used  . Alcohol use No   Allergies   Codeine  Review of Systems Review of Systems  Constitutional: Positive for fatigue.  Eyes: Negative for visual disturbance.  Respiratory: Negative for chest tightness and shortness of breath.   Cardiovascular: Negative for chest pain.  Gastrointestinal: Negative for abdominal pain, nausea and vomiting.  Genitourinary: Negative for dysuria.  Musculoskeletal: Negative for neck pain and neck  stiffness.  Skin: Negative for rash.  Neurological: Positive for dizziness, seizures, weakness and headaches.  All other systems reviewed and are negative.  Physical Exam Updated Vital Signs BP 117/82 (BP Location: Left Arm)   Pulse 70   Temp 98.2 F (36.8 C) (Oral)   Resp 18   Ht '5\' 6"'$  (1.676 m)   Wt 150 lb (68 kg)   SpO2 96%   BMI 24.21 kg/m   Physical Exam  Constitutional: She appears well-developed and well-nourished. No distress.  HENT:  Head: Normocephalic and atraumatic.  Mouth/Throat: Oropharynx is clear and moist.  Eyes: Conjunctivae and EOM are normal. Pupils are equal, round, and reactive to light.  Neck: Neck supple.  Cardiovascular: Normal rate, regular rhythm and intact distal pulses.   No murmur heard. Pulmonary/Chest: Effort normal and breath sounds normal. No respiratory distress.  Abdominal: Soft. She exhibits no distension. There is no tenderness. There is no guarding.  Musculoskeletal: Normal range of motion. She exhibits no edema.  4/5 strength against resistance of the bilateral lower extremities   Neurological: She is alert. No sensory deficit. Coordination normal.  CN II- XII intact No pronator drift No facial weakness Pt does not appear post-ictal  Skin: Skin is warm and dry. Capillary refill takes less than 2 seconds. No rash noted.  Psychiatric: She has a normal mood and affect. Her behavior is normal. Thought content normal.  Nursing note and vitals reviewed.   ED Treatments / Results  DIAGNOSTIC STUDIES:  Oxygen Saturation is 96 % on room air, adequate by my interpretation.    COORDINATION OF CARE:  11:19 AM Discussed treatment plan with pt and family at bedside. Pt and family agreed to plan.  Labs (all labs ordered are listed, but only abnormal results are displayed) Labs Reviewed  URINE CULTURE - Abnormal; Notable for the following:       Result Value   Culture MULTIPLE SPECIES PRESENT, SUGGEST RECOLLECTION (*)    All other  components within normal limits  RAPID URINE DRUG SCREEN, HOSP PERFORMED - Abnormal; Notable for the following:    Benzodiazepines POSITIVE (*)    All other components within normal limits  URINALYSIS, ROUTINE W REFLEX MICROSCOPIC - Abnormal; Notable for the following:    Hgb urine dipstick MODERATE (*)    Nitrite POSITIVE (*)    Bacteria, UA RARE (*)    Squamous Epithelial / LPF 0-5 (*)    All other components within normal limits  CBC WITH DIFFERENTIAL/PLATELET - Abnormal; Notable for the following:    WBC 15.9 (*)    RBC 5.77 (*)    Hemoglobin 17.8 (*)    HCT 52.4 (*)    Neutro Abs 11.2 (*)    All other components within normal limits  COMPREHENSIVE METABOLIC PANEL - Abnormal; Notable for the following:    Glucose, Bld 104 (*)    All other components within normal limits  CBG MONITORING, ED - Abnormal; Notable for the  following:    Glucose-Capillary 121 (*)    All other components within normal limits  TROPONIN I  ETHANOL    EKG  EKG Interpretation None       Radiology No results found.  Procedures Procedures (including critical care time)  Medications Ordered in ED Medications - No data to display   Initial Impression / Assessment and Plan / ED Course  I have reviewed the triage vital signs and the nursing notes.  Pertinent labs & imaging results that were available during my care of the patient were reviewed by me and considered in my medical decision making (see chart for details).     1430  Pt resting comfortably.  Non-toxic appearing.  Vitals improved.  BP now, 114/72.  Pt now complains of urinary frequency and diffuse low back pain.  Urine has been cultured and will treat for UTI.  No seizure activity during ED stay.  Appears stable for d/c.  Pt agrees to close PCP f/u and return precuations discussed.   Final Clinical Impressions(s) / ED Diagnoses   Final diagnoses:  Urinary tract infection in female  Seizure-like activity [facility])    New  Prescriptions New Prescriptions   No medications on file   I personally performed the services described in this documentation, which was scribed in my presence. The recorded information has been reviewed and is accurate.    Emali Heyward, PA-C 08/09/16 [place], DO 08/09/16 1652

## 2016-08-08 LAB — URINE CULTURE

## 2016-08-10 ENCOUNTER — Ambulatory Visit (INDEPENDENT_AMBULATORY_CARE_PROVIDER_SITE_OTHER): Payer: 59 | Admitting: Nurse Practitioner

## 2016-08-10 ENCOUNTER — Encounter: Payer: Self-pay | Admitting: Nurse Practitioner

## 2016-08-10 ENCOUNTER — Encounter: Payer: Self-pay | Admitting: Family Medicine

## 2016-08-10 VITALS — BP 120/74 | Ht 65.5 in | Wt 157.0 lb

## 2016-08-10 DIAGNOSIS — R569 Unspecified convulsions: Secondary | ICD-10-CM | POA: Diagnosis not present

## 2016-08-10 DIAGNOSIS — F329 Major depressive disorder, single episode, unspecified: Secondary | ICD-10-CM | POA: Diagnosis not present

## 2016-08-10 DIAGNOSIS — F32A Depression, unspecified: Secondary | ICD-10-CM

## 2016-08-10 DIAGNOSIS — F419 Anxiety disorder, unspecified: Secondary | ICD-10-CM

## 2016-08-10 MED ORDER — ALPRAZOLAM 1 MG PO TABS: ORAL_TABLET | ORAL | 2 refills | Status: DC

## 2016-08-10 NOTE — Progress Notes (Signed)
Subjective:  Presents for follow-up after an ED visit on 4/19 for seizure-like activity and he UTI. UTI symptoms are much better. The last time she had a "seizure" is about 6 months prior to this. Had one episode on 4/18 and 2 on 4/19, her husband called EMS for transport to ED. Describes episodes as all of her muscles spasming and tightening up. Has difficulty moving. Is aware of her surroundings and what people are saying. Has responded to questions, usually ask people to give her space and leave her alone. Has noticed the main trigger is stress. Has been working 12 hours a day 7 days a week for the past few months. Takes 0.5 mg of Xanax 1-4 times per day depending on level of anxiety. Has been extremely stressed. Currently on Zoloft and trazodone. Has no activities outside of work due to limitations on time. Averages about 6 hours sleep per night. Denies suicidal or homicidal thoughts or ideation. States she had a complete workup for seizures a few years ago, no specific problems were found.  Objective:   BP 120/74   Ht 5' 5.5" (1.664 m)   Wt 157 lb (71.2 kg)   BMI 25.73 kg/m  NAD. Alert, oriented. Extremely fatigued in appearance. Mildly anxious affect. Thoughts logical coherent and relevant. Dressed appropriately. Making good eye contact. No unusual motor movements noted. Lungs clear. Heart regular rate rhythm. CT scan of the head without contrast dated 08/06/2016 was normal.  Assessment:   Problem List Items Addressed This Visit      Other   Anxiety and depression - Primary   Seizure-like activity ([place])       Plan:   Meds ordered this encounter  Medications  . ALPRAZolam (XANAX) 1 MG tablet    Sig: 1/2 - 1 po BID prn anxiety; max 2 pills per day    Dispense:  60 tablet    Refill:  2    [facility]; please cancel other Xanax prescriptions    Order Specific Question:   Supervising Provider    Answer:   [name] [[id number]]   Based on patient's description, feel episodes are most  likely due to extreme stress and fatigue. No further workup indicated at this time. Defers neurology or psychiatric referrals.  Patient agreed upon the plan as follows: #1 continue Zoloft and trazodone as directed. #2 increase Xanax to 1 mg half to 1 tab by mouth twice a day when necessary no more than 2 per day. #3 patient to get her FMLA forms to our office, will request a decrease in her hours to 10 hours per day 5 days per week. Return in about 3 months (around 11/09/2016) for recheck. Call back sooner or go to ED if any problems.

## 2016-08-10 NOTE — Patient Instructions (Signed)
FMLA form

## 2016-08-12 DIAGNOSIS — Z029 Encounter for administrative examinations, unspecified: Secondary | ICD-10-CM

## 2016-09-24 ENCOUNTER — Other Ambulatory Visit: Payer: Self-pay | Admitting: Nurse Practitioner

## 2016-09-28 ENCOUNTER — Ambulatory Visit (INDEPENDENT_AMBULATORY_CARE_PROVIDER_SITE_OTHER): Payer: 59 | Admitting: Nurse Practitioner

## 2016-09-28 ENCOUNTER — Encounter: Payer: Self-pay | Admitting: Nurse Practitioner

## 2016-09-28 VITALS — BP 114/74 | Ht 65.5 in | Wt 156.8 lb

## 2016-09-28 DIAGNOSIS — F329 Major depressive disorder, single episode, unspecified: Secondary | ICD-10-CM | POA: Diagnosis not present

## 2016-09-28 DIAGNOSIS — F419 Anxiety disorder, unspecified: Secondary | ICD-10-CM | POA: Diagnosis not present

## 2016-09-28 DIAGNOSIS — R5383 Other fatigue: Secondary | ICD-10-CM

## 2016-09-28 DIAGNOSIS — R0683 Snoring: Secondary | ICD-10-CM | POA: Diagnosis not present

## 2016-09-28 DIAGNOSIS — F32A Depression, unspecified: Secondary | ICD-10-CM

## 2016-09-28 MED ORDER — SERTRALINE HCL 100 MG PO TABS: ORAL_TABLET | ORAL | 5 refills | Status: DC

## 2016-09-30 ENCOUNTER — Encounter: Payer: Self-pay | Admitting: Nurse Practitioner

## 2016-09-30 NOTE — Progress Notes (Signed)
Subjective:  Presents for recheck on her anxiety. Doing much better overall. Has made some adjustments in her job and schedule which has greatly helped. Her main issue continues to be trouble sleeping. In addition, when she does sleep, she still feels tired the next day. Husband has told her she snores and has periods of apnea. See [facility] questionnaire results.   Objective:   BP 114/74   Ht 5' 5.5" (1.664 m)   Wt 156 lb 12.8 oz (71.1 kg)   BMI 25.70 kg/m  NAD. Alert, oriented. Cheerful affect. Fatigued in appearance. Lungs clear. Heart RRR.   Assessment:   Problem List Items Addressed This Visit      Other   Anxiety and depression - Primary    Other Visit Diagnoses    Snoring       Fatigue, unspecified type           Plan:   Meds ordered this encounter  Medications  . sertraline (ZOLOFT) 100 MG tablet    Sig: TAKE 1 1/2 TABLETS  BY MOUTH ONCE DAILY    Dispense:  45 tablet    Refill:  5    Order Specific Question:   Supervising Provider    Answer:   [name] [[id number]]   Continue current medications. Will refer to [facility]ST. Reminded patient about wellness exam. Return in about 3 months (around 12/29/2016) for recheck.

## 2016-10-16 ENCOUNTER — Other Ambulatory Visit (HOSPITAL_BASED_OUTPATIENT_CLINIC_OR_DEPARTMENT_OTHER): Payer: Self-pay

## 2016-10-16 DIAGNOSIS — R0683 Snoring: Secondary | ICD-10-CM

## 2016-10-16 DIAGNOSIS — G4730 Sleep apnea, unspecified: Secondary | ICD-10-CM

## 2016-10-16 DIAGNOSIS — G4710 Hypersomnia, unspecified: Secondary | ICD-10-CM

## 2016-10-16 DIAGNOSIS — R5383 Other fatigue: Secondary | ICD-10-CM

## 2016-10-30 ENCOUNTER — Ambulatory Visit: Payer: 59 | Attending: Family Medicine | Admitting: Neurology

## 2016-10-30 DIAGNOSIS — G4730 Sleep apnea, unspecified: Secondary | ICD-10-CM | POA: Diagnosis present

## 2016-10-30 DIAGNOSIS — R0683 Snoring: Secondary | ICD-10-CM | POA: Diagnosis present

## 2016-10-30 DIAGNOSIS — R5383 Other fatigue: Secondary | ICD-10-CM | POA: Diagnosis not present

## 2016-10-30 DIAGNOSIS — G4710 Hypersomnia, unspecified: Secondary | ICD-10-CM

## 2016-10-30 DIAGNOSIS — R400 Somnolence: Secondary | ICD-10-CM | POA: Insufficient documentation

## 2016-11-07 NOTE — Procedures (Signed)
  Harahan A. Merlene Laughter, MD     [email or web address]             NOCTURNAL POLYSOMNOGRAPHY   LOCATION: [facility]   Patient Name: Sharon Berg, Sharon Berg Date: 10/30/2016 Gender: Female D.O.B: [birth year] Age (years): 57 Referring Provider: Viona Gilmore. Rosemary Holms Height (inches): 50 Interpreting Physician: Phillips Odor MD, ABSM Weight (lbs): 150 RPSGT: Rosebud Poles BMI: 24 MRN: [id number] Neck Size: CLINICAL INFORMATION Sleep Study Type: HST  Indication for sleep study: Excessive Daytime Sleepiness, Fatigue, Snoring  Epworth Sleepiness Score: NA  SLEEP STUDY TECHNIQUE A multi-channel overnight portable sleep study was performed. The channels recorded were: nasal airflow, thoracic respiratory movement, and oxygen saturation with a pulse oximetry. Snoring was also monitored.  MEDICATIONS Patient self administered medications include: N/A.  Current Outpatient Prescriptions:  .  ALPRAZolam (XANAX) 1 MG tablet, 1/2 - 1 po BID prn anxiety; max 2 pills per day, Disp: 60 tablet, Rfl: 2 .  gabapentin (NEURONTIN) 100 MG capsule, Take 1 capsule (100 mg total) by mouth 2 (two) times daily., Disp: 60 capsule, Rfl: 5 .  ibuprofen (ADVIL,MOTRIN) 200 MG tablet, Take 800 mg by mouth every 6 (six) hours as needed for moderate pain., Disp: , Rfl:  .  sertraline (ZOLOFT) 100 MG tablet, TAKE 1 1/2 TABLETS  BY MOUTH ONCE DAILY, Disp: 45 tablet, Rfl: 5 .  traZODone (DESYREL) 100 MG tablet, take 3 tablets at bedtime, Disp: 90 tablet, Rfl: 5   SLEEP ARCHITECTURE Patient was studied for 478.8 minutes. The sleep efficiency was 100.0 % and the patient was supine for 14.4%. The arousal index was 6.1 per hour.  RESPIRATORY PARAMETERS The overall AHI was 3.4 per hour, with a central apnea index of 0.0 per hour.  The oxygen nadir was 87% during sleep.  CARDIAC DATA Mean heart rate during sleep was 68.4 bpm.  IMPRESSIONS - No significant obstructive sleep apnea occurred during this  study (AHI = 3.4/h). - No significant central sleep apnea occurred during this study (CAI = 0.0/h).   Delano Metz, MD Diplomate, American Board of Sleep Medicine.  ELECTRONICALLY SIGNED ON:  11/07/2016, 12:11 PM [place] PH: [phone]   FX: [phone] [place]

## 2016-11-17 ENCOUNTER — Telehealth: Payer: Self-pay | Admitting: Family Medicine

## 2016-11-17 NOTE — Telephone Encounter (Signed)
Results under notes 10/30/16

## 2016-11-17 NOTE — Telephone Encounter (Signed)
Sleep study is normal, may send or fatigue I would recommend follow-up office visit with Dr. Richardson Landry

## 2016-11-17 NOTE — Telephone Encounter (Signed)
Requesting results to sleep study.

## 2016-11-18 NOTE — Telephone Encounter (Signed)
Mailbox is full.

## 2016-11-18 NOTE — Telephone Encounter (Signed)
Patient advised Sleep study is normal, recommend follow-up office visit with Dr. Richardson Landry. Patient verbalized understanding.

## 2016-12-01 ENCOUNTER — Other Ambulatory Visit: Payer: Self-pay | Admitting: Family Medicine

## 2016-12-01 NOTE — Telephone Encounter (Signed)
Ok three mo

## 2016-12-01 NOTE — Telephone Encounter (Signed)
Last seen 09/28/16

## 2016-12-03 ENCOUNTER — Other Ambulatory Visit: Payer: Self-pay | Admitting: Family Medicine

## 2017-03-18 ENCOUNTER — Other Ambulatory Visit: Payer: Self-pay | Admitting: Nurse Practitioner

## 2017-03-22 ENCOUNTER — Other Ambulatory Visit: Payer: Self-pay | Admitting: Nurse Practitioner

## 2017-03-31 ENCOUNTER — Ambulatory Visit: Payer: 59 | Admitting: Nurse Practitioner

## 2017-03-31 ENCOUNTER — Encounter: Payer: Self-pay | Admitting: Nurse Practitioner

## 2017-03-31 VITALS — BP 110/74 | Ht 65.5 in | Wt 162.0 lb

## 2017-03-31 DIAGNOSIS — F419 Anxiety disorder, unspecified: Secondary | ICD-10-CM

## 2017-03-31 DIAGNOSIS — Z23 Encounter for immunization: Secondary | ICD-10-CM | POA: Diagnosis not present

## 2017-03-31 DIAGNOSIS — R5383 Other fatigue: Secondary | ICD-10-CM

## 2017-03-31 DIAGNOSIS — F32A Depression, unspecified: Secondary | ICD-10-CM

## 2017-03-31 DIAGNOSIS — F338 Other recurrent depressive disorders: Secondary | ICD-10-CM

## 2017-03-31 DIAGNOSIS — F329 Major depressive disorder, single episode, unspecified: Secondary | ICD-10-CM

## 2017-03-31 MED ORDER — ALPRAZOLAM 1 MG PO TABS: ORAL_TABLET | ORAL | 5 refills | Status: DC

## 2017-03-31 MED ORDER — TRAZODONE HCL 100 MG PO TABS: 300.0000 mg | ORAL_TABLET | Freq: Every day | ORAL | 5 refills | Status: DC

## 2017-03-31 MED ORDER — SERTRALINE HCL 100 MG PO TABS: ORAL_TABLET | ORAL | 5 refills | Status: DC

## 2017-04-01 ENCOUNTER — Encounter: Payer: Self-pay | Admitting: Nurse Practitioner

## 2017-04-01 DIAGNOSIS — F338 Other recurrent depressive disorders: Secondary | ICD-10-CM | POA: Insufficient documentation

## 2017-04-01 NOTE — Progress Notes (Signed)
Subjective: Presents for recheck on her anxiety/depression.  Sleep is doing well on 300 mg of trazodone.  Has cut back on her work per our instructions, working 10-hour days 5 days/week.  Current regimen seems to be working well.  Has a history of seasonal affective disorder.  Has noticed more fatigue lately. Depression screen PHQ 2/9 03/31/2017  Decreased Interest 1  Down, Depressed, Hopeless 1  PHQ - 2 Score 2     Objective:   BP 110/74   Ht 5' 5.5" (1.664 m)   Wt 162 lb (73.5 kg)   BMI 26.55 kg/m  NAD.  Alert, oriented.  Cheerful calm affect.  Thoughts logical coherent and relevant.  Dressed appropriately.  Making good eye contact.  Lungs clear.  Heart regular rate and rhythm.  Assessment:   Problem List Items Addressed This Visit      Other   Anxiety and depression - Primary   Relevant Medications   ALPRAZolam (XANAX) 1 MG tablet   sertraline (ZOLOFT) 100 MG tablet   traZODone (DESYREL) 100 MG tablet   Seasonal affective disorder ([facility])   Relevant Medications   ALPRAZolam (XANAX) 1 MG tablet   sertraline (ZOLOFT) 100 MG tablet   traZODone (DESYREL) 100 MG tablet    Other Visit Diagnoses    Fatigue, unspecified type       Relevant Orders   VITAMIN D 25 Hydroxy (Vit-D Deficiency, Fractures)   TSH   Need for vaccination       Relevant Orders   Flu Vaccine QUAD 6+ mos PF IM (Fluarix Quad PF) (Completed)       Plan:   Meds ordered this encounter  Medications  . ALPRAZolam (XANAX) 1 MG tablet    Sig: TAKE 1/2 TO 1 TABLET TWICE A DAY FOR ANXIETY    Dispense:  60 tablet    Refill:  5    May refill monthly    Order Specific Question:   Supervising Provider    Answer:   [name] [[id number]]  . sertraline (ZOLOFT) 100 MG tablet    Sig: take 1.5 (ONE AND ONE-HALF) tablets by mouth once daily    Dispense:  45 tablet    Refill:  5    Needs office visit    Order Specific Question:   Supervising Provider    Answer:   [name] [[id number]]  . traZODone (DESYREL)  100 MG tablet    Sig: Take 3 tablets (300 mg total) by mouth at bedtime.    Dispense:  90 tablet    Refill:  5    Needs office visit.    Order Specific Question:   Supervising Provider    Answer:   Maggie Font   Continue current regimen as directed.  Encouraged regular activity and healthy diet.  Also encouraged exposure to light and a B vitamin complex with selenium for seasonal effective disorder.  Strongly recommend preventive health physical. Return in about 6 months (around 09/29/2017) for recheck.

## 2017-06-07 ENCOUNTER — Ambulatory Visit: Payer: 59 | Admitting: Nurse Practitioner

## 2017-06-07 ENCOUNTER — Encounter: Payer: Self-pay | Admitting: Family Medicine

## 2017-06-07 ENCOUNTER — Encounter: Payer: Self-pay | Admitting: Nurse Practitioner

## 2017-06-07 VITALS — BP 132/88 | Temp 98.1°F | Ht 65.5 in | Wt 154.6 lb

## 2017-06-07 DIAGNOSIS — B9689 Other specified bacterial agents as the cause of diseases classified elsewhere: Secondary | ICD-10-CM | POA: Diagnosis not present

## 2017-06-07 DIAGNOSIS — J111 Influenza due to unidentified influenza virus with other respiratory manifestations: Secondary | ICD-10-CM

## 2017-06-07 DIAGNOSIS — J209 Acute bronchitis, unspecified: Secondary | ICD-10-CM

## 2017-06-07 DIAGNOSIS — J069 Acute upper respiratory infection, unspecified: Secondary | ICD-10-CM | POA: Diagnosis not present

## 2017-06-07 MED ORDER — LEVOFLOXACIN 500 MG PO TABS: 500.0000 mg | ORAL_TABLET | Freq: Every day | ORAL | 0 refills | Status: DC

## 2017-06-07 MED ORDER — HYDROCODONE-HOMATROPINE 5-1.5 MG/5ML PO SYRP: 5.0000 mL | ORAL_SOLUTION | ORAL | 0 refills | Status: DC | PRN

## 2017-06-07 MED ORDER — ALBUTEROL SULFATE HFA 108 (90 BASE) MCG/ACT IN AERS: 2.0000 | INHALATION_SPRAY | RESPIRATORY_TRACT | 0 refills | Status: DC | PRN

## 2017-06-07 NOTE — Progress Notes (Signed)
Subjective:  Presents for complaints of flulike symptoms that began 3 days ago.  Started with fever sore throat headache cough fatigue and myalgias.  Continues to have frequent cough now producing occasional green mucus.  Slight wheeze.  Increased muscle aches.  Slight chest pain with cough.  Smokes 1 pack/day.  Slight diarrhea x1.  Posttussive vomiting x1.  Taking fluids well.  Voiding normal limit.   Objective:   BP 132/88   Temp 98.1 F (36.7 C) (Oral)   Ht 5' 5.5" (1.664 m)   Wt 154 lb 9.6 oz (70.1 kg)   BMI 25.34 kg/m  NAD.  Alert, oriented.  TMs retracted, no erythema.  Pharynx moderately erythematous with PND noted.  Neck supple with mild soft anterior adenopathy.  Lungs scattered coarse expiratory crackles, faint expiratory wheezes noted left side posterior.  No tachypnea.  Normal color.  No indication for nebulizer treatment.  Heart regular rate and rhythm.  Abdomen soft nontender.  Assessment:  Bacterial upper respiratory infection  Acute bronchitis, unspecified organism  Influenza resolving    Plan:   Meds ordered this encounter  Medications  . levofloxacin (LEVAQUIN) 500 MG tablet    Sig: Take 1 tablet (500 mg total) by mouth daily.    Dispense:  10 tablet    Refill:  0    Order Specific Question:   Supervising Provider    Answer:   [name] [[id number]]  . HYDROcodone-homatropine (HYCODAN) 5-1.5 MG/5ML syrup    Sig: Take 5 mLs by mouth every 4 (four) hours as needed.    Dispense:  90 mL    Refill:  0    Order Specific Question:   Supervising Provider    Answer:   [name] [[id number]]  . albuterol (PROVENTIL HFA;VENTOLIN HFA) 108 (90 Base) MCG/ACT inhaler    Sig: Inhale 2 puffs into the lungs every 4 (four) hours as needed.    Dispense:  1 Inhaler    Refill:  0    Order Specific Question:   Supervising Provider    Answer:   [name] [[id number]]   Reviewed symptomatic care and warning signs.  Call back in 3-4 days if no improvement, sooner if worse.

## 2017-06-10 ENCOUNTER — Other Ambulatory Visit: Payer: Self-pay | Admitting: Nurse Practitioner

## 2017-06-10 MED ORDER — ALBUTEROL SULFATE HFA 108 (90 BASE) MCG/ACT IN AERS: 2.0000 | INHALATION_SPRAY | RESPIRATORY_TRACT | 0 refills | Status: DC | PRN

## 2017-06-14 ENCOUNTER — Telehealth: Payer: Self-pay | Admitting: Nurse Practitioner

## 2017-06-14 NOTE — Telephone Encounter (Signed)
She was placed on a high powered antibiotic. This may be a continuation of the original illness but I recommend a recheck tomorrow.

## 2017-06-14 NOTE — Telephone Encounter (Signed)
Patient was diagnosed with the flu and bacterial URI on 06/07/17 by Hoyle Sauer.  She said she just still don't feel good.  Still having body aches, cough, runny nose, fatigue.  She wants to know what Hoyle Sauer recommends?   [facility] on [address]

## 2017-06-14 NOTE — Telephone Encounter (Signed)
Spoke with pt and she is getting an appt for tomorrow

## 2017-06-14 NOTE — Telephone Encounter (Signed)
Please advise 

## 2017-06-14 NOTE — Telephone Encounter (Signed)
Patient called to check on this message.  She wants a call back today.

## 2017-06-15 ENCOUNTER — Encounter: Payer: Self-pay | Admitting: Family Medicine

## 2017-06-15 ENCOUNTER — Ambulatory Visit: Payer: 59 | Admitting: Family Medicine

## 2017-06-15 VITALS — BP 112/72 | Temp 98.3°F | Ht 65.5 in | Wt 152.4 lb

## 2017-06-15 DIAGNOSIS — M791 Myalgia, unspecified site: Secondary | ICD-10-CM | POA: Diagnosis not present

## 2017-06-15 DIAGNOSIS — J329 Chronic sinusitis, unspecified: Secondary | ICD-10-CM

## 2017-06-15 DIAGNOSIS — J310 Chronic rhinitis: Secondary | ICD-10-CM

## 2017-06-15 DIAGNOSIS — T3695XA Adverse effect of unspecified systemic antibiotic, initial encounter: Secondary | ICD-10-CM

## 2017-06-15 MED ORDER — CEFDINIR 300 MG PO CAPS: 300.0000 mg | ORAL_CAPSULE | Freq: Two times a day (BID) | ORAL | 0 refills | Status: DC

## 2017-06-15 NOTE — Progress Notes (Signed)
   Subjective:    Patient ID: Sharon Berg, female    DOB: June 04, 1965, 52 y.o.   MRN: [id number]  Cough  This is a new problem. The current episode started 1 to 4 weeks ago. Associated symptoms include myalgias and nasal congestion. Treatments tried: antibiotic and hycodan.   Hit hard on the front end  Body ahcing bad overa ll  Cough not productive  cont'd substsntial bod y aches   Pos smoker   See prior note    Review of Systems  Respiratory: Positive for cough.   Musculoskeletal: Positive for myalgias.       Objective:   Physical Exam Alert active good hydration mild nasal congestion and some frontal fullness and tenderness.  Pharynx normal lungs no wheezes currently no cough no crackles heart regular rate and rhythm.       Assessment & Plan:  Impression persistent rhinosinusitis post flu.  Also persistent muscle aches a full 10 days be on presentation.  I think has something to do with her Levaquin.  Levaquin last day today.  Encouraged to avoid future will switch antibiotics symptom care discussed

## 2017-06-19 ENCOUNTER — Other Ambulatory Visit: Payer: Self-pay | Admitting: Nurse Practitioner

## 2017-09-20 ENCOUNTER — Encounter: Payer: Self-pay | Admitting: Nurse Practitioner

## 2017-09-20 ENCOUNTER — Ambulatory Visit: Payer: 59 | Admitting: Nurse Practitioner

## 2017-09-20 VITALS — BP 118/70 | Ht 65.5 in | Wt 150.0 lb

## 2017-09-20 DIAGNOSIS — Z114 Encounter for screening for human immunodeficiency virus [HIV]: Secondary | ICD-10-CM

## 2017-09-20 DIAGNOSIS — R5383 Other fatigue: Secondary | ICD-10-CM | POA: Diagnosis not present

## 2017-09-20 DIAGNOSIS — Z1159 Encounter for screening for other viral diseases: Secondary | ICD-10-CM | POA: Diagnosis not present

## 2017-09-20 DIAGNOSIS — Z79899 Other long term (current) drug therapy: Secondary | ICD-10-CM | POA: Diagnosis not present

## 2017-09-20 DIAGNOSIS — F419 Anxiety disorder, unspecified: Secondary | ICD-10-CM | POA: Diagnosis not present

## 2017-09-20 DIAGNOSIS — Z13220 Encounter for screening for lipoid disorders: Secondary | ICD-10-CM

## 2017-09-20 DIAGNOSIS — R7301 Impaired fasting glucose: Secondary | ICD-10-CM

## 2017-09-20 DIAGNOSIS — F32A Depression, unspecified: Secondary | ICD-10-CM

## 2017-09-20 DIAGNOSIS — F329 Major depressive disorder, single episode, unspecified: Secondary | ICD-10-CM | POA: Diagnosis not present

## 2017-09-20 MED ORDER — SERTRALINE HCL 100 MG PO TABS: ORAL_TABLET | ORAL | 5 refills | Status: DC

## 2017-09-20 MED ORDER — ALPRAZOLAM 1 MG PO TABS: ORAL_TABLET | ORAL | 5 refills | Status: DC

## 2017-09-20 MED ORDER — TRAZODONE HCL 100 MG PO TABS: 300.0000 mg | ORAL_TABLET | Freq: Every day | ORAL | 5 refills | Status: DC

## 2017-09-21 ENCOUNTER — Encounter: Payer: Self-pay | Admitting: Nurse Practitioner
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
## 2017-09-26 ENCOUNTER — Other Ambulatory Visit: Payer: Self-pay | Admitting: Nurse Practitioner

## 2017-09-27 LAB — HEPATIC FUNCTION PANEL
ALBUMIN: 4.5 g/dL (ref 3.5–5.5)
ALT: 17 IU/L (ref 0–32)
AST: 19 IU/L (ref 0–40)
Alkaline Phosphatase: 95 IU/L (ref 39–117)
Bilirubin Total: 0.4 mg/dL (ref 0.0–1.2)
Bilirubin, Direct: 0.13 mg/dL (ref 0.00–0.40)
TOTAL PROTEIN: 6.6 g/dL (ref 6.0–8.5)

## 2017-09-27 LAB — CBC WITH DIFFERENTIAL/PLATELET
BASOS ABS: 0 10*3/uL (ref 0.0–0.2)
Basos: 0 %
EOS (ABSOLUTE): 0.2 10*3/uL (ref 0.0–0.4)
Eos: 2 %
Hematocrit: 52.8 % — ABNORMAL HIGH (ref 34.0–46.6)
Hemoglobin: 18.1 g/dL — ABNORMAL HIGH (ref 11.1–15.9)
IMMATURE GRANS (ABS): 0 10*3/uL (ref 0.0–0.1)
Immature Granulocytes: 0 %
LYMPHS ABS: 3.4 10*3/uL — AB (ref 0.7–3.1)
LYMPHS: 29 %
MCH: 31.2 pg (ref 26.6–33.0)
MCHC: 34.3 g/dL (ref 31.5–35.7)
MCV: 91 fL (ref 79–97)
MONOCYTES: 7 %
Monocytes Absolute: 0.8 10*3/uL (ref 0.1–0.9)
NEUTROS ABS: 7.1 10*3/uL — AB (ref 1.4–7.0)
Neutrophils: 62 %
PLATELETS: 239 10*3/uL (ref 150–450)
RBC: 5.8 x10E6/uL — AB (ref 3.77–5.28)
RDW: 13.8 % (ref 12.3–15.4)
WBC: 11.5 10*3/uL — AB (ref 3.4–10.8)

## 2017-09-27 LAB — LIPID PANEL
CHOL/HDL RATIO: 2.8 ratio (ref 0.0–4.4)
CHOLESTEROL TOTAL: 144 mg/dL (ref 100–199)
HDL: 51 mg/dL (ref >39)
LDL CALC: 74 mg/dL (ref 0–99)
TRIGLYCERIDES: 96 mg/dL (ref 0–149)
VLDL CHOLESTEROL CAL: 19 mg/dL (ref 5–40)

## 2017-09-27 LAB — BASIC METABOLIC PANEL
BUN/Creatinine Ratio: 25 — ABNORMAL HIGH (ref 9–23)
BUN: 17 mg/dL (ref 6–24)
CALCIUM: 9.4 mg/dL (ref 8.7–10.2)
CHLORIDE: 105 mmol/L (ref 96–106)
CO2: 22 mmol/L (ref 20–29)
Creatinine, Ser: 0.69 mg/dL (ref 0.57–1.00)
GFR calc non Af Amer: 99 mL/min/{1.73_m2} (ref >59)
GFR, EST AFRICAN AMERICAN: 114 mL/min/{1.73_m2} (ref >59)
GLUCOSE: 133 mg/dL — AB (ref 65–99)
POTASSIUM: 4.7 mmol/L (ref 3.5–5.2)
Sodium: 141 mmol/L (ref 134–144)

## 2017-09-27 LAB — HIV ANTIBODY (ROUTINE TESTING W REFLEX): HIV Screen 4th Generation wRfx: NONREACTIVE

## 2017-09-27 LAB — TSH: TSH: 1.66 u[IU]/mL (ref 0.450–4.500)

## 2017-09-27 LAB — VITAMIN D 25 HYDROXY (VIT D DEFICIENCY, FRACTURES): Vit D, 25-Hydroxy: 65 ng/mL (ref 30.0–100.0)

## 2017-09-27 LAB — HEPATITIS C ANTIBODY: Hep C Virus Ab: <0.1 s/co ratio (ref 0.0–0.9)

## 2017-09-28 ENCOUNTER — Telehealth: Payer: Self-pay | Admitting: *Deleted

## 2017-09-28 ENCOUNTER — Other Ambulatory Visit: Payer: Self-pay | Admitting: Nurse Practitioner

## 2017-09-28 MED ORDER — NITROFURANTOIN MONOHYD MACRO 100 MG PO CAPS: 100.0000 mg | ORAL_CAPSULE | Freq: Two times a day (BID) | ORAL | 0 refills | Status: DC
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
## 2017-09-28 NOTE — Telephone Encounter (Signed)
Sent in antibiotic. Call back if worsens or persists.

## 2017-09-28 NOTE — Telephone Encounter (Signed)
Pt.notified

## 2017-09-28 NOTE — Telephone Encounter (Signed)
Left message to return call 

## 2017-09-29 ENCOUNTER — Ambulatory Visit: Payer: 59 | Admitting: Nurse Practitioner

## 2017-12-07 ENCOUNTER — Ambulatory Visit: Payer: 59 | Admitting: Family Medicine

## 2017-12-15 ENCOUNTER — Encounter: Payer: Self-pay | Admitting: Family Medicine

## 2017-12-15 ENCOUNTER — Ambulatory Visit: Payer: 59 | Admitting: Family Medicine

## 2017-12-15 VITALS — BP 124/70 | Temp 98.6°F | Ht 65.5 in | Wt 147.0 lb

## 2017-12-15 DIAGNOSIS — F419 Anxiety disorder, unspecified: Secondary | ICD-10-CM

## 2017-12-15 DIAGNOSIS — F329 Major depressive disorder, single episode, unspecified: Secondary | ICD-10-CM | POA: Diagnosis not present

## 2017-12-15 DIAGNOSIS — J329 Chronic sinusitis, unspecified: Secondary | ICD-10-CM

## 2017-12-15 DIAGNOSIS — F32A Depression, unspecified: Secondary | ICD-10-CM

## 2017-12-15 DIAGNOSIS — J4521 Mild intermittent asthma with (acute) exacerbation: Secondary | ICD-10-CM

## 2017-12-15 MED ORDER — ALPRAZOLAM 1 MG PO TABS: ORAL_TABLET | ORAL | 5 refills | Status: DC

## 2017-12-15 MED ORDER — AMOXICILLIN-POT CLAVULANATE 875-125 MG PO TABS: 1.0000 | ORAL_TABLET | Freq: Two times a day (BID) | ORAL | 0 refills | Status: DC

## 2017-12-15 MED ORDER — TRAZODONE HCL 100 MG PO TABS: 300.0000 mg | ORAL_TABLET | Freq: Every day | ORAL | 5 refills | Status: DC

## 2017-12-15 MED ORDER — SERTRALINE HCL 100 MG PO TABS: ORAL_TABLET | ORAL | 5 refills | Status: DC

## 2017-12-15 NOTE — Progress Notes (Signed)
   Subjective:    Patient ID: Sharon Berg, female    DOB: 06/30/1965, 52 y.o.   MRN: [id number] Patient arrives with numerous concerns. Anxiety  Presents for follow-up visit.      Congestion, cough, body aches, headaches for 2 and a half weeks.   Smokes  Coughing bad and productive, headache primarily frontal.  Worse with a cough.  Worse with changing position.   Uses an inhaler prn for wheezing.  Uses intermittently.  Unfortunately still smoking  Concerned about prior blood work with mild elevation of white blood count.  This is reviewed with patient.  Ongoing challenges with anxiety and depression.  States overall better on current medication.  Has no desire to see a psychiatrist.  Patient notes ongoing seizures.  All notes reviewed.  A bit mysterious with questionable complex partial seizures along with a behavioral component.  Patient also does not want to see a neurologist claims these never occur when driving and always she knows when they are coming off Review of Systems No headache, no major weight loss or weight gain, no chest pain no back pain abdominal pain no change in bowel habits complete ROS otherwise negative     Objective:   Physical Exam Alert and oriented, vitals reviewed and stable, NAD ENT-TM's and ext canals frontal congestion stuffiness otherwise WNL bilat via otoscopic exam Soft palate, tonsils and post pharynx WNL via oropharyngeal exam Neck-symmetric, no masses; thyroid nonpalpable and nontender Pulmonary-no tachypnea or accessory muscle use; Mild wheezes via auscultation Card--no abnrml murmurs, rhythm reg and rate WNL Carotid pulses symmetric, without bruits        Assessment & Plan:  Impression anxiety/depression overall improved.  Patient states medications help and wishes to stay on  2.  Rhinosinusitis discussed will cover with antibiotics  3.  Exacerbation reactive airways.  Encouraged to stop smoking.  Use albuterol.  4.  Seizures.   See present illness.  Patient wishes no neurology referral  Follow-up in 6 months  Greater than 50% of this 25 minute face to face visit was spent in counseling and discussion and coordination of care regarding the above diagnosis/diagnosies

## 2018-02-23 ENCOUNTER — Other Ambulatory Visit: Payer: Self-pay

## 2018-02-23 ENCOUNTER — Emergency Department (HOSPITAL_COMMUNITY): Payer: 59

## 2018-02-23 ENCOUNTER — Emergency Department (HOSPITAL_COMMUNITY)
Admission: EM | Admit: 2018-02-23 | Discharge: 2018-02-23 | Disposition: A | Discharge status: Home / Self Care | Payer: 59 | Attending: Emergency Medicine | Admitting: Emergency Medicine

## 2018-02-23 ENCOUNTER — Encounter (HOSPITAL_COMMUNITY): Payer: Self-pay | Admitting: Emergency Medicine

## 2018-02-23 DIAGNOSIS — R042 Hemoptysis: Secondary | ICD-10-CM | POA: Diagnosis not present

## 2018-02-23 DIAGNOSIS — C3490 Malignant neoplasm of unspecified part of unspecified bronchus or lung: Secondary | ICD-10-CM | POA: Diagnosis not present

## 2018-02-23 DIAGNOSIS — F17210 Nicotine dependence, cigarettes, uncomplicated: Secondary | ICD-10-CM | POA: Insufficient documentation

## 2018-02-23 DIAGNOSIS — Z79899 Other long term (current) drug therapy: Secondary | ICD-10-CM | POA: Insufficient documentation

## 2018-02-23 DIAGNOSIS — R079 Chest pain, unspecified: Secondary | ICD-10-CM | POA: Diagnosis present

## 2018-02-23 LAB — BASIC METABOLIC PANEL
Anion gap: 9 (ref 5–15)
BUN: 17 mg/dL (ref 6–20)
CALCIUM: 9.4 mg/dL (ref 8.9–10.3)
CO2: 22 mmol/L (ref 22–32)
CREATININE: 0.61 mg/dL (ref 0.44–1.00)
Chloride: 106 mmol/L (ref 98–111)
GFR calc Af Amer: >60 mL/min (ref >60)
GFR calc non Af Amer: >60 mL/min (ref >60)
Glucose, Bld: 89 mg/dL (ref 70–99)
Potassium: 3.4 mmol/L — ABNORMAL LOW (ref 3.5–5.1)
SODIUM: 137 mmol/L (ref 135–145)

## 2018-02-23 LAB — CBC
HCT: 50.7 % — ABNORMAL HIGH (ref 36.0–46.0)
Hemoglobin: 16.5 g/dL — ABNORMAL HIGH (ref 12.0–15.0)
MCH: 30.1 pg (ref 26.0–34.0)
MCHC: 32.5 g/dL (ref 30.0–36.0)
MCV: 92.3 fL (ref 80.0–100.0)
NRBC: 0 % (ref 0.0–0.2)
PLATELETS: 280 10*3/uL (ref 150–400)
RBC: 5.49 MIL/uL — ABNORMAL HIGH (ref 3.87–5.11)
RDW: 13 % (ref 11.5–15.5)
WBC: 13.7 10*3/uL — ABNORMAL HIGH (ref 4.0–10.5)

## 2018-02-23 LAB — TROPONIN I
Troponin I: <0.03 ng/mL (ref <0.03)
Troponin I: <0.03 ng/mL (ref <0.03)

## 2018-02-23 MED ORDER — ASPIRIN 81 MG PO CHEW
324.0000 mg | CHEWABLE_TABLET | Freq: Once | ORAL | Status: AC
  Administered 2018-02-23: 324 mg via ORAL
  Filled 2018-02-23: qty 4

## 2018-02-23 MED ORDER — IOHEXOL 300 MG/ML  SOLN
75.0000 mL | Freq: Once | INTRAMUSCULAR | Status: AC | PRN
  Administered 2018-02-23: 75 mL via INTRAVENOUS

## 2018-02-23 MED ORDER — MORPHINE SULFATE (PF) 4 MG/ML IV SOLN
4.0000 mg | Freq: Once | INTRAVENOUS | Status: AC
  Administered 2018-02-23: 4 mg via INTRAVENOUS
  Filled 2018-02-23: qty 1

## 2018-02-23 MED ORDER — ACETAMINOPHEN ER 650 MG PO TBCR: 650.0000 mg | EXTENDED_RELEASE_TABLET | Freq: Three times a day (TID) | ORAL | 0 refills | Status: DC

## 2018-02-23 MED ORDER — HYDROCODONE-ACETAMINOPHEN 5-325 MG PO TABS: 1.0000 | ORAL_TABLET | Freq: Three times a day (TID) | ORAL | 0 refills | Status: AC | PRN

## 2018-02-23 NOTE — Discharge Instructions (Addendum)
We saw in the ER for chest pain. Unfortunately , the CT scan of your chest does indicate that you likely have lung cancer.  We are providing you with the phone number for one of the oncologist that works at the [place] center.  You may call the number provided and ask for any cancer doctor that specializes in lung cancer for further care. The patient the very best in your fight against cancer.

## 2018-02-23 NOTE — ED Triage Notes (Signed)
Patient complaining of severe chest pain radiating into bilateral jaws with shortness of breath starting after leaving work today at [employer].

## 2018-02-23 NOTE — ED Provider Notes (Signed)
[facility] EMERGENCY DEPARTMENT Provider Note   CSN: [id number] Arrival date & time: 02/23/18  1556     History   Chief Complaint Chief Complaint  Patient presents with  . Chest Pain    HPI Sharon Berg is a 52 y.o. female.  HPI 52 year old female with history of anxiety comes in with chief complaint of chest pain. Patient reports that while she was at work she started having severe chest pain that was radiating to her jaw.  She had associated shortness of breath.  The pain started around 315 and lasted for 45 minutes.  Since then she has had intermittent episodes of pain.  She denies any history of similar pain in the past.  Patient smokes about pack and a half a day, and she has been smoking heavily for 30+ years.  She denies any substance abuse.  Patient does not have any underlying lung disease and she has no known coronary artery disease, hypertension or diabetes.   Past Medical History:  Diagnosis Date  . Anxiety   . Depression   . Migraine     Patient Active Problem List   Diagnosis Date Noted  . Seasonal affective disorder ([place]) 04/01/2017  . Seizure-like activity ([place]) 08/10/2016  . Impaired fasting glucose 11/11/2015  . Tension headache 12/18/2013  . 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 Anxiety and depression 07/30/2013    Past Surgical History:  Procedure Laterality Date  . ABDOMINAL HYSTERECTOMY  2000     OB History   None      Home Medications    Prior to Admission medications   Medication Sig Start Date End Date Taking? Authorizing Provider  ALPRAZolam (XANAX) 1 MG tablet TAKE 1/2 TO 1 TABLET TWICE A DAY FOR ANXIETY Patient taking differently: Take 0.5-1 mg by mouth 2 (two) times daily. TAKE 1/2 TO 1 TABLET TWICE A DAY FOR ANXIETY 12/15/17  Yes [name], MD  ibuprofen (ADVIL,MOTRIN) 200 MG tablet Take 800 mg by mouth every 6 (six) hours as needed for moderate pain.   Yes [provider]  sertraline (ZOLOFT) 100 MG  tablet TAKE 1 AND 1/2 TABLETS ONCE DAILY Patient taking differently: Take 150 mg by mouth daily.  12/15/17  Yes [name], MD  traZODone (DESYREL) 100 MG tablet Take 3 tablets (300 mg total) by mouth at bedtime. 12/15/17  Yes [name], MD  acetaminophen (TYLENOL 8 HOUR) 650 MG CR tablet Take 1 tablet (650 mg total) by mouth every 8 (eight) hours. 02/23/18   Varney Biles, MD  HYDROcodone-acetaminophen (NORCO/VICODIN) 5-325 MG tablet Take 1 tablet by mouth every 8 (eight) hours as needed for up to 3 days for severe pain. 02/23/18 02/26/18  Varney Biles, MD    Family History Family History  Problem Relation Age of Onset  . Hypertension Mother   . Heart disease Maternal Grandmother   . Stroke Maternal Grandmother   . Heart disease Maternal Grandfather   . Dementia Paternal Grandmother   . Dementia Paternal Grandfather     Social History Social History   Tobacco Use  . Smoking status: Current Every Day Smoker    Packs/day: 1.50    Years: 25.00    Pack years: 37.50    Types: Cigarettes  . Smokeless tobacco: Never Used  Substance Use Topics  . Alcohol use: No    Alcohol/week: 0.0 standard drinks  . Drug use: No     Allergies   Levaquin [levofloxacin] and Codeine  Review of Systems Review of Systems  Constitutional: Positive for activity change.  Respiratory: Positive for shortness of breath.   Cardiovascular: Positive for chest pain.  Gastrointestinal: Negative for nausea and vomiting.  Hematological: Does not bruise/bleed easily.     Physical Exam Updated Vital Signs BP 115/69   Pulse 67   Temp 98.2 F (36.8 C) (Oral)   Resp 12   Ht 5\' 6"  (1.676 m)   Wt 65.8 kg   SpO2 95%   BMI 23.40 kg/m   Physical Exam  Constitutional: She is oriented to person, place, and time. She appears well-developed.  HENT:  Head: Normocephalic and atraumatic.  Eyes: EOM are normal.  Neck: Normal range of motion. Neck supple.  Cardiovascular: Normal rate, intact  distal pulses and normal pulses.  Pulses:      Radial pulses are 2+ on the right side, and 2+ on the left side.  Pulmonary/Chest: Effort normal. No respiratory distress. She has no decreased breath sounds. She has no wheezes. She has no rhonchi. She has no rales.  Abdominal: Bowel sounds are normal.  Neurological: She is alert and oriented to person, place, and time.  Skin: Skin is warm and dry.  Nursing note and vitals reviewed.    ED Treatments / Results  Labs (all labs ordered are listed, but only abnormal results are displayed) Labs Reviewed  BASIC METABOLIC PANEL - Abnormal; Notable for the following components:      Result Value   Potassium 3.4 (*)    All other components within normal limits  CBC - Abnormal; Notable for the following components:   WBC 13.7 (*)    RBC 5.49 (*)    Hemoglobin 16.5 (*)    HCT 50.7 (*)    All other components within normal limits  TROPONIN I  TROPONIN I    EKG EKG Interpretation  Date/Time:  Wednesday February 23 2018 16:05:32 EST Ventricular Rate:  72 PR Interval:    QRS Duration: 76 QT Interval:  393 QTC Calculation: 431 R Axis:   82 Text Interpretation:  Sinus rhythm No acute changes No significant change since last tracing Confirmed by Varney Biles ([id number]) on 02/23/2018 4:38:03 PM   Radiology Dg Chest 2 View  Result Date: 02/23/2018 CLINICAL DATA:  52 year old female with sudden onset chest and jaw pain at 1530 hours today. Smoker. Weakness. EXAM: CHEST - 2 VIEW COMPARISON:  Portable chest 08/06/2016 and earlier. FINDINGS: Chronic but increased pulmonary interstitial opacity compared to 2015 radiographs. Since April there is new vague right suprahilar density, which seems masslike on the lateral view (arrows). This could measure up to the 4.4 centimeters. Other mediastinal contours are stable and within normal limits. Visualized tracheal air column is within normal limits. No pneumothorax, pleural effusion or other confluent  pulmonary opacity. No acute osseous abnormality identified. Paucity of bowel gas in the upper abdomen. IMPRESSION: 1. New indistinct but mass-like right suprahilar density, suspicious for bronchogenic carcinoma. Recommend Chest CT (IV contrast preferred) to further characterize. 2. Progressed pulmonary interstitial opacity since 2015, probably smoking related. Electronically Signed   By: Genevie Ann M.D.   On: 02/23/2018 16:34   Ct Chest W Contrast  Result Date: 02/23/2018 CLINICAL DATA:  chest and jaw pain. Shortness of breath. Heavy smoker. EXAM: CT CHEST WITH CONTRAST TECHNIQUE: Multidetector CT imaging of the chest was performed during intravenous contrast administration. CONTRAST:  15mL OMNIPAQUE IOHEXOL 300 MG/ML  SOLN COMPARISON:  Chest radiograph of earlier today.  No prior CT. FINDINGS: Cardiovascular:  Aortic atherosclerosis. Normal heart size, without pericardial effusion. No central pulmonary embolism, on this non-dedicated study. Mediastinum/Nodes: No supraclavicular adenopathy. Partially necrotic posterior right mediastinal nodal mass of 3.2 x 2.9 cm on image 51/2. This is intimately associated with the T4 vertebral body, without gross osseous involvement. Right hilar adenopathy at 2.0 cm on image 67/2. Precarinal necrotic node of 9 mm on image 58/2. Lungs/Pleura: No pleural fluid. Mass-effect upon right upper lobe bronchus with compression or involvement of the posterior segmental bronchus, including on image 59/4. Moderate bullous type emphysema with centrilobular and paraseptal components. Favor pulmonary parenchymal primary lung lesion involving the central superior segment right lower lobe. Example 2.8 x 2.4 cm on image 61/4. More patchy central right upper lobe consolidation including on image 53/4. Clear left lung. Upper Abdomen: Normal imaged portions of the liver, spleen, stomach, pancreas, gallbladder, adrenal glands, kidneys. Musculoskeletal: Moderate thoracic spondylosis. Mild T11  compression deformity without vertebral canal encroachment. IMPRESSION: 1. Necrotic adenopathy within the mediastinum and right hilum, likely representing metastatic primary bronchogenic carcinoma. A central superior segment right lower lobe nodule likely represents the primary. This could alternatively represent another focus of nodal metastasis. 2. Right upper lobe central consolidation is favored to represent postobstructive pneumonitis. 3. The dominant posterior mediastinal lesion is intimately associated with the fourth right rib, without gross osseous destruction. 4. Aortic atherosclerosis (ICD10-I70.0) and emphysema (ICD10-J43.9). Recommend multidisciplinary referral for PET and/or tissue sampling. Electronically Signed   By: Abigail Miyamoto M.D.   On: 02/23/2018 19:38    Procedures Procedures (including critical care time)  Medications Ordered in ED Medications  morphine 4 MG/ML injection 4 mg (4 mg Intravenous Given 02/23/18 1746)  aspirin chewable tablet 324 mg (324 mg Oral Given 02/23/18 1745)  iohexol (OMNIPAQUE) 300 MG/ML solution 75 mL (75 mLs Intravenous Contrast Given 02/23/18 1833)  morphine 4 MG/ML injection 4 mg (4 mg Intravenous Given 02/23/18 1919)     Initial Impression / Assessment and Plan / ED Course  I have reviewed the triage vital signs and the nursing notes.  Pertinent labs & imaging results that were available during my care of the patient were reviewed by me and considered in my medical decision making (see chart for details).  Clinical Course as of Feb 24 2103  Wed Feb 23, 2018  1700 X-ray is concerning for possible tumor.  Upon further questioning patient reports that she has had intermittent episodes of night sweats over the past 2 months and also hemoptysis over the last 2 weeks.  I discussed the x-ray findings and informed her that there is a possibility she might have a lung cancer.  Patient has agreed to proceed with CT chest with contrast so that she can be  optimally diagnosed and sent to a cancer doctor if needed.  DG Chest 2 View [AN]  2102 Results from the ER workup discussed with the patient face to face and all questions answered to the best of my ability.  She will follow-up with the cancer team.   CT Chest W Contrast [AN]    Clinical Course User Index [AN] Varney Biles, MD    52 year old female comes in with chief complaint of chest pain.  Chest pain was anterior, with radiation to her jaw.  Patient had associated shortness of breath.  She has heavy smoking history otherwise does not have significant cardiac risk factors.  EKG is not showing any concerning findings.  Final Clinical Impressions(s) / ED Diagnoses   Final diagnoses:  Malignant neoplasm of  lung, unspecified laterality, unspecified part of lung [facility])    ED Discharge Orders         Ordered    HYDROcodone-acetaminophen (NORCO/VICODIN) 5-325 MG tablet  Every 8 hours PRN     02/23/18 2056    acetaminophen (TYLENOL 8 HOUR) 650 MG CR tablet  Every 8 hours     02/23/18 2056           Varney Biles, MD 02/23/18 2105

## 2018-02-28 ENCOUNTER — Other Ambulatory Visit (HOSPITAL_COMMUNITY): Payer: Self-pay | Admitting: *Deleted

## 2018-02-28 ENCOUNTER — Inpatient Hospital Stay (HOSPITAL_COMMUNITY): Payer: 59 | Attending: Hematology | Admitting: Hematology

## 2018-02-28 ENCOUNTER — Encounter (HOSPITAL_COMMUNITY): Payer: Self-pay | Admitting: Hematology

## 2018-02-28 VITALS — BP 120/84 | HR 91 | Temp 98.7°F | Resp 16 | Ht 64.5 in | Wt 144.0 lb

## 2018-02-28 DIAGNOSIS — Z720 Tobacco use: Secondary | ICD-10-CM

## 2018-02-28 DIAGNOSIS — R569 Unspecified convulsions: Secondary | ICD-10-CM | POA: Diagnosis not present

## 2018-02-28 DIAGNOSIS — R918 Other nonspecific abnormal finding of lung field: Secondary | ICD-10-CM

## 2018-02-28 DIAGNOSIS — Z79899 Other long term (current) drug therapy: Secondary | ICD-10-CM | POA: Diagnosis not present

## 2018-02-28 DIAGNOSIS — F17210 Nicotine dependence, cigarettes, uncomplicated: Secondary | ICD-10-CM | POA: Diagnosis not present

## 2018-02-28 DIAGNOSIS — C3491 Malignant neoplasm of unspecified part of right bronchus or lung: Secondary | ICD-10-CM | POA: Diagnosis present

## 2018-02-28 DIAGNOSIS — C3431 Malignant neoplasm of lower lobe, right bronchus or lung: Secondary | ICD-10-CM

## 2018-02-28 DIAGNOSIS — C3490 Malignant neoplasm of unspecified part of unspecified bronchus or lung: Secondary | ICD-10-CM

## 2018-02-28 MED ORDER — HYDROCODONE-ACETAMINOPHEN 5-325 MG PO TABS: 1.0000 | ORAL_TABLET | Freq: Four times a day (QID) | ORAL | 0 refills | Status: DC | PRN

## 2018-02-28 MED ORDER — NICOTINE 21 MG/24HR TD PT24: 21.0000 mg | MEDICATED_PATCH | Freq: Every day | TRANSDERMAL | 1 refills | Status: DC

## 2018-02-28 NOTE — Assessment & Plan Note (Signed)
1.  Most likely right lung cancer: - She has been having chest pain which started in August when she had pneumonia. -She went to the ER on 02/23/2018 when a CT of the chest was done which showed necrotic adenopathy within the mediastinum and right hilum.  A central superior segment right lower lobe nodule likely represents the primary. - She has a 57-pack-year smoking history. -She lost about 6 pounds in the last couple of months. - She does have hemoptysis with bloodstained sputum for the past couple of months. - I have discussed with the patient and her family and reviewed scans with her.  I have recommended PET CT scan for staging purposes.  We will also order MRI of the brain with and without contrast. - I have recommended to her to be seen by Dr. Roxan Hockey for bronchoscopy and biopsy. -I will see her back after the biopsy to discuss the results and further plan.  2.  Anterior chest pain: - She is currently taking hydrocodone 5 mg every 6 hours as needed.  We have renewed her prescription.  This regimen is helping.  3.  I have counseled her for smoking cessation.  We will send a prescription for nicotine patch.

## 2018-02-28 NOTE — Progress Notes (Signed)
[facility] CONSULT NOTE  Patient Care Team: [name], MD as PCP - General (Family Medicine)  CHIEF COMPLAINTS/PURPOSE OF CONSULTATION:  Likely lung cancer.  HISTORY OF PRESENTING ILLNESS:  Sharon Berg 52 y.o. female is seen in consultation today for further work-up and management of right lung cancer.  She has been having chest pain since August when she had pneumonia.  As the pain got worse she went to the ER on 02/23/2018.  CT of the chest with contrast showed necrotic adenopathy within the mediastinum and right hilum with a central superior segment right lower lobe nodule likely representing primary.  She reports having lost about 6 pounds in the last 2 months.  She also reports having hemoptysis for 2 months.  She has bloodstained sputum.  She smoked 1 and half pack per day of cigarettes for 38 years.  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  No chemical exposure.  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  She also has chronic headaches but denies any worsening.  Denies any nausea or vision changes.  No family history of malignancies.  She is accompanied by her family members today.  Denies any recent hospitalizations or infections.  MEDICAL HISTORY:  Past Medical History:  Diagnosis Date  . Anxiety   . Depression   . Lung cancer ([place])   . Migraine   . Seizures ([place])    stressed induced    SURGICAL HISTORY: Past Surgical History:  Procedure Laterality Date  . ABDOMINAL HYSTERECTOMY  2000    SOCIAL HISTORY: Social History   Socioeconomic History  . Marital status: Married    Spouse name: [name]  . Number of children: 1  . Years of education: College  . Highest education level: Not on file  Occupational History  . 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 Financial resource strain: Not hard at all  . Food insecurity:    Worry: Never true     Inability: Never true  . Transportation needs:    Medical: No    Non-medical: No  Tobacco Use  . Smoking status: Current Every Day Smoker    Packs/day: 1.50    Years: 38.00    Pack years: 57.00    Types: Cigarettes  . Smokeless tobacco: Never Used  Substance and Sexual Activity  . Alcohol use: No    Alcohol/week: 0.0 standard drinks  . Drug use: No  . Sexual activity: Yes    Birth control/protection: Surgical  Lifestyle  . Physical activity:    Days per week: 0 days    Minutes per session: 0 min  . Stress: Rather much  Relationships  . Social connections:    Talks on phone: More than three times a week    Gets together: Once a week    Attends religious service: Never    Active member of club or organization: No    Attends meetings of clubs or organizations: Never    Relationship status: Married  . Intimate partner violence:    Fear of current or ex partner: No    Emotionally abused: No    Physically abused: No    Forced sexual activity: No  Other Topics Concern  . Not on file  Social History Narrative   Patient is married with one child.   Patient is right handed.   Patient  has college education.   Caffeine use: Patient drinks 1 glass of tea daily.    FAMILY HISTORY: Family History  Problem Relation Age of Onset  . Hypertension Mother   . Diabetes Father   . Heart disease Maternal Grandmother   . Stroke Maternal Grandmother   . Heart disease Maternal Grandfather   . Dementia Paternal Grandmother   . Dementia Paternal Grandfather     ALLERGIES:  is allergic to levaquin [levofloxacin] and codeine.  MEDICATIONS:  Current Outpatient Medications  Medication Sig Dispense Refill  . acetaminophen (TYLENOL 8 HOUR) 650 MG CR tablet Take 1 tablet (650 mg total) by mouth every 8 (eight) hours. 30 tablet 0  . ALPRAZolam (XANAX) 1 MG tablet TAKE 1/2 TO 1 TABLET TWICE A DAY FOR ANXIETY (Patient taking differently: Take 0.5-1 mg by mouth 2 (two) times daily. TAKE 1/2 TO  1 TABLET TWICE A DAY FOR ANXIETY) 60 tablet 5  . ibuprofen (ADVIL,MOTRIN) 200 MG tablet Take 800 mg by mouth every 6 (six) hours as needed for moderate pain.    Marland Kitchen sertraline (ZOLOFT) 100 MG tablet TAKE 1 AND 1/2 TABLETS ONCE DAILY (Patient taking differently: Take 150 mg by mouth daily. ) 45 tablet 5  . traZODone (DESYREL) 100 MG tablet Take 3 tablets (300 mg total) by mouth at bedtime. 90 tablet 5  . HYDROcodone-acetaminophen (NORCO/VICODIN) 5-325 MG tablet Take 1 tablet by mouth every 6 (six) hours as needed for moderate pain. 30 tablet 0  . nicotine (NICODERM CQ - DOSED IN MG/24 HOURS) 21 mg/24hr patch Place 1 patch (21 mg total) onto the skin daily. 28 patch 1   No current facility-administered medications for this visit.     REVIEW OF SYSTEMS:   Constitutional: Denies fevers, chills or abnormal night sweats.  Positive for fatigue. Eyes: Denies blurriness of vision, double vision or watery eyes Ears, nose, mouth, throat, and face: Denies mucositis or sore throat Respiratory: Positive for cough and hemoptysis. Cardiovascular: Denies palpitation,  or lower extremity swelling.  Positive for chest pain. Gastrointestinal:  Denies nausea, heartburn or change in bowel habits Skin: Denies abnormal skin rashes Lymphatics: Denies new lymphadenopathy or easy bruising Neurological:Denies numbness, tingling or new weaknesses Behavioral/Psych: Mood is stable, no new changes  All other systems were reviewed with the patient and are negative.  PHYSICAL EXAMINATION: ECOG PERFORMANCE STATUS: 0 - Asymptomatic  Vitals:   02/28/18 1411  BP: 120/84  Pulse: 91  Resp: 16  Temp: 98.7 F (37.1 C)  SpO2: 98%   Filed Weights   02/28/18 1411  Weight: 144 lb (65.3 kg)    GENERAL:alert, no distress and comfortable SKIN: skin color, texture, turgor are normal, no rashes or significant lesions EYES: normal, conjunctiva are pink and non-injected, sclera clear OROPHARYNX:no exudate, no erythema and lips,  buccal mucosa, and tongue normal  NECK: supple, thyroid normal size, non-tender, without nodularity LYMPH:  no palpable lymphadenopathy in the cervical, axillary or inguinal LUNGS: clear to auscultation and percussion with normal breathing effort HEART: regular rate & rhythm and no murmurs and no lower extremity edema ABDOMEN:abdomen soft, non-tender and normal bowel sounds Musculoskeletal:no cyanosis of digits and no clubbing  PSYCH: alert & oriented x 3 with fluent speech NEURO: no focal motor/sensory deficits  LABORATORY DATA:  I have reviewed the data as listed Lab Results  Component Value Date   WBC 13.7 (H) 02/23/2018   HGB 16.5 (H) 02/23/2018   HCT 50.7 (H) 02/23/2018   MCV 92.3 02/23/2018   PLT  280 02/23/2018     Chemistry      Component Value Date/Time   NA 137 02/23/2018 1608   NA 141 09/25/2017 0829   K 3.4 (L) 02/23/2018 1608   CL 106 02/23/2018 1608   CO2 22 02/23/2018 1608   BUN 17 02/23/2018 1608   BUN 17 09/25/2017 0829   CREATININE 0.61 02/23/2018 1608      Component Value Date/Time   CALCIUM 9.4 02/23/2018 1608   ALKPHOS 95 09/25/2017 0829   AST 19 09/25/2017 0829   ALT 17 09/25/2017 0829   BILITOT 0.4 09/25/2017 0829       RADIOGRAPHIC STUDIES: I have personally reviewed the radiological images as listed and agreed with the findings in the report. Dg Chest 2 View  Result Date: 02/23/2018 CLINICAL DATA:  52 year old female with sudden onset chest and jaw pain at 1530 hours today. Smoker. Weakness. EXAM: CHEST - 2 VIEW COMPARISON:  Portable chest 08/06/2016 and earlier. FINDINGS: Chronic but increased pulmonary interstitial opacity compared to 2015 radiographs. Since April there is new vague right suprahilar density, which seems masslike on the lateral view (arrows). This could measure up to the 4.4 centimeters. Other mediastinal contours are stable and within normal limits. Visualized tracheal air column is within normal limits. No pneumothorax, pleural  effusion or other confluent pulmonary opacity. No acute osseous abnormality identified. Paucity of bowel gas in the upper abdomen. IMPRESSION: 1. New indistinct but mass-like right suprahilar density, suspicious for bronchogenic carcinoma. Recommend Chest CT (IV contrast preferred) to further characterize. 2. Progressed pulmonary interstitial opacity since 2015, probably smoking related. Electronically Signed   By: Genevie Ann M.D.   On: 02/23/2018 16:34   Ct Chest W Contrast  Result Date: 02/23/2018 CLINICAL DATA:  chest and jaw pain. Shortness of breath. Heavy smoker. EXAM: CT CHEST WITH CONTRAST TECHNIQUE: Multidetector CT imaging of the chest was performed during intravenous contrast administration. CONTRAST:  4mL OMNIPAQUE IOHEXOL 300 MG/ML  SOLN COMPARISON:  Chest radiograph of earlier today.  No prior CT. FINDINGS: Cardiovascular: Aortic atherosclerosis. Normal heart size, without pericardial effusion. No central pulmonary embolism, on this non-dedicated study. Mediastinum/Nodes: No supraclavicular adenopathy. Partially necrotic posterior right mediastinal nodal mass of 3.2 x 2.9 cm on image 51/2. This is intimately associated with the T4 vertebral body, without gross osseous involvement. Right hilar adenopathy at 2.0 cm on image 67/2. Precarinal necrotic node of 9 mm on image 58/2. Lungs/Pleura: No pleural fluid. Mass-effect upon right upper lobe bronchus with compression or involvement of the posterior segmental bronchus, including on image 59/4. Moderate bullous type emphysema with centrilobular and paraseptal components. Favor pulmonary parenchymal primary lung lesion involving the central superior segment right lower lobe. Example 2.8 x 2.4 cm on image 61/4. More patchy central right upper lobe consolidation including on image 53/4. Clear left lung. Upper Abdomen: Normal imaged portions of the liver, spleen, stomach, pancreas, gallbladder, adrenal glands, kidneys. Musculoskeletal: Moderate thoracic  spondylosis. Mild T11 compression deformity without vertebral canal encroachment. IMPRESSION: 1. Necrotic adenopathy within the mediastinum and right hilum, likely representing metastatic primary bronchogenic carcinoma. A central superior segment right lower lobe nodule likely represents the primary. This could alternatively represent another focus of nodal metastasis. 2. Right upper lobe central consolidation is favored to represent postobstructive pneumonitis. 3. The dominant posterior mediastinal lesion is intimately associated with the fourth right rib, without gross osseous destruction. 4. Aortic atherosclerosis (ICD10-I70.0) and emphysema (ICD10-J43.9). Recommend multidisciplinary referral for PET and/or tissue sampling. Electronically Signed   By: Marylyn Ishihara  Jobe Igo M.D.   On: 02/23/2018 19:38    ASSESSMENT & PLAN:  Cancer of right lung ([place]) 1.  Most likely right lung cancer: - She has been having chest pain which started in August when she had pneumonia. -She went to the ER on 02/23/2018 when a CT of the chest was done which showed necrotic adenopathy within the mediastinum and right hilum.  A central superior segment right lower lobe nodule likely represents the primary. - She has a 57-pack-year smoking history. -She lost about 6 pounds in the last couple of months. - She does have hemoptysis with bloodstained sputum for the past couple of months. - I have discussed with the patient and her family and reviewed scans with her.  I have recommended PET CT scan for staging purposes.  We will also order MRI of the brain with and without contrast. - I have recommended to her to be seen by Dr. Roxan Hockey for bronchoscopy and biopsy. -I will see her back after the biopsy to discuss the results and further plan.  2.  Anterior chest pain: - She is currently taking hydrocodone 5 mg every 6 hours as needed.  We have renewed her prescription.  This regimen is helping.  3.  I have counseled her for smoking  cessation.  We will send a prescription for nicotine patch.  Orders Placed This Encounter  Procedures  . MR Brain W Wo Contrast    Standing Status:   Future    Standing Expiration Date:   02/28/2019    Order Specific Question:   If indicated for the ordered procedure, I authorize the administration of contrast media per Radiology protocol    Answer:   Yes    Order Specific Question:   What is the patient's sedation requirement?    Answer:   No Sedation    Order Specific Question:   Does the patient have a pacemaker or implanted devices?    Answer:   No    Order Specific Question:   Use SRS Protocol?    Answer:   No    Order Specific Question:   Call Results- Best Contact Number?    Answer:   lung cancer    Order Specific Question:   Radiology Contrast Protocol - do NOT remove file path    Answer:   \\[email or web address]    Order Specific Question:   Preferred imaging location?    Answer:   [facility] (table limit-350lbs)  . NM PET Image Initial (PI) Skull Base To Thigh    Standing Status:   Future    Standing Expiration Date:   02/28/2019    Order Specific Question:   If indicated for the ordered procedure, I authorize the administration of a radiopharmaceutical per Radiology protocol    Answer:   Yes    Order Specific Question:   Is the patient pregnant?    Answer:   No    Order Specific Question:   Preferred imaging location?    Answer:   [facility]    Order Specific Question:   Radiology Contrast Protocol - do NOT remove file path    Answer:   \\[email or web address]  . Ambulatory referral to Social Work    Referral Priority:   Routine    Referral Type:   Consultation    Referral Reason:   Specialty Services Required    Number of Visits Requested:   1    All questions were answered. The patient knows to call  the clinic with any problems, questions or concerns.     Derek [name], MD 02/28/2018 4:17  PM

## 2018-03-01 ENCOUNTER — Encounter: Payer: Self-pay | Admitting: General Practice

## 2018-03-01 NOTE — Progress Notes (Signed)
[place] Psychosocial Distress Screening Clinical Social Work  Clinical Social Work was referred by distress screening protocol.  The patient scored a 9 on the Psychosocial Distress Thermometer which indicates severe distress. Clinical Social Worker contacted patient by phone to assess for distress and other psychosocial needs. Attempted calls to patient on mobile number (VM full, no answer) and home (call unable to be completed).  CSW will try again to contact patient, unable to leave message.  ONCBCN DISTRESS SCREENING 02/28/2018  Screening Type Initial Screening  Distress experienced in past week (1-10) 9  Emotional problem type Nervousness/Anxiety;Adjusting to illness;Boredom  Information Concerns Type Lack of info about diagnosis;Lack of info about treatment;Lack of info about complementary therapy choices  Physical Problem type Pain;Loss of appetitie     Clinical Social Worker follow up needed: Yes.    If yes, follow up plan:  Beverely Pace, [place], LCSW Clinical Social Worker Phone:  [phone]

## 2018-03-03 ENCOUNTER — Telehealth (HOSPITAL_COMMUNITY): Payer: Self-pay | Admitting: General Practice

## 2018-03-03 NOTE — Telephone Encounter (Signed)
Forestine Na CSW Progress Notes  [place] Psychosocial Distress Screening Clinical Social Work  Clinical Social Work was referred by distress screening protocol.  The patient scored a 9 on the Psychosocial Distress Thermometer which indicates severe distress. Clinical Social Worker contacted patient by phone to assess for distress and other psychosocial needs. Pt is understandably anxious as she awaits the results of various testing procedures.  Feels supported by medical care team but is also anxious to have answers and treatment plan in place.  Supportive family, husband works second shift but patient has others who are available to her during the day.  Reviewed various resources for support available through [facility] and community resources, mailed packet of information and encouraged patient to call as needed.    ONCBCN DISTRESS SCREENING 02/28/2018  Screening Type Initial Screening  Distress experienced in past week (1-10) 9  Emotional problem type Nervousness/Anxiety;Adjusting to illness;Boredom  Information Concerns Type Lack of info about diagnosis;Lack of info about treatment;Lack of info about complementary therapy choices  Physical Problem type Pain;Loss of appetitie    Clinical Social Worker follow up needed: No.  If yes, follow up plan:  Beverely Pace, [place], LCSW Clinical Social Worker Phone:  [phone]

## 2018-03-04 ENCOUNTER — Other Ambulatory Visit (HOSPITAL_COMMUNITY): Payer: Self-pay | Admitting: Hematology

## 2018-03-08 ENCOUNTER — Ambulatory Visit (HOSPITAL_COMMUNITY)
Admission: RE | Admit: 2018-03-08 | Discharge: 2018-03-08 | Disposition: A | Discharge status: Home / Self Care | Payer: 59 | Source: Ambulatory Visit | Attending: Hematology | Admitting: Hematology

## 2018-03-08 DIAGNOSIS — C3431 Malignant neoplasm of lower lobe, right bronchus or lung: Secondary | ICD-10-CM | POA: Diagnosis not present

## 2018-03-08 MED ORDER — GADOBUTROL 1 MMOL/ML IV SOLN
6.0000 mL | Freq: Once | INTRAVENOUS | Status: AC | PRN
  Administered 2018-03-08: 6 mL via INTRAVENOUS

## 2018-03-10 ENCOUNTER — Encounter: Payer: Self-pay | Admitting: General Surgery

## 2018-03-10 ENCOUNTER — Ambulatory Visit: Payer: 59 | Admitting: General Surgery

## 2018-03-10 VITALS — BP 119/71 | HR 81 | Temp 97.5°F | Resp 20 | Wt 143.4 lb

## 2018-03-10 DIAGNOSIS — C3491 Malignant neoplasm of unspecified part of right bronchus or lung: Secondary | ICD-10-CM

## 2018-03-10 NOTE — H&P (Signed)
Sharon Berg; [id number]; Dec 05, 1965   HPI Patient is a 52 year old white female who was referred to my care by Dr. Delton Coombes for Port-A-Cath placement.  She is about to undergo chemotherapy for a right lung mass.  She is scheduled to have a biopsy of the right lung mass next week.  She currently has a pain level of 8 out of 10 related to that mass.  She denies any fever or chills. Past Medical History:  Diagnosis Date  . Anxiety   . Depression   . Lung cancer ([place])   . Migraine   . Seizures ([place])    stressed induced    Past Surgical History:  Procedure Laterality Date  . ABDOMINAL HYSTERECTOMY  2000    Family History  Problem Relation Age of Onset  . Hypertension Mother   . Diabetes Father   . Heart disease Maternal Grandmother   . Stroke Maternal Grandmother   . Heart disease Maternal Grandfather   . Dementia Paternal Grandmother   . Dementia Paternal Grandfather     Current Outpatient Medications on File Prior to Visit  Medication Sig Dispense Refill  . acetaminophen (TYLENOL 8 HOUR) 650 MG CR tablet Take 1 tablet (650 mg total) by mouth every 8 (eight) hours. 30 tablet 0  . ALPRAZolam (XANAX) 1 MG tablet TAKE 1/2 TO 1 TABLET TWICE A DAY FOR ANXIETY (Patient taking differently: Take 0.5-1 mg by mouth 2 (two) times daily. TAKE 1/2 TO 1 TABLET TWICE A DAY FOR ANXIETY) 60 tablet 5  . HYDROcodone-acetaminophen (NORCO/VICODIN) 5-325 MG tablet Take 1 tablet by mouth every 6 (six) hours as needed for moderate pain. 30 tablet 0  . ibuprofen (ADVIL,MOTRIN) 200 MG tablet Take 800 mg by mouth every 6 (six) hours as needed for moderate pain.    . nicotine (NICODERM CQ - DOSED IN MG/24 HOURS) 21 mg/24hr patch Place 1 patch (21 mg total) onto the skin daily. 28 patch 1  . sertraline (ZOLOFT) 100 MG tablet TAKE 1 AND 1/2 TABLETS ONCE DAILY (Patient taking differently: Take 150 mg by mouth daily. ) 45 tablet 5  . traZODone (DESYREL) 100 MG tablet Take 3 tablets (300 mg total) by mouth at  bedtime. 90 tablet 5   No current facility-administered medications on file prior to visit.     Allergies  Allergen Reactions  . Levaquin [Levofloxacin] Other (See Comments)    Myalgia  . Codeine Nausea And Vomiting    Social History   Substance and Sexual Activity  Alcohol Use No  . Alcohol/week: 0.0 standard drinks    Social History   Tobacco Use  Smoking Status Current Every Day Smoker  . Packs/day: 1.50  . Years: 38.00  . Pack years: 57.00  . Types: Cigarettes  Smokeless Tobacco Never Used    Review of Systems  Constitutional: Positive for malaise/fatigue.  HENT: Negative.   Eyes: Negative.   Respiratory: Negative.   Cardiovascular: Negative.   Gastrointestinal: Negative.   Genitourinary: Negative.   Musculoskeletal: Positive for back pain.  Skin: Negative.   Neurological: Negative.   Endo/Heme/Allergies: Negative.   Psychiatric/Behavioral: Negative.     Objective   Vitals:   03/10/18 1022  BP: 119/71  Pulse: 81  Resp: 20  Temp: (!) 97.5 F (36.4 C)    Physical Exam  Constitutional: She is oriented to person, place, and time. She appears well-developed and well-nourished. No distress.  HENT:  Head: Normocephalic and atraumatic.  Cardiovascular: Normal rate, regular rhythm and normal heart sounds.  Exam reveals no gallop and no friction rub.  No murmur heard. Pulmonary/Chest: Effort normal and breath sounds normal. No stridor. No respiratory distress. She has no wheezes. She has no rales.  Neurological: She is alert and oriented to person, place, and time.  Skin: Skin is warm and dry.  Vitals reviewed. Oncology notes reviewed  Assessment  Right lung neoplasm, need for central venous access Plan   Patient is scheduled for Port-A-Cath insertion on 03/14/2018.  The risks and benefits of the procedure including bleeding, infection, and pneumothorax were fully explained to the patient, who gave informed consent.

## 2018-03-10 NOTE — Progress Notes (Signed)
Sharon Berg; [id number]; 08-Jan-1966   HPI Patient is a 52 year old white female who was referred to my care by Dr. Delton Coombes for Port-A-Cath placement.  She is about to undergo chemotherapy for a right lung mass.  She is scheduled to have a biopsy of the right lung mass next week.  She currently has a pain level of 8 out of 10 related to that mass.  She denies any fever or chills. Past Medical History:  Diagnosis Date  . Anxiety   . Depression   . Lung cancer ([place])   . Migraine   . Seizures ([place])    stressed induced    Past Surgical History:  Procedure Laterality Date  . ABDOMINAL HYSTERECTOMY  2000    Family History  Problem Relation Age of Onset  . Hypertension Mother   . Diabetes Father   . Heart disease Maternal Grandmother   . Stroke Maternal Grandmother   . Heart disease Maternal Grandfather   . Dementia Paternal Grandmother   . Dementia Paternal Grandfather     Current Outpatient Medications on File Prior to Visit  Medication Sig Dispense Refill  . acetaminophen (TYLENOL 8 HOUR) 650 MG CR tablet Take 1 tablet (650 mg total) by mouth every 8 (eight) hours. 30 tablet 0  . ALPRAZolam (XANAX) 1 MG tablet TAKE 1/2 TO 1 TABLET TWICE A DAY FOR ANXIETY (Patient taking differently: Take 0.5-1 mg by mouth 2 (two) times daily. TAKE 1/2 TO 1 TABLET TWICE A DAY FOR ANXIETY) 60 tablet 5  . HYDROcodone-acetaminophen (NORCO/VICODIN) 5-325 MG tablet Take 1 tablet by mouth every 6 (six) hours as needed for moderate pain. 30 tablet 0  . ibuprofen (ADVIL,MOTRIN) 200 MG tablet Take 800 mg by mouth every 6 (six) hours as needed for moderate pain.    . nicotine (NICODERM CQ - DOSED IN MG/24 HOURS) 21 mg/24hr patch Place 1 patch (21 mg total) onto the skin daily. 28 patch 1  . sertraline (ZOLOFT) 100 MG tablet TAKE 1 AND 1/2 TABLETS ONCE DAILY (Patient taking differently: Take 150 mg by mouth daily. ) 45 tablet 5  . traZODone (DESYREL) 100 MG tablet Take 3 tablets (300 mg total) by mouth at  bedtime. 90 tablet 5   No current facility-administered medications on file prior to visit.     Allergies  Allergen Reactions  . Levaquin [Levofloxacin] Other (See Comments)    Myalgia  . Codeine Nausea And Vomiting    Social History   Substance and Sexual Activity  Alcohol Use No  . Alcohol/week: 0.0 standard drinks    Social History   Tobacco Use  Smoking Status Current Every Day Smoker  . Packs/day: 1.50  . Years: 38.00  . Pack years: 57.00  . Types: Cigarettes  Smokeless Tobacco Never Used    Review of Systems  Constitutional: Positive for malaise/fatigue.  HENT: Negative.   Eyes: Negative.   Respiratory: Negative.   Cardiovascular: Negative.   Gastrointestinal: Negative.   Genitourinary: Negative.   Musculoskeletal: Positive for back pain.  Skin: Negative.   Neurological: Negative.   Endo/Heme/Allergies: Negative.   Psychiatric/Behavioral: Negative.     Objective   Vitals:   03/10/18 1022  BP: 119/71  Pulse: 81  Resp: 20  Temp: (!) 97.5 F (36.4 C)    Physical Exam  Constitutional: She is oriented to person, place, and time. She appears well-developed and well-nourished. No distress.  HENT:  Head: Normocephalic and atraumatic.  Cardiovascular: Normal rate, regular rhythm and normal heart sounds.  Exam reveals no gallop and no friction rub.  No murmur heard. Pulmonary/Chest: Effort normal and breath sounds normal. No stridor. No respiratory distress. She has no wheezes. She has no rales.  Neurological: She is alert and oriented to person, place, and time.  Skin: Skin is warm and dry.  Vitals reviewed. Oncology notes reviewed  Assessment  Right lung neoplasm, need for central venous access Plan   Patient is scheduled for Port-A-Cath insertion on 03/14/2018.  The risks and benefits of the procedure including bleeding, infection, and pneumothorax were fully explained to the patient, who gave informed consent.

## 2018-03-10 NOTE — Patient Instructions (Signed)
Implanted Port Insertion  Implanted port insertion is a procedure to put in a port and catheter. The port is a device with an injectable disk that can be accessed by your health care provider. The port is connected to a vein in the chest or neck by a small flexible tube (catheter). There are different types of ports. The implanted port may be used as a long-term IV access for:  · Medicines, such as chemotherapy.  · Fluids.  · Liquid nutrition, such as total parenteral nutrition (TPN).  · Blood samples.    Having a port means that your health care provider will not need to use the veins in your arms for these procedures.  Tell a health care provider about:  · Any allergies you have.  · All medicines you are taking, especially blood thinners, as well as any vitamins, herbs, eye drops, creams, over-the-counter medicines, and steroids.  · Any problems you or family members have had with anesthetic medicines.  · Any blood disorders you have.  · Any surgeries you have had.  · Any medical conditions you have, including diabetes or kidney problems.  · Whether you are pregnant or may be pregnant.  What are the risks?  Generally, this is a safe procedure. However, problems may occur, including:  · Allergic reactions to medicines or dyes.  · Damage to other structures or organs.  · Infection.  · Damage to the blood vessel, bruising, or bleeding at the puncture site.  · Blood clot.  · Breakdown of the skin over the port.  · A collection of air in the chest that can cause one of the lungs to collapse (pneumothorax). This is rare.    What happens before the procedure?  Staying hydrated  Follow instructions from your health care provider about hydration, which may include:  · Up to 2 hours before the procedure - you may continue to drink clear liquids, such as water, clear fruit juice, black coffee, and plain tea.    Eating and drinking restrictions  · Follow instructions from your health care provider about eating and drinking,  which may include:  ? 8 hours before the procedure - stop eating heavy meals or foods such as meat, fried foods, or fatty foods.  ? 6 hours before the procedure - stop eating light meals or foods, such as toast or cereal.  ? 6 hours before the procedure - stop drinking milk or drinks that contain milk.  ? 2 hours before the procedure - stop drinking clear liquids.  Medicines  · Ask your health care provider about:  ? Changing or stopping your regular medicines. This is especially important if you are taking diabetes medicines or blood thinners.  ? Taking medicines such as aspirin and ibuprofen. These medicines can thin your blood. Do not take these medicines before your procedure if your health care provider instructs you not to.  · You may be given antibiotic medicine to help prevent infection.  General instructions  · Plan to have someone take you home from the hospital or clinic.  · If you will be going home right after the procedure, plan to have someone with you for 24 hours.  · You may have blood tests.  · You may be asked to shower with a germ-killing soap.  What happens during the procedure?  · To lower your risk of infection:  ? Your health care team will wash or sanitize their hands.  ? Your skin will be washed with   soap.  ? Hair may be removed from the surgical area.  · An IV tube will be inserted into one of your veins.  · You will be given one or more of the following:  ? A medicine to help you relax (sedative).  ? A medicine to numb the area (local anesthetic).  · Two small cuts (incisions) will be made to insert the port.  ? One incision will be made in your neck to get access to the vein where the catheter will lie.  ? The other incision will be made in the upper chest. This is where the port will lie.  · The procedure may be done using continuous X-ray (fluoroscopy) or other imaging tools for guidance.  · The port and catheter will be placed. There may be a small, raised area where the port  is.  · The port will be flushed with a salt solution (saline), and blood will be drawn to make sure that it is working correctly.  · The incisions will be closed.  · Bandages (dressings) may be placed over the incisions.  The procedure may vary among health care providers and hospitals.  What happens after the procedure?  · Your blood pressure, heart rate, breathing rate, and blood oxygen level will be monitored until the medicines you were given have worn off.  · Do not drive for 24 hours if you were given a sedative.  · You will be given a manufacturer's information card for the type of port that you have. Keep this with you.  · Your port will need to be flushed and checked as told by your health care provider, usually every few weeks.  · A chest X-ray will be done to:  ? Check the placement of the port.  ? Make sure there is no injury to your lung.  Summary  · Implanted port insertion is a procedure to put in a port and catheter.  · The implanted port is used as a long-term IV access.  · The port will need to be flushed and checked as told by your health care provider, usually every few weeks.  · Keep your manufacturer's information card with you at all times.  This information is not intended to replace advice given to you by your health care provider. Make sure you discuss any questions you have with your health care provider.  Document Released: 01/25/2013 Document Revised: 02/26/2016 Document Reviewed: 02/26/2016  Elsevier Interactive Patient Education © 2017 [employer]

## 2018-03-11 ENCOUNTER — Encounter (HOSPITAL_COMMUNITY)
Admission: RE | Admit: 2018-03-11 | Discharge: 2018-03-11 | Disposition: A | Discharge status: Home / Self Care | Payer: 59 | Source: Ambulatory Visit | Attending: General Surgery | Admitting: General Surgery

## 2018-03-11 ENCOUNTER — Ambulatory Visit (HOSPITAL_COMMUNITY)
Admission: RE | Admit: 2018-03-11 | Discharge: 2018-03-11 | Disposition: A | Discharge status: Home / Self Care | Payer: 59 | Source: Ambulatory Visit | Attending: Hematology | Admitting: Hematology

## 2018-03-11 ENCOUNTER — Encounter (HOSPITAL_COMMUNITY): Payer: Self-pay

## 2018-03-11 DIAGNOSIS — C3431 Malignant neoplasm of lower lobe, right bronchus or lung: Secondary | ICD-10-CM | POA: Diagnosis present

## 2018-03-11 DIAGNOSIS — C3490 Malignant neoplasm of unspecified part of unspecified bronchus or lung: Secondary | ICD-10-CM | POA: Insufficient documentation

## 2018-03-11 LAB — GLUCOSE, CAPILLARY: GLUCOSE-CAPILLARY: 125 mg/dL — AB (ref 70–99)

## 2018-03-11 MED ORDER — FLUDEOXYGLUCOSE F - 18 (FDG) INJECTION
7.1900 | Freq: Once | INTRAVENOUS | Status: AC | PRN
  Administered 2018-03-11: 7.19 via INTRAVENOUS

## 2018-03-13 ENCOUNTER — Other Ambulatory Visit (HOSPITAL_COMMUNITY): Payer: Self-pay

## 2018-03-14 ENCOUNTER — Encounter (HOSPITAL_COMMUNITY)
Admission: RE | Disposition: A | Discharge status: Home / Self Care | Payer: Self-pay | Source: Ambulatory Visit | Attending: General Surgery

## 2018-03-14 ENCOUNTER — Ambulatory Visit (HOSPITAL_COMMUNITY): Payer: 59 | Admitting: Anesthesiology

## 2018-03-14 ENCOUNTER — Ambulatory Visit (HOSPITAL_COMMUNITY): Payer: 59

## 2018-03-14 ENCOUNTER — Other Ambulatory Visit: Payer: Self-pay

## 2018-03-14 ENCOUNTER — Encounter (HOSPITAL_COMMUNITY): Payer: Self-pay | Admitting: *Deleted

## 2018-03-14 ENCOUNTER — Ambulatory Visit (HOSPITAL_COMMUNITY)
Admission: RE | Admit: 2018-03-14 | Discharge: 2018-03-14 | Disposition: A | Discharge status: Home / Self Care | Payer: 59 | Source: Ambulatory Visit | Attending: General Surgery | Admitting: General Surgery

## 2018-03-14 DIAGNOSIS — F17210 Nicotine dependence, cigarettes, uncomplicated: Secondary | ICD-10-CM | POA: Diagnosis not present

## 2018-03-14 DIAGNOSIS — Z79899 Other long term (current) drug therapy: Secondary | ICD-10-CM | POA: Diagnosis not present

## 2018-03-14 DIAGNOSIS — Z881 Allergy status to other antibiotic agents status: Secondary | ICD-10-CM | POA: Diagnosis not present

## 2018-03-14 DIAGNOSIS — F329 Major depressive disorder, single episode, unspecified: Secondary | ICD-10-CM | POA: Diagnosis not present

## 2018-03-14 DIAGNOSIS — Z90710 Acquired absence of both cervix and uterus: Secondary | ICD-10-CM | POA: Diagnosis not present

## 2018-03-14 DIAGNOSIS — F419 Anxiety disorder, unspecified: Secondary | ICD-10-CM | POA: Insufficient documentation

## 2018-03-14 DIAGNOSIS — Z95828 Presence of other vascular implants and grafts: Secondary | ICD-10-CM

## 2018-03-14 DIAGNOSIS — Z885 Allergy status to narcotic agent status: Secondary | ICD-10-CM | POA: Diagnosis not present

## 2018-03-14 DIAGNOSIS — C3491 Malignant neoplasm of unspecified part of right bronchus or lung: Secondary | ICD-10-CM

## 2018-03-14 DIAGNOSIS — Z791 Long term (current) use of non-steroidal anti-inflammatories (NSAID): Secondary | ICD-10-CM | POA: Insufficient documentation

## 2018-03-14 HISTORY — PX: PORTACATH PLACEMENT: SHX2246

## 2018-03-14 SURGERY — INSERTION, TUNNELED CENTRAL VENOUS DEVICE, WITH PORT
Anesthesia: Monitor Anesthesia Care | Laterality: Left

## 2018-03-14 MED ORDER — HYDROCODONE-ACETAMINOPHEN 5-325 MG PO TABS: 1.0000 | ORAL_TABLET | Freq: Four times a day (QID) | ORAL | 0 refills | Status: DC | PRN

## 2018-03-14 MED ORDER — KETOROLAC TROMETHAMINE 30 MG/ML IJ SOLN: 30.0000 mg | Freq: Once | INTRAMUSCULAR | Status: DC

## 2018-03-14 MED ORDER — KETOROLAC TROMETHAMINE 30 MG/ML IJ SOLN
30.0000 mg | Freq: Once | INTRAMUSCULAR | Status: AC | PRN
  Administered 2018-03-14: 30 mg via INTRAVENOUS
  Filled 2018-03-14: qty 1

## 2018-03-14 MED ORDER — MIDAZOLAM HCL 5 MG/5ML IJ SOLN
INTRAMUSCULAR | Status: DC | PRN
  Administered 2018-03-14: 2 mg via INTRAVENOUS

## 2018-03-14 MED ORDER — HYDROMORPHONE HCL 1 MG/ML IJ SOLN: 0.2500 mg | INTRAMUSCULAR | Status: DC | PRN

## 2018-03-14 MED ORDER — PROPOFOL 10 MG/ML IV BOLUS
INTRAVENOUS | Status: AC
  Filled 2018-03-14: qty 40

## 2018-03-14 MED ORDER — CEFAZOLIN SODIUM-DEXTROSE 2-4 GM/100ML-% IV SOLN
2.0000 g | INTRAVENOUS | Status: AC
  Administered 2018-03-14: 2 g via INTRAVENOUS
  Filled 2018-03-14: qty 100

## 2018-03-14 MED ORDER — KETOROLAC TROMETHAMINE 30 MG/ML IJ SOLN
INTRAMUSCULAR | Status: AC
  Filled 2018-03-14: qty 1

## 2018-03-14 MED ORDER — LACTATED RINGERS IV SOLN
INTRAVENOUS | Status: DC
  Administered 2018-03-14: 1000 mL via INTRAVENOUS

## 2018-03-14 MED ORDER — HEPARIN SOD (PORK) LOCK FLUSH 100 UNIT/ML IV SOLN
INTRAVENOUS | Status: AC
  Filled 2018-03-14: qty 5

## 2018-03-14 MED ORDER — ONDANSETRON HCL 4 MG/2ML IJ SOLN
INTRAMUSCULAR | Status: AC
  Filled 2018-03-14: qty 2

## 2018-03-14 MED ORDER — ROCURONIUM BROMIDE 10 MG/ML (PF) SYRINGE
PREFILLED_SYRINGE | INTRAVENOUS | Status: AC
  Filled 2018-03-14: qty 10

## 2018-03-14 MED ORDER — PROPOFOL 10 MG/ML IV BOLUS
INTRAVENOUS | Status: DC | PRN
  Administered 2018-03-14: 40 mg via INTRAVENOUS
  Administered 2018-03-14: 20 mg via INTRAVENOUS

## 2018-03-14 MED ORDER — ONDANSETRON HCL 4 MG/2ML IJ SOLN
INTRAMUSCULAR | Status: DC | PRN
  Administered 2018-03-14: 4 mg via INTRAVENOUS

## 2018-03-14 MED ORDER — HYDROCODONE-ACETAMINOPHEN 5-325 MG PO TABS: 1.0000 | ORAL_TABLET | ORAL | 0 refills | Status: DC | PRN

## 2018-03-14 MED ORDER — HEPARIN SOD (PORK) LOCK FLUSH 100 UNIT/ML IV SOLN
INTRAVENOUS | Status: DC | PRN
  Administered 2018-03-14: 500 [IU] via INTRAVENOUS

## 2018-03-14 MED ORDER — ONDANSETRON HCL 4 MG/2ML IJ SOLN: 4.0000 mg | Freq: Once | INTRAMUSCULAR | Status: DC | PRN

## 2018-03-14 MED ORDER — BUPIVACAINE IN DEXTROSE 0.75-8.25 % IT SOLN
INTRATHECAL | Status: AC
  Filled 2018-03-14: qty 2

## 2018-03-14 MED ORDER — GLYCOPYRROLATE PF 0.2 MG/ML IJ SOSY
PREFILLED_SYRINGE | INTRAMUSCULAR | Status: AC
  Filled 2018-03-14: qty 2

## 2018-03-14 MED ORDER — LIDOCAINE 2% (20 MG/ML) 5 ML SYRINGE
INTRAMUSCULAR | Status: AC
  Filled 2018-03-14: qty 15

## 2018-03-14 MED ORDER — LIDOCAINE HCL (PF) 1 % IJ SOLN
INTRAMUSCULAR | Status: AC
  Filled 2018-03-14: qty 30

## 2018-03-14 MED ORDER — CHLORHEXIDINE GLUCONATE CLOTH 2 % EX PADS: 6.0000 | MEDICATED_PAD | Freq: Once | CUTANEOUS | Status: DC

## 2018-03-14 MED ORDER — ARTIFICIAL TEARS OPHTHALMIC OINT
TOPICAL_OINTMENT | OPHTHALMIC | Status: AC
  Filled 2018-03-14: qty 3.5

## 2018-03-14 MED ORDER — SODIUM CHLORIDE (PF) 0.9 % IJ SOLN
INTRAMUSCULAR | Status: DC | PRN
  Administered 2018-03-14: 50 mL via INTRAVENOUS

## 2018-03-14 MED ORDER — MEPERIDINE HCL 50 MG/ML IJ SOLN: 6.2500 mg | INTRAMUSCULAR | Status: DC | PRN

## 2018-03-14 MED ORDER — HYDROCODONE-ACETAMINOPHEN 7.5-325 MG PO TABS: 1.0000 | ORAL_TABLET | Freq: Once | ORAL | Status: DC | PRN

## 2018-03-14 MED ORDER — FENTANYL CITRATE (PF) 100 MCG/2ML IJ SOLN
INTRAMUSCULAR | Status: AC
  Filled 2018-03-14: qty 2

## 2018-03-14 MED ORDER — MIDAZOLAM HCL 2 MG/2ML IJ SOLN
INTRAMUSCULAR | Status: AC
  Filled 2018-03-14: qty 2

## 2018-03-14 MED ORDER — SUGAMMADEX SODIUM 500 MG/5ML IV SOLN
INTRAVENOUS | Status: AC
  Filled 2018-03-14: qty 5

## 2018-03-14 MED ORDER — PROPOFOL 500 MG/50ML IV EMUL
INTRAVENOUS | Status: DC | PRN
  Administered 2018-03-14: 150 ug/kg/min via INTRAVENOUS

## 2018-03-14 MED ORDER — LIDOCAINE HCL (PF) 1 % IJ SOLN
INTRAMUSCULAR | Status: DC | PRN
  Administered 2018-03-14: 5 mL

## 2018-03-14 MED ORDER — FENTANYL CITRATE (PF) 100 MCG/2ML IJ SOLN
INTRAMUSCULAR | Status: DC | PRN
  Administered 2018-03-14: 50 ug via INTRAVENOUS

## 2018-03-14 SURGICAL SUPPLY — 31 items
BAG DECANTER FOR FLEXI CONT (MISCELLANEOUS) ×2 IMPLANT
CHLORAPREP W/TINT 10.5 ML (MISCELLANEOUS) ×2 IMPLANT
CLOTH BEACON ORANGE TIMEOUT ST (SAFETY) ×2 IMPLANT
COVER LIGHT HANDLE STERIS (MISCELLANEOUS) ×4 IMPLANT
DECANTER SPIKE VIAL GLASS SM (MISCELLANEOUS) ×2 IMPLANT
DERMABOND ADVANCED (GAUZE/BANDAGES/DRESSINGS) ×1
DERMABOND ADVANCED .7 DNX12 (GAUZE/BANDAGES/DRESSINGS) ×1 IMPLANT
DRAPE C-ARM FOLDED MOBILE STRL (DRAPES) ×2 IMPLANT
ELECT REM PT RETURN 9FT ADLT (ELECTROSURGICAL) ×2
ELECTRODE REM PT RTRN 9FT ADLT (ELECTROSURGICAL) ×1 IMPLANT
GLOVE BIOGEL PI IND STRL 7.0 (GLOVE) ×3 IMPLANT
GLOVE BIOGEL PI IND STRL 7.5 (GLOVE) ×1 IMPLANT
GLOVE BIOGEL PI INDICATOR 7.0 (GLOVE) ×3
GLOVE BIOGEL PI INDICATOR 7.5 (GLOVE) ×1
GLOVE ECLIPSE 7.5 STRL STRAW (GLOVE) ×2 IMPLANT
GLOVE SURG SS PI 7.5 STRL IVOR (GLOVE) ×2 IMPLANT
GOWN STRL REUS W/TWL LRG LVL3 (GOWN DISPOSABLE) ×4 IMPLANT
IV NS 500ML (IV SOLUTION) ×1
IV NS 500ML BAXH (IV SOLUTION) ×1 IMPLANT
KIT PORT POWER 8FR ISP MRI (Port) ×2 IMPLANT
KIT TURNOVER KIT A (KITS) ×2 IMPLANT
NEEDLE HYPO 25X1 1.5 SAFETY (NEEDLE) ×2 IMPLANT
PACK MINOR (CUSTOM PROCEDURE TRAY) ×2 IMPLANT
PAD ARMBOARD 7.5X6 YLW CONV (MISCELLANEOUS) ×2 IMPLANT
SET BASIN LINEN APH (SET/KITS/TRAYS/PACK) ×2 IMPLANT
SUT MNCRL AB 4-0 PS2 18 (SUTURE) ×2 IMPLANT
SUT VIC AB 3-0 SH 27 (SUTURE) ×1
SUT VIC AB 3-0 SH 27X BRD (SUTURE) ×1 IMPLANT
SYR 20CC LL (SYRINGE) ×2 IMPLANT
SYR 5ML LL (SYRINGE) ×2 IMPLANT
SYR CONTROL 10ML LL (SYRINGE) ×2 IMPLANT

## 2018-03-14 NOTE — Anesthesia Preprocedure Evaluation (Signed)
Anesthesia Evaluation  Patient identified by MRN, date of birth, ID band Patient awake    Reviewed: Allergy & Precautions, H&P , NPO status , Patient's Chart, lab work & pertinent test results  Airway Mallampati: II  TM Distance: >3 FB Neck ROM: full    Dental no notable dental hx.    Pulmonary neg pulmonary ROS, Current Smoker,    Pulmonary exam normal breath sounds clear to auscultation       Cardiovascular Exercise Tolerance: Good negative cardio ROS   Rhythm:regular Rate:Normal     Neuro/Psych  Headaches, Seizures -,  PSYCHIATRIC DISORDERS Anxiety Depression    GI/Hepatic negative GI ROS, Neg liver ROS,   Endo/Other  negative endocrine ROS  Renal/GU negative Renal ROS  negative genitourinary   Musculoskeletal   Abdominal   Peds  Hematology negative hematology ROS (+)   Anesthesia Other Findings   Reproductive/Obstetrics negative OB ROS                             Anesthesia Physical Anesthesia Plan  ASA: III  Anesthesia Plan: MAC   Post-op Pain Management:    Induction:   PONV Risk Score and Plan:   Airway Management Planned:   Additional Equipment:   Intra-op Plan:   Post-operative Plan:   Informed Consent: I have reviewed the patients History and Physical, chart, labs and discussed the procedure including the risks, benefits and alternatives for the proposed anesthesia with the patient or authorized representative who has indicated his/her understanding and acceptance.   Dental Advisory Given  Plan Discussed with: CRNA  Anesthesia Plan Comments:         Anesthesia Quick Evaluation

## 2018-03-14 NOTE — OR Nursing (Signed)
Portable CXR done. No pneumo per Dr Arnoldo Morale.

## 2018-03-14 NOTE — Op Note (Signed)
Patient:  Sharon Berg  DOB:  May 30, 1965  MRN:  [id number]   Preop Diagnosis: Right lung carcinoma, need for central venous access  Postop Diagnosis: Same  Procedure: Port-A-Cath insertion  Surgeon: Aviva Signs, MD  Anes: MAC  Indications: Patient is a 52 year old white female was about to undergo chemotherapy for right lung carcinoma.  The risks and benefits of the procedure including bleeding, infection, and pneumothorax were fully explained to the patient, who gave informed consent.  Procedure note: The patient was placed in supine position.  After monitored anesthesia care was given, the left upper chest was prepped and draped using the usual sterile technique with DuraPrep.  Surgical site confirmation was performed.  1% Xylocaine was used for local anesthesia.  Incision was made below the left clavicle.  Subcutaneous pocket was formed.  A needle was advanced into the left subclavian vein using the Seldinger technique without difficulty.  A guidewire was then advanced into the right atrium under fluoroscopic guidance.  An introducer and peel-away sheath were placed over the guidewire.  The catheter was then inserted through the peel-away sheath and the peel-away sheath was removed.  The catheter was then attached to the port and the port placed in subcutaneous pocket.  Adequate positioning was confirmed by fluoroscopy.  Good backflow blood was noted on aspiration of the port.  The port was flushed with heparin flush.  Subcutaneous layer was reapproximated using a 3-0 Vicryl interrupted suture.  The skin was closed using a 4-0 Monocryl subcuticular suture.  Dermabond was applied.  All tape and needle counts were correct at the end of the procedure.  The patient was awakened and transferred to PACU in stable condition.  A chest x-ray will be performed at that time.  Complications: None  EBL: Minimal  Specimen: None

## 2018-03-14 NOTE — Discharge Instructions (Signed)
Implanted Port Home Guide °An implanted port is a type of central line that is placed under the skin. Central lines are used to provide IV access when treatment or nutrition needs to be given through a person’s veins. Implanted ports are used for long-term IV access. An implanted port may be placed because: °· You need IV medicine that would be irritating to the small veins in your hands or arms. °· You need long-term IV medicines, such as antibiotics. °· You need IV nutrition for a long period. °· You need frequent blood draws for lab tests. °· You need dialysis. ° °Implanted ports are usually placed in the chest area, but they can also be placed in the upper arm, the abdomen, or the leg. An implanted port has two main parts: °· Reservoir. The reservoir is round and will appear as a small, raised area under your skin. The reservoir is the part where a needle is inserted to give medicines or draw blood. °· Catheter. The catheter is a thin, flexible tube that extends from the reservoir. The catheter is placed into a large vein. Medicine that is inserted into the reservoir goes into the catheter and then into the vein. ° °How will I care for my incision site? °Do not get the incision site wet. Bathe or shower as directed by your health care provider. °How is my port accessed? °Special steps must be taken to access the port: °· Before the port is accessed, a numbing cream can be placed on the skin. This helps numb the skin over the port site. °· Your health care provider uses a sterile technique to access the port. °? Your health care provider must put on a mask and sterile gloves. °? The skin over your port is cleaned carefully with an antiseptic and allowed to dry. °? The port is gently pinched between sterile gloves, and a needle is inserted into the port. °· Only "non-coring" port needles should be used to access the port. Once the port is accessed, a blood return should be checked. This helps ensure that the port  is in the vein and is not clogged. °· If your port needs to remain accessed for a constant infusion, a clear (transparent) bandage will be placed over the needle site. The bandage and needle will need to be changed every week, or as directed by your health care provider. °· Keep the bandage covering the needle clean and dry. Do not get it wet. Follow your health care provider’s instructions on how to take a shower or bath while the port is accessed. °· If your port does not need to stay accessed, no bandage is needed over the port. ° °What is flushing? °Flushing helps keep the port from getting clogged. Follow your health care provider’s instructions on how and when to flush the port. Ports are usually flushed with saline solution or a medicine called heparin. The need for flushing will depend on how the port is used. °· If the port is used for intermittent medicines or blood draws, the port will need to be flushed: °? After medicines have been given. °? After blood has been drawn. °? As part of routine maintenance. °· If a constant infusion is running, the port may not need to be flushed. ° °How long will my port stay implanted? °The port can stay in for as long as your health care provider thinks it is needed. When it is time for the port to come out, surgery will be   done to remove it. The procedure is similar to the one performed when the port was put in. °When should I seek immediate medical care? °When you have an implanted port, you should seek immediate medical care if: °· You notice a bad smell coming from the incision site. °· You have swelling, redness, or drainage at the incision site. °· You have more swelling or pain at the port site or the surrounding area. °· You have a fever that is not controlled with medicine. ° °This information is not intended to replace advice given to you by your health care provider. Make sure you discuss any questions you have with your health care provider. °Document  Released: 04/06/2005 Document Revised: 09/12/2015 Document Reviewed: 12/12/2012 °Elsevier Interactive Patient Education © 2017 [employer] °Implanted Port Insertion, Care After °This sheet gives you information about how to care for yourself after your procedure. Your health care provider may also give you more specific instructions. If you have problems or questions, contact your health care provider. °What can I expect after the procedure? °After your procedure, it is common to have: °· Discomfort at the port insertion site. °· Bruising on the skin over the port. This should improve over 3-4 days. ° °Follow these instructions at home: °Port care °· After your port is placed, you will get a manufacturer's information card. The card has information about your port. Keep this card with you at all times. °· Take care of the port as told by your health care provider. Ask your health care provider if you or a family member can get training for taking care of the port at home. A home health care nurse may also take care of the port. °· Make sure to remember what type of port you have. °Incision care °· Follow instructions from your health care provider about how to take care of your port insertion site. Make sure you: °? Wash your hands with soap and water before you change your bandage (dressing). If soap and water are not available, use hand sanitizer. °? Change your dressing as told by your health care provider. °? Leave stitches (sutures), skin glue, or adhesive strips in place. These skin closures may need to stay in place for 2 weeks or longer. If adhesive strip edges start to loosen and curl up, you may trim the loose edges. Do not remove adhesive strips completely unless your health care provider tells you to do that. °· Check your port insertion site every day for signs of infection. Check for: °? More redness, swelling, or pain. °? More fluid or blood. °? Warmth. °? Pus or a bad smell. °General  instructions °· Do not take baths, swim, or use a hot tub until your health care provider approves. °· Do not lift anything that is heavier than 10 lb (4.5 kg) for a week, or as told by your health care provider. °· Ask your health care provider when it is okay to: °? Return to work or school. °? Resume usual physical activities or sports. °· Do not drive for 24 hours if you were given a medicine to help you relax (sedative). °· Take over-the-counter and prescription medicines only as told by your health care provider. °· Wear a medical alert bracelet in case of an emergency. This will tell any health care providers that you have a port. °· Keep all follow-up visits as told by your health care provider. This is important. °Contact a health care provider if: °· You cannot   flush your port with saline as directed, or you cannot draw blood from the port. °· You have a fever or chills. °· You have more redness, swelling, or pain around your port insertion site. °· You have more fluid or blood coming from your port insertion site. °· Your port insertion site feels warm to the touch. °· You have pus or a bad smell coming from the port insertion site. °Get help right away if: °· You have chest pain or shortness of breath. °· You have bleeding from your port that you cannot control. °Summary °· Take care of the port as told by your health care provider. °· Change your dressing as told by your health care provider. °· Keep all follow-up visits as told by your health care provider. °This information is not intended to replace advice given to you by your health care provider. Make sure you discuss any questions you have with your health care provider. °Document Released: 01/25/2013 Document Revised: 02/26/2016 Document Reviewed: 02/26/2016 °Elsevier Interactive Patient Education © 2017 [employer] ° °

## 2018-03-14 NOTE — Anesthesia Procedure Notes (Signed)
Procedure Name: MAC Date/Time: 03/14/2018 2:23 PM Performed by: Vista Deck, CRNA Pre-anesthesia Checklist: Patient identified, Emergency Drugs available, Suction available, Timeout performed and Patient being monitored Patient Re-evaluated:Patient Re-evaluated prior to induction Oxygen Delivery Method: Nasal Cannula

## 2018-03-14 NOTE — Anesthesia Postprocedure Evaluation (Signed)
Anesthesia Post Note  Patient: Sharon Berg  Procedure(s) Performed: INSERTION PORT-A-CATH (Left )  Patient location during evaluation: Short Stay Anesthesia Type: MAC Level of consciousness: awake and alert and patient cooperative Pain management: pain level controlled Vital Signs Assessment: post-procedure vital signs reviewed and stable Respiratory status: spontaneous breathing Cardiovascular status: stable Postop Assessment: no apparent nausea or vomiting Anesthetic complications: no     Last Vitals:  Vitals:   03/14/18 1309  BP: 105/67  Pulse: 76  Resp: 20  Temp: 36.6 C  SpO2: 92%    Last Pain:  Vitals:   03/14/18 1309  TempSrc: Oral  PainSc: 6                  [name]

## 2018-03-14 NOTE — Interval H&P Note (Signed)
History and Physical Interval Note:  03/14/2018 1:21 PM  Sharon Berg  has presented today for surgery, with the diagnosis of right lung cancer  The various methods of treatment have been discussed with the patient and family. After consideration of risks, benefits and other options for treatment, the patient has consented to  Procedure(s): INSERTION PORT-A-CATH (Left) as a surgical intervention .  The patient's history has been reviewed, patient examined, no change in status, stable for surgery.  I have reviewed the patient's chart and labs.  Questions were answered to the patient's satisfaction.     Aviva Signs

## 2018-03-14 NOTE — Transfer of Care (Signed)
Immediate Anesthesia Transfer of Care Note  Patient: Sharon Berg  Procedure(s) Performed: INSERTION PORT-A-CATH (Left )  Patient Location: PACU  Anesthesia Type:MAC  Level of Consciousness: awake, alert  and patient cooperative  Airway & Oxygen Therapy: Patient Spontanous Breathing and Patient connected to nasal cannula oxygen  Post-op Assessment: Report given to RN and Post -op Vital signs reviewed and stable  Post vital signs: Reviewed and stable  Last Vitals:  Vitals Value Taken Time  BP    Temp    Pulse 64 03/14/2018  3:01 PM  Resp    SpO2 95 % 03/14/2018  3:01 PM  Vitals shown include unvalidated device data.  Last Pain:  Vitals:   03/14/18 1309  TempSrc: Oral  PainSc: 6       Patients Stated Pain Goal: 6 (76/14/70 9295)  Complications: No apparent anesthesia complications

## 2018-03-15 ENCOUNTER — Other Ambulatory Visit: Payer: Self-pay | Admitting: General Surgery

## 2018-03-15 ENCOUNTER — Ambulatory Visit: Payer: 59 | Admitting: General Surgery

## 2018-03-15 ENCOUNTER — Institutional Professional Consult (permissible substitution): Payer: 59 | Admitting: Thoracic Surgery (Cardiothoracic Vascular Surgery)

## 2018-03-15 ENCOUNTER — Other Ambulatory Visit: Payer: Self-pay | Admitting: *Deleted

## 2018-03-15 ENCOUNTER — Encounter (HOSPITAL_COMMUNITY): Payer: Self-pay | Admitting: General Surgery

## 2018-03-15 ENCOUNTER — Other Ambulatory Visit: Payer: Self-pay

## 2018-03-15 VITALS — BP 100/70 | HR 66 | Resp 18 | Ht 64.5 in | Wt 142.0 lb

## 2018-03-15 DIAGNOSIS — C3491 Malignant neoplasm of unspecified part of right bronchus or lung: Secondary | ICD-10-CM

## 2018-03-15 DIAGNOSIS — R591 Generalized enlarged lymph nodes: Secondary | ICD-10-CM

## 2018-03-15 DIAGNOSIS — R599 Enlarged lymph nodes, unspecified: Secondary | ICD-10-CM

## 2018-03-15 DIAGNOSIS — R918 Other nonspecific abnormal finding of lung field: Secondary | ICD-10-CM

## 2018-03-15 MED ORDER — HYDROCODONE-ACETAMINOPHEN 5-325 MG PO TABS: 1.0000 | ORAL_TABLET | ORAL | 0 refills | Status: DC | PRN

## 2018-03-15 NOTE — Progress Notes (Addendum)
PCP is [name], MD Referring Provider is Derek [name], MD  Chief Complaint  Patient presents with  . Lung Lesion    new patient consultation, PET 03/11/18, Chest CT 02/23/18    HPI: Sharon Berg is a 52 year old woman who was sent for consultation regarding a right hilar mass with mediastinal adenopathy.  She has a history of tobacco abuse smoking 1.5 packs/day since age 31.  She says she has been trying to quit but has been unable to.  She also has a history of anxiety, depression, seizures, and migraine headaches.  She has been feeling poorly for the past several months.  She has had a poor appetite and has lost about 7 pounds in 3 months.  She presented to the emergency room on 02/23/2018 with chest pain radiating to her jaw.  Her EKG was unremarkable.  A CT of the chest showed a central right hilar mass with hilar and mediastinal adenopathy.  Referred to Dr. Delton Coombes.  A PET/CT showed the mass was hypermetabolic.  Dr. Arnoldo Morale placed a Port-A-Cath yesterday.  Zubrod Score: At the time of surgery this patient's most appropriate activity status/level should be described as: []     0    Normal activity, no symptoms [x]     1    Restricted in physical strenuous activity but ambulatory, able to do out light work []     2    Ambulatory and capable of self care, unable to do work activities, up and about >50 % of waking hours                              []     3    Only limited self care, in bed greater than 50% of waking hours []     4    Completely disabled, no self care, confined to bed or chair []     5    Moribund  Past Medical History:  Diagnosis Date  . Anxiety   . Depression   . Lung cancer ([place])   . Migraine   . 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 ABDOMINAL HYSTERECTOMY  2000  . PORTACATH PLACEMENT Left 03/14/2018   Procedure: INSERTION PORT-A-CATH;  Surgeon: Aviva Signs, MD;  Location: [facility];  Service:  General;  Laterality: Left;    Family History  Problem Relation Age of Onset  . Hypertension Mother   . Diabetes Father   . Heart disease Maternal Grandmother   . Stroke Maternal Grandmother   . Heart disease Maternal Grandfather   . Dementia Paternal Grandmother   . Dementia Paternal Grandfather     Social History Social History   Tobacco Use  . Smoking status: Current Every Day Smoker    Packs/day: 1.50    Years: 38.00    Pack years: 57.00    Types: Cigarettes  . Smokeless tobacco: Never Used  Substance Use Topics  . Alcohol use: No    Alcohol/week: 0.0 standard drinks  . Drug use: No    Current Outpatient Medications  Medication Sig Dispense Refill  . ALPRAZolam (XANAX) 1 MG tablet TAKE 1/2 TO 1 TABLET TWICE A DAY FOR ANXIETY (Patient taking differently: Take 1 mg by mouth 2 (two) times daily. TAKE 1/2 TO 1 TABLET TWICE A DAY FOR ANXIETY) 60 tablet 5  . HYDROcodone-acetaminophen (NORCO/VICODIN) 5-325 MG tablet Take 1 tablet by mouth every  4 (four) hours as needed for moderate pain. 30 tablet 0  . nicotine (NICODERM CQ - DOSED IN MG/24 HOURS) 21 mg/24hr patch Place 1 patch (21 mg total) onto the skin daily. 28 patch 1  . sertraline (ZOLOFT) 100 MG tablet TAKE 1 AND 1/2 TABLETS ONCE DAILY (Patient taking differently: Take 150 mg by mouth daily. ) 45 tablet 5  . traZODone (DESYREL) 100 MG tablet Take 3 tablets (300 mg total) by mouth at bedtime. 90 tablet 5   No current facility-administered medications for this visit.     Allergies  Allergen Reactions  . Levaquin [Levofloxacin] Other (See Comments)    Myalgia  . Codeine Nausea And Vomiting    Review of Systems  Constitutional: Positive for activity change, appetite change and unexpected weight change (lost 7 pounds in 3 months).  HENT: Negative for trouble swallowing and voice change.   Eyes: Negative for visual disturbance.  Respiratory: Negative for cough, shortness of breath and wheezing.   Cardiovascular:  Positive for chest pain.  Gastrointestinal: Negative for abdominal distention and abdominal pain.  Genitourinary: Negative for difficulty urinating and dysuria.  Musculoskeletal: Negative for arthralgias and joint swelling.  Neurological: Positive for seizures and headaches. Negative for syncope and weakness.  Hematological: Negative for adenopathy. Does not bruise/bleed easily.  Psychiatric/Behavioral: The patient is nervous/anxious.   All other systems reviewed and are negative.   BP 100/70 (BP Location: Right Arm, Patient Position: Sitting, Cuff Size: Normal)   Pulse 66   Resp 18   Ht 5' 4.5" (1.638 m)   Wt 142 lb (64.4 kg)   SpO2 93% Comment: RA  BMI 24.00 kg/m  Physical Exam  Constitutional: She is oriented to person, place, and time. She appears well-developed and well-nourished.  No acute distress but obviously uncomfortable from Port-A-Cath placement  HENT:  Head: Normocephalic and atraumatic.  Eyes: Pupils are equal, round, and reactive to light. Conjunctivae and EOM are normal. No scleral icterus.  Neck: No thyromegaly present.  Cardiovascular: Normal rate, regular rhythm and normal heart sounds.  No murmur heard. Pulmonary/Chest: Effort normal and breath sounds normal. No respiratory distress. She has no wheezes. She has no rales.  Port-A-Cath in place left chest, incision healing well  Abdominal: Soft. She exhibits no distension. There is no tenderness.  Lymphadenopathy:    She has no cervical adenopathy.  Neurological: She is alert and oriented to person, place, and time. No cranial nerve deficit. She exhibits normal muscle tone. Coordination normal.  Skin: Skin is warm and dry.  Vitals reviewed.  Diagnostic Tests: CT CHEST WITH CONTRAST  TECHNIQUE: Multidetector CT imaging of the chest was performed during intravenous contrast administration.  CONTRAST:  59mL OMNIPAQUE IOHEXOL 300 MG/ML  SOLN  COMPARISON:  Chest radiograph of earlier today.  No prior  CT.  FINDINGS: Cardiovascular: Aortic atherosclerosis. Normal heart size, without pericardial effusion. No central pulmonary embolism, on this non-dedicated study.  Mediastinum/Nodes: No supraclavicular adenopathy. Partially necrotic posterior right mediastinal nodal mass of 3.2 x 2.9 cm on image 51/2. This is intimately associated with the T4 vertebral body, without gross osseous involvement.  Right hilar adenopathy at 2.0 cm on image 67/2. Precarinal necrotic node of 9 mm on image 58/2.  Lungs/Pleura: No pleural fluid. Mass-effect upon right upper lobe bronchus with compression or involvement of the posterior segmental bronchus, including on image 59/4.  Moderate bullous type emphysema with centrilobular and paraseptal components.  Favor pulmonary parenchymal primary lung lesion involving the central superior segment right lower lobe.  Example 2.8 x 2.4 cm on image 61/4. More patchy central right upper lobe consolidation including on image 53/4.  Clear left lung.  Upper Abdomen: Normal imaged portions of the liver, spleen, stomach, pancreas, gallbladder, adrenal glands, kidneys.  Musculoskeletal: Moderate thoracic spondylosis. Mild T11 compression deformity without vertebral canal encroachment.  IMPRESSION: 1. Necrotic adenopathy within the mediastinum and right hilum, likely representing metastatic primary bronchogenic carcinoma. A central superior segment right lower lobe nodule likely represents the primary. This could alternatively represent another focus of nodal metastasis. 2. Right upper lobe central consolidation is favored to represent postobstructive pneumonitis. 3. The dominant posterior mediastinal lesion is intimately associated with the fourth right rib, without gross osseous destruction. 4. Aortic atherosclerosis (ICD10-I70.0) and emphysema (ICD10-J43.9).  Recommend multidisciplinary referral for PET and/or tissue  sampling.   Electronically Signed   By: Abigail Miyamoto M.D.   On: 02/23/2018 19:38 NUCLEAR MEDICINE PET SKULL BASE TO THIGH  TECHNIQUE: 7.2 mCi F-18 FDG was injected intravenously. Full-ring PET imaging was performed from the skull base to thigh after the radiotracer. CT data was obtained and used for attenuation correction and anatomic localization.  Fasting blood glucose: 125 mg/dl  COMPARISON:  Chest CT 02/23/2018.  FINDINGS: Mediastinal blood pool activity: SUV max 1.7  NECK: Laryngeal activity is likely physiologic/due to phonation after radiopharmaceutical injection. No cervical nodal hypermetabolism.  Incidental CT findings: No cervical adenopathy.  CHEST: Hypermetabolism corresponding to previously described posterior right mediastinal confluent necrotic adenopathy. This is contiguous today with the soft tissue density within or adjacent the superior segment right lower lobe described on the prior CT. In total, this soft tissue density measures on the order of 4.4 x 4.1 cm and a S.U.V. max of 26.3, including on image 70/4. This is again intimately associated with the anterior aspect of the T4 vertebral body, without gross osseous destruction. Example image 65/4.  Adjacent subcarinal node of 9 mm is also hypermetabolic.  Incidental CT findings: Aortic atherosclerosis. Redemonstration of presumed postobstructive pneumonitis within the right upper lobe and right apex. Moderate bullous type emphysema. A left lower lobe pulmonary nodule of 5 mm on image 100/4 is below PET resolution.  ABDOMEN/PELVIS: No abdominopelvic parenchymal or nodal hypermetabolism.  Incidental CT findings: Normal adrenal glands. Abdominal aortic and branch vessel atherosclerosis. Hysterectomy.  SKELETON: No abnormal marrow activity.  Incidental CT findings: Probable bone islands within the pelvis.  IMPRESSION: 1. Hypermetabolism, corresponding to the right sided  mediastinal and suprahilar nodal mass. The mass is again intimately associated with the anterior aspect of the T4 vertebral body, without gross osseous destruction. 2. An adjacent/contiguous hypermetabolic subcarinal node is consistent with metastasis. 3. No separate pulmonary parenchymal primary identified. No evidence of extrathoracic hypermetabolic metastasis. 4. Aortic atherosclerosis (ICD10-I70.0) and emphysema (ICD10-J43.9).   Electronically Signed   By: Abigail Miyamoto M.D.   On: 03/11/2018 11:17 I personally reviewed the CT and PET/CT images and concur with the findings noted above  Impression: Sharon Berg is a 52 year old smoker with a right hilar mass with hilar and mediastinal adenopathy.  This almost certainly is a new primary bronchogenic carcinoma.  She needs a tissue diagnosis in order to initiate treatment.  The main differential is small cell versus non-small cell.  We will try to obtain enough tissue for molecular testing should this turn out to be non-small cell carcinoma.  I recommended that we proceed with bronchoscopy and endobronchial ultrasound for diagnostic purposes.  I discussed the general nature of the procedure with Sharon Berg and her  mother.  We we will plan to do this under general anesthesia.  We will plan to do it on an outpatient basis.  I informed him of the indications were, risk, benefits, and alternatives.  They understand the risks include those associated with general anesthesia, such as MI, DVT, PE, stroke, as well as procedural specific risks such as bleeding, pneumothorax, failure to make a diagnosis, as well as the possibility of other unforeseeable complications.  She accepts the risks and wishes to proceed.  Plan:  Bronchoscopy and endobronchial ultrasound on Monday, 03/21/2018  Melrose Nakayama, MD [facility] [phone]

## 2018-03-15 NOTE — H&P (View-Only) (Signed)
PCP is [name], MD Referring Provider is Derek [name], MD  Chief Complaint  Patient presents with  . Lung Lesion    new patient consultation, PET 03/11/18, Chest CT 02/23/18    HPI: Sharon Berg is a 52 year old woman who was sent for consultation regarding a right hilar mass with mediastinal adenopathy.  She has a history of tobacco abuse smoking 1.5 packs/day since age 37.  She says she has been trying to quit but has been unable to.  She also has a history of anxiety, depression, seizures, and migraine headaches.  She has been feeling poorly for the past several months.  She has had a poor appetite and has lost about 7 pounds in 3 months.  She presented to the emergency room on 02/23/2018 with chest pain radiating to her jaw.  Her EKG was unremarkable.  A CT of the chest showed a central right hilar mass with hilar and mediastinal adenopathy.  Referred to Dr. Delton Coombes.  A PET/CT showed the mass was hypermetabolic.  Dr. Arnoldo Morale placed a Port-A-Cath yesterday.  Zubrod Score: At the time of surgery this patient's most appropriate activity status/level should be described as: []     0    Normal activity, no symptoms [x]     1    Restricted in physical strenuous activity but ambulatory, able to do out light work []     2    Ambulatory and capable of self care, unable to do work activities, up and about >50 % of waking hours                              []     3    Only limited self care, in bed greater than 50% of waking hours []     4    Completely disabled, no self care, confined to bed or chair []     5    Moribund  Past Medical History:  Diagnosis Date  . Anxiety   . Depression   . Lung cancer ([place])   . Migraine   . 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 ABDOMINAL HYSTERECTOMY  2000  . PORTACATH PLACEMENT Left 03/14/2018   Procedure: INSERTION PORT-A-CATH;  Surgeon: Aviva Signs, MD;  Location: [facility];  Service:  General;  Laterality: Left;    Family History  Problem Relation Age of Onset  . Hypertension Mother   . Diabetes Father   . Heart disease Maternal Grandmother   . Stroke Maternal Grandmother   . Heart disease Maternal Grandfather   . Dementia Paternal Grandmother   . Dementia Paternal Grandfather     Social History Social History   Tobacco Use  . Smoking status: Current Every Day Smoker    Packs/day: 1.50    Years: 38.00    Pack years: 57.00    Types: Cigarettes  . Smokeless tobacco: Never Used  Substance Use Topics  . Alcohol use: No    Alcohol/week: 0.0 standard drinks  . Drug use: No    Current Outpatient Medications  Medication Sig Dispense Refill  . ALPRAZolam (XANAX) 1 MG tablet TAKE 1/2 TO 1 TABLET TWICE A DAY FOR ANXIETY (Patient taking differently: Take 1 mg by mouth 2 (two) times daily. TAKE 1/2 TO 1 TABLET TWICE A DAY FOR ANXIETY) 60 tablet 5  . HYDROcodone-acetaminophen (NORCO/VICODIN) 5-325 MG tablet Take 1 tablet by mouth every  4 (four) hours as needed for moderate pain. 30 tablet 0  . nicotine (NICODERM CQ - DOSED IN MG/24 HOURS) 21 mg/24hr patch Place 1 patch (21 mg total) onto the skin daily. 28 patch 1  . sertraline (ZOLOFT) 100 MG tablet TAKE 1 AND 1/2 TABLETS ONCE DAILY (Patient taking differently: Take 150 mg by mouth daily. ) 45 tablet 5  . traZODone (DESYREL) 100 MG tablet Take 3 tablets (300 mg total) by mouth at bedtime. 90 tablet 5   No current facility-administered medications for this visit.     Allergies  Allergen Reactions  . Levaquin [Levofloxacin] Other (See Comments)    Myalgia  . Codeine Nausea And Vomiting    Review of Systems  Constitutional: Positive for activity change, appetite change and unexpected weight change (lost 7 pounds in 3 months).  HENT: Negative for trouble swallowing and voice change.   Eyes: Negative for visual disturbance.  Respiratory: Negative for cough, shortness of breath and wheezing.   Cardiovascular:  Positive for chest pain.  Gastrointestinal: Negative for abdominal distention and abdominal pain.  Genitourinary: Negative for difficulty urinating and dysuria.  Musculoskeletal: Negative for arthralgias and joint swelling.  Neurological: Positive for seizures and headaches. Negative for syncope and weakness.  Hematological: Negative for adenopathy. Does not bruise/bleed easily.  Psychiatric/Behavioral: The patient is nervous/anxious.   All other systems reviewed and are negative.   BP 100/70 (BP Location: Right Arm, Patient Position: Sitting, Cuff Size: Normal)   Pulse 66   Resp 18   Ht 5' 4.5" (1.638 m)   Wt 142 lb (64.4 kg)   SpO2 93% Comment: RA  BMI 24.00 kg/m  Physical Exam  Constitutional: She is oriented to person, place, and time. She appears well-developed and well-nourished.  No acute distress but obviously uncomfortable from Port-A-Cath placement  HENT:  Head: Normocephalic and atraumatic.  Eyes: Pupils are equal, round, and reactive to light. Conjunctivae and EOM are normal. No scleral icterus.  Neck: No thyromegaly present.  Cardiovascular: Normal rate, regular rhythm and normal heart sounds.  No murmur heard. Pulmonary/Chest: Effort normal and breath sounds normal. No respiratory distress. She has no wheezes. She has no rales.  Port-A-Cath in place left chest, incision healing well  Abdominal: Soft. She exhibits no distension. There is no tenderness.  Lymphadenopathy:    She has no cervical adenopathy.  Neurological: She is alert and oriented to person, place, and time. No cranial nerve deficit. She exhibits normal muscle tone. Coordination normal.  Skin: Skin is warm and dry.  Vitals reviewed.  Diagnostic Tests: CT CHEST WITH CONTRAST  TECHNIQUE: Multidetector CT imaging of the chest was performed during intravenous contrast administration.  CONTRAST:  52mL OMNIPAQUE IOHEXOL 300 MG/ML  SOLN  COMPARISON:  Chest radiograph of earlier today.  No prior  CT.  FINDINGS: Cardiovascular: Aortic atherosclerosis. Normal heart size, without pericardial effusion. No central pulmonary embolism, on this non-dedicated study.  Mediastinum/Nodes: No supraclavicular adenopathy. Partially necrotic posterior right mediastinal nodal mass of 3.2 x 2.9 cm on image 51/2. This is intimately associated with the T4 vertebral body, without gross osseous involvement.  Right hilar adenopathy at 2.0 cm on image 67/2. Precarinal necrotic node of 9 mm on image 58/2.  Lungs/Pleura: No pleural fluid. Mass-effect upon right upper lobe bronchus with compression or involvement of the posterior segmental bronchus, including on image 59/4.  Moderate bullous type emphysema with centrilobular and paraseptal components.  Favor pulmonary parenchymal primary lung lesion involving the central superior segment right lower lobe.  Example 2.8 x 2.4 cm on image 61/4. More patchy central right upper lobe consolidation including on image 53/4.  Clear left lung.  Upper Abdomen: Normal imaged portions of the liver, spleen, stomach, pancreas, gallbladder, adrenal glands, kidneys.  Musculoskeletal: Moderate thoracic spondylosis. Mild T11 compression deformity without vertebral canal encroachment.  IMPRESSION: 1. Necrotic adenopathy within the mediastinum and right hilum, likely representing metastatic primary bronchogenic carcinoma. A central superior segment right lower lobe nodule likely represents the primary. This could alternatively represent another focus of nodal metastasis. 2. Right upper lobe central consolidation is favored to represent postobstructive pneumonitis. 3. The dominant posterior mediastinal lesion is intimately associated with the fourth right rib, without gross osseous destruction. 4. Aortic atherosclerosis (ICD10-I70.0) and emphysema (ICD10-J43.9).  Recommend multidisciplinary referral for PET and/or tissue  sampling.   Electronically Signed   By: Abigail Miyamoto M.D.   On: 02/23/2018 19:38 NUCLEAR MEDICINE PET SKULL BASE TO THIGH  TECHNIQUE: 7.2 mCi F-18 FDG was injected intravenously. Full-ring PET imaging was performed from the skull base to thigh after the radiotracer. CT data was obtained and used for attenuation correction and anatomic localization.  Fasting blood glucose: 125 mg/dl  COMPARISON:  Chest CT 02/23/2018.  FINDINGS: Mediastinal blood pool activity: SUV max 1.7  NECK: Laryngeal activity is likely physiologic/due to phonation after radiopharmaceutical injection. No cervical nodal hypermetabolism.  Incidental CT findings: No cervical adenopathy.  CHEST: Hypermetabolism corresponding to previously described posterior right mediastinal confluent necrotic adenopathy. This is contiguous today with the soft tissue density within or adjacent the superior segment right lower lobe described on the prior CT. In total, this soft tissue density measures on the order of 4.4 x 4.1 cm and a S.U.V. max of 26.3, including on image 70/4. This is again intimately associated with the anterior aspect of the T4 vertebral body, without gross osseous destruction. Example image 65/4.  Adjacent subcarinal node of 9 mm is also hypermetabolic.  Incidental CT findings: Aortic atherosclerosis. Redemonstration of presumed postobstructive pneumonitis within the right upper lobe and right apex. Moderate bullous type emphysema. A left lower lobe pulmonary nodule of 5 mm on image 100/4 is below PET resolution.  ABDOMEN/PELVIS: No abdominopelvic parenchymal or nodal hypermetabolism.  Incidental CT findings: Normal adrenal glands. Abdominal aortic and branch vessel atherosclerosis. Hysterectomy.  SKELETON: No abnormal marrow activity.  Incidental CT findings: Probable bone islands within the pelvis.  IMPRESSION: 1. Hypermetabolism, corresponding to the right sided  mediastinal and suprahilar nodal mass. The mass is again intimately associated with the anterior aspect of the T4 vertebral body, without gross osseous destruction. 2. An adjacent/contiguous hypermetabolic subcarinal node is consistent with metastasis. 3. No separate pulmonary parenchymal primary identified. No evidence of extrathoracic hypermetabolic metastasis. 4. Aortic atherosclerosis (ICD10-I70.0) and emphysema (ICD10-J43.9).   Electronically Signed   By: Abigail Miyamoto M.D.   On: 03/11/2018 11:17 I personally reviewed the CT and PET/CT images and concur with the findings noted above  Impression: Sharon Berg is a 52 year old smoker with a right hilar mass with hilar and mediastinal adenopathy.  This almost certainly is a new primary bronchogenic carcinoma.  She needs a tissue diagnosis in order to initiate treatment.  The main differential is small cell versus non-small cell.  We will try to obtain enough tissue for molecular testing should this turn out to be non-small cell carcinoma.  I recommended that we proceed with bronchoscopy and endobronchial ultrasound for diagnostic purposes.  I discussed the general nature of the procedure with Sharon Berg and her  mother.  We we will plan to do this under general anesthesia.  We will plan to do it on an outpatient basis.  I informed him of the indications were, risk, benefits, and alternatives.  They understand the risks include those associated with general anesthesia, such as MI, DVT, PE, stroke, as well as procedural specific risks such as bleeding, pneumothorax, failure to make a diagnosis, as well as the possibility of other unforeseeable complications.  She accepts the risks and wishes to proceed.  Plan:  Bronchoscopy and endobronchial ultrasound on Monday, 03/21/2018  Melrose Nakayama, MD [facility] [phone]

## 2018-03-16 ENCOUNTER — Encounter (HOSPITAL_COMMUNITY): Payer: Self-pay | Admitting: *Deleted

## 2018-03-16 ENCOUNTER — Other Ambulatory Visit: Payer: Self-pay

## 2018-03-16 ENCOUNTER — Telehealth: Payer: Self-pay

## 2018-03-16 ENCOUNTER — Ambulatory Visit (HOSPITAL_COMMUNITY): Payer: 59 | Admitting: Hematology

## 2018-03-16 NOTE — Telephone Encounter (Signed)
Nutrition  Called patient's husband at the request of Diane, English as a second language teacher navigator.  Husband had questions regarding food to prepare for patient.  Patient is scheduled to see RD on 12/6.  Chart reviewed and treatment plan not determined at this time.  Noted 8 pound weight loss per chart since June 2019.  Husband wanting to know what he can prepare for wife to eat.   Intervention: Discussed briefly good sources of protein and well balanced diet to maintain weight. RD to be able to provide further recommendations once treatment planned determined.  Husband wanting to maybe reschedule RD appointment on 12/6.  "I don't know what are frame of mind will be like after we talked to the Dr. On Friday."  "I think I want to talk with Diane and reschedule our meeting."  Encouraged husband to definitely reach out to nurse navigator to reschedule RD visit when it is beneficial for him and his wife.  Let husband know that RDs are available Tuesday am and Friday for nutrition visits.  Patient verbalized understanding.   Chayse Zatarain B. Zenia Resides, [place], [place] Registered Dietitian [phone] (pager)

## 2018-03-20 NOTE — Anesthesia Preprocedure Evaluation (Addendum)
Anesthesia Evaluation  Patient identified by MRN, date of birth, ID band Patient awake    Reviewed: Allergy & Precautions, NPO status , Patient's Chart, lab work & pertinent test results  History of Anesthesia Complications Negative for: history of anesthetic complications  Airway Mallampati: II  TM Distance: >3 FB Neck ROM: Full    Dental no notable dental hx. (+) Teeth Intact, Dental Advisory Given   Pulmonary Current Smoker,    Pulmonary exam normal        Cardiovascular negative cardio ROS Normal cardiovascular exam     Neuro/Psych  Headaches, PSYCHIATRIC DISORDERS Anxiety Depression    GI/Hepatic negative GI ROS, Neg liver ROS,   Endo/Other  negative endocrine ROS  Renal/GU negative Renal ROS  negative genitourinary   Musculoskeletal negative musculoskeletal ROS (+)   Abdominal   Peds  Hematology negative hematology ROS (+)   Anesthesia Other Findings 52 yo F with hilar mass for endobronchial ultrasound - ANX/DEP, migraines, lung cancer, current smoker  Reproductive/Obstetrics                           Anesthesia Physical Anesthesia Plan  ASA: II  Anesthesia Plan: General   Post-op Pain Management:    Induction: Intravenous  PONV Risk Score and Plan: 2 and Ondansetron, Dexamethasone, Midazolam and Treatment may vary due to age or medical condition  Airway Management Planned: Oral ETT  Additional Equipment: None  Intra-op Plan:   Post-operative Plan: Extubation in OR  Informed Consent: I have reviewed the patients History and Physical, chart, labs and discussed the procedure including the risks, benefits and alternatives for the proposed anesthesia with the patient or authorized representative who has indicated his/her understanding and acceptance.   Dental advisory given  Plan Discussed with: CRNA, Anesthesiologist and Surgeon  Anesthesia Plan Comments:         Anesthesia Quick Evaluation

## 2018-03-21 ENCOUNTER — Ambulatory Visit (HOSPITAL_COMMUNITY)
Admission: RE | Admit: 2018-03-21 | Discharge: 2018-03-21 | Disposition: A | Discharge status: Home / Self Care | Payer: 59 | Source: Ambulatory Visit | Attending: Thoracic Surgery (Cardiothoracic Vascular Surgery) | Admitting: Thoracic Surgery (Cardiothoracic Vascular Surgery)

## 2018-03-21 ENCOUNTER — Ambulatory Visit (HOSPITAL_COMMUNITY): Payer: 59 | Admitting: Anesthesiology

## 2018-03-21 ENCOUNTER — Encounter (HOSPITAL_COMMUNITY)
Admission: RE | Disposition: A | Discharge status: Home / Self Care | Payer: Self-pay | Source: Ambulatory Visit | Attending: Thoracic Surgery (Cardiothoracic Vascular Surgery)

## 2018-03-21 ENCOUNTER — Other Ambulatory Visit: Payer: Self-pay

## 2018-03-21 ENCOUNTER — Ambulatory Visit (HOSPITAL_COMMUNITY): Payer: 59

## 2018-03-21 ENCOUNTER — Encounter (HOSPITAL_COMMUNITY): Payer: Self-pay

## 2018-03-21 DIAGNOSIS — R918 Other nonspecific abnormal finding of lung field: Secondary | ICD-10-CM

## 2018-03-21 DIAGNOSIS — Z881 Allergy status to other antibiotic agents status: Secondary | ICD-10-CM | POA: Insufficient documentation

## 2018-03-21 DIAGNOSIS — J439 Emphysema, unspecified: Secondary | ICD-10-CM | POA: Insufficient documentation

## 2018-03-21 DIAGNOSIS — J984 Other disorders of lung: Secondary | ICD-10-CM | POA: Diagnosis not present

## 2018-03-21 DIAGNOSIS — C3411 Malignant neoplasm of upper lobe, right bronchus or lung: Secondary | ICD-10-CM | POA: Insufficient documentation

## 2018-03-21 DIAGNOSIS — F17210 Nicotine dependence, cigarettes, uncomplicated: Secondary | ICD-10-CM | POA: Diagnosis not present

## 2018-03-21 DIAGNOSIS — F419 Anxiety disorder, unspecified: Secondary | ICD-10-CM | POA: Diagnosis not present

## 2018-03-21 DIAGNOSIS — F329 Major depressive disorder, single episode, unspecified: Secondary | ICD-10-CM | POA: Diagnosis not present

## 2018-03-21 DIAGNOSIS — Z79899 Other long term (current) drug therapy: Secondary | ICD-10-CM | POA: Diagnosis not present

## 2018-03-21 DIAGNOSIS — R599 Enlarged lymph nodes, unspecified: Secondary | ICD-10-CM

## 2018-03-21 DIAGNOSIS — C771 Secondary and unspecified malignant neoplasm of intrathoracic lymph nodes: Secondary | ICD-10-CM | POA: Insufficient documentation

## 2018-03-21 DIAGNOSIS — R591 Generalized enlarged lymph nodes: Secondary | ICD-10-CM

## 2018-03-21 HISTORY — PX: VIDEO BRONCHOSCOPY WITH ENDOBRONCHIAL ULTRASOUND: SHX6177

## 2018-03-21 HISTORY — DX: Pneumonia, unspecified organism: J18.9

## 2018-03-21 LAB — COMPREHENSIVE METABOLIC PANEL
ALT: 18 U/L (ref 0–44)
AST: 29 U/L (ref 15–41)
Albumin: 3.5 g/dL (ref 3.5–5.0)
Alkaline Phosphatase: 81 U/L (ref 38–126)
Anion gap: 10 (ref 5–15)
BUN: 15 mg/dL (ref 6–20)
CO2: 25 mmol/L (ref 22–32)
Calcium: 9.7 mg/dL (ref 8.9–10.3)
Chloride: 103 mmol/L (ref 98–111)
Creatinine, Ser: 0.85 mg/dL (ref 0.44–1.00)
GFR calc Af Amer: >60 mL/min (ref >60)
GFR calc non Af Amer: >60 mL/min (ref >60)
Glucose, Bld: 114 mg/dL — ABNORMAL HIGH (ref 70–99)
Potassium: 4 mmol/L (ref 3.5–5.1)
Sodium: 138 mmol/L (ref 135–145)
Total Bilirubin: 0.8 mg/dL (ref 0.3–1.2)
Total Protein: 6.1 g/dL — ABNORMAL LOW (ref 6.5–8.1)

## 2018-03-21 LAB — CBC
HCT: 49.8 % — ABNORMAL HIGH (ref 36.0–46.0)
Hemoglobin: 15.8 g/dL — ABNORMAL HIGH (ref 12.0–15.0)
MCH: 29.4 pg (ref 26.0–34.0)
MCHC: 31.7 g/dL (ref 30.0–36.0)
MCV: 92.7 fL (ref 80.0–100.0)
NRBC: 0 % (ref 0.0–0.2)
Platelets: 273 10*3/uL (ref 150–400)
RBC: 5.37 MIL/uL — AB (ref 3.87–5.11)
RDW: 12.3 % (ref 11.5–15.5)
WBC: 14.9 10*3/uL — AB (ref 4.0–10.5)

## 2018-03-21 LAB — APTT: aPTT: 31 seconds (ref 24–36)

## 2018-03-21 LAB — PROTIME-INR
INR: 0.94
PROTHROMBIN TIME: 12.5 s (ref 11.4–15.2)

## 2018-03-21 SURGERY — BRONCHOSCOPY, WITH EBUS
Anesthesia: General | Site: Chest

## 2018-03-21 MED ORDER — ROCURONIUM BROMIDE 10 MG/ML (PF) SYRINGE
PREFILLED_SYRINGE | INTRAVENOUS | Status: DC | PRN
  Administered 2018-03-21: 30 mg via INTRAVENOUS
  Administered 2018-03-21 (×2): 10 mg via INTRAVENOUS

## 2018-03-21 MED ORDER — EPINEPHRINE PF 1 MG/ML IJ SOLN
INTRAMUSCULAR | Status: DC | PRN
  Administered 2018-03-21: 1 mg

## 2018-03-21 MED ORDER — EPHEDRINE SULFATE-NACL 50-0.9 MG/10ML-% IV SOSY
PREFILLED_SYRINGE | INTRAVENOUS | Status: DC | PRN
  Administered 2018-03-21: 10 mg via INTRAVENOUS

## 2018-03-21 MED ORDER — PROPOFOL 10 MG/ML IV BOLUS
INTRAVENOUS | Status: AC
  Filled 2018-03-21: qty 20

## 2018-03-21 MED ORDER — SUFENTANIL CITRATE 50 MCG/ML IV SOLN
INTRAVENOUS | Status: DC | PRN
  Administered 2018-03-21: 10 ug via INTRAVENOUS
  Administered 2018-03-21: 5 ug via INTRAVENOUS

## 2018-03-21 MED ORDER — SUFENTANIL CITRATE 50 MCG/ML IV SOLN
INTRAVENOUS | Status: AC
  Filled 2018-03-21: qty 1

## 2018-03-21 MED ORDER — PHENYLEPHRINE 40 MCG/ML (10ML) SYRINGE FOR IV PUSH (FOR BLOOD PRESSURE SUPPORT)
PREFILLED_SYRINGE | INTRAVENOUS | Status: DC | PRN
  Administered 2018-03-21: 80 ug via INTRAVENOUS
  Administered 2018-03-21: 40 ug via INTRAVENOUS
  Administered 2018-03-21 (×3): 80 ug via INTRAVENOUS
  Administered 2018-03-21: 40 ug via INTRAVENOUS
  Administered 2018-03-21: 80 ug via INTRAVENOUS

## 2018-03-21 MED ORDER — ONDANSETRON HCL 4 MG/2ML IJ SOLN
INTRAMUSCULAR | Status: AC
  Filled 2018-03-21: qty 2

## 2018-03-21 MED ORDER — MIDAZOLAM HCL 5 MG/5ML IJ SOLN
INTRAMUSCULAR | Status: DC | PRN
  Administered 2018-03-21: 2 mg via INTRAVENOUS

## 2018-03-21 MED ORDER — DEXAMETHASONE SODIUM PHOSPHATE 10 MG/ML IJ SOLN
INTRAMUSCULAR | Status: AC
  Filled 2018-03-21: qty 1

## 2018-03-21 MED ORDER — MIDAZOLAM HCL 2 MG/2ML IJ SOLN
INTRAMUSCULAR | Status: AC
  Filled 2018-03-21: qty 2

## 2018-03-21 MED ORDER — LACTATED RINGERS IV SOLN
INTRAVENOUS | Status: DC | PRN
  Administered 2018-03-21 (×2): via INTRAVENOUS

## 2018-03-21 MED ORDER — SUGAMMADEX SODIUM 200 MG/2ML IV SOLN
INTRAVENOUS | Status: DC | PRN
  Administered 2018-03-21: 200 mg via INTRAVENOUS

## 2018-03-21 MED ORDER — ONDANSETRON HCL 4 MG/2ML IJ SOLN
INTRAMUSCULAR | Status: DC | PRN
  Administered 2018-03-21: 4 mg via INTRAVENOUS

## 2018-03-21 MED ORDER — 0.9 % SODIUM CHLORIDE (POUR BTL) OPTIME
TOPICAL | Status: DC | PRN
  Administered 2018-03-21: 1000 mL

## 2018-03-21 MED ORDER — SODIUM CHLORIDE (PF) 0.9 % IJ SOLN
INTRAMUSCULAR | Status: AC
  Filled 2018-03-21: qty 10

## 2018-03-21 MED ORDER — LIDOCAINE 2% (20 MG/ML) 5 ML SYRINGE
INTRAMUSCULAR | Status: AC
  Filled 2018-03-21: qty 5

## 2018-03-21 MED ORDER — ROCURONIUM BROMIDE 50 MG/5ML IV SOSY
PREFILLED_SYRINGE | INTRAVENOUS | Status: AC
  Filled 2018-03-21: qty 5

## 2018-03-21 MED ORDER — LIDOCAINE 2% (20 MG/ML) 5 ML SYRINGE
INTRAMUSCULAR | Status: DC | PRN
  Administered 2018-03-21: 100 mg via INTRAVENOUS

## 2018-03-21 MED ORDER — DEXAMETHASONE SODIUM PHOSPHATE 10 MG/ML IJ SOLN
INTRAMUSCULAR | Status: DC | PRN
  Administered 2018-03-21: 10 mg via INTRAVENOUS

## 2018-03-21 MED ORDER — PROPOFOL 10 MG/ML IV BOLUS
INTRAVENOUS | Status: DC | PRN
  Administered 2018-03-21: 120 mg via INTRAVENOUS
  Administered 2018-03-21: 40 mg via INTRAVENOUS

## 2018-03-21 SURGICAL SUPPLY — 39 items
ADAPTER VALVE BIOPSY EBUS (MISCELLANEOUS) ×1 IMPLANT
ADPTR VALVE BIOPSY EBUS (MISCELLANEOUS) ×1
BRUSH CYTOL CELLEBRITY 1.5X140 (MISCELLANEOUS) IMPLANT
BRUSH CYTOL CELLEBRITY 1.9X150 (MISCELLANEOUS) ×2 IMPLANT
CANISTER SUCT 3000ML PPV (MISCELLANEOUS) ×2 IMPLANT
CONT SPEC 4OZ CLIKSEAL STRL BL (MISCELLANEOUS) ×6 IMPLANT
COVER BACK TABLE 60X90IN (DRAPES) ×2 IMPLANT
FILTER STRAW FLUID ASPIR (MISCELLANEOUS) IMPLANT
FORCEPS BIOP RJ4 1.8 (CUTTING FORCEPS) IMPLANT
FORCEPS RADIAL JAW LRG 4 PULM (INSTRUMENTS) ×1 IMPLANT
GAUZE SPONGE 4X4 12PLY STRL (GAUZE/BANDAGES/DRESSINGS) IMPLANT
GLOVE BIOGEL PI IND STRL 6.5 (GLOVE) ×2 IMPLANT
GLOVE BIOGEL PI INDICATOR 6.5 (GLOVE) ×2
GLOVE SURG SIGNA 7.5 PF LTX (GLOVE) ×2 IMPLANT
GOWN STRL REUS W/ TWL XL LVL3 (GOWN DISPOSABLE) ×1 IMPLANT
GOWN STRL REUS W/TWL XL LVL3 (GOWN DISPOSABLE) ×1
KIT CLEAN ENDO COMPLIANCE (KITS) ×4 IMPLANT
KIT TURNOVER KIT B (KITS) ×2 IMPLANT
MARKER SKIN DUAL TIP RULER LAB (MISCELLANEOUS) ×2 IMPLANT
NEEDLE ASPIRATION VIZISHOT 19G (NEEDLE) ×2 IMPLANT
NEEDLE ASPIRATION VIZISHOT 21G (NEEDLE) ×2 IMPLANT
NEEDLE BLUNT 18X1 FOR OR ONLY (NEEDLE) IMPLANT
NS IRRIG 1000ML POUR BTL (IV SOLUTION) ×2 IMPLANT
OIL SILICONE PENTAX (PARTS (SERVICE/REPAIRS)) ×2 IMPLANT
PAD ARMBOARD 7.5X6 YLW CONV (MISCELLANEOUS) ×4 IMPLANT
RADIAL JAW LRG 4 PULMONARY (INSTRUMENTS) ×1
SYR 20CC LL (SYRINGE) ×2 IMPLANT
SYR 20ML ECCENTRIC (SYRINGE) ×4 IMPLANT
SYR 3ML LL SCALE MARK (SYRINGE) IMPLANT
SYR 5ML LL (SYRINGE) ×2 IMPLANT
SYR 5ML LUER SLIP (SYRINGE) ×2 IMPLANT
TOWEL GREEN STERILE (TOWEL DISPOSABLE) ×2 IMPLANT
TOWEL GREEN STERILE FF (TOWEL DISPOSABLE) ×2 IMPLANT
TRAP SPECIMEN MUCOUS 40CC (MISCELLANEOUS) ×2 IMPLANT
TUBE CONNECTING 20X1/4 (TUBING) ×2 IMPLANT
VALVE BIOPSY  SINGLE USE (MISCELLANEOUS) ×1
VALVE BIOPSY SINGLE USE (MISCELLANEOUS) ×1 IMPLANT
VALVE SUCTION BRONCHIO DISP (MISCELLANEOUS) ×2 IMPLANT
WATER STERILE IRR 1000ML POUR (IV SOLUTION) ×2 IMPLANT

## 2018-03-21 NOTE — Brief Op Note (Signed)
03/21/2018  8:56 AM  PATIENT:  Sharon Berg  52 y.o. female  PRE-OPERATIVE DIAGNOSIS:  RIGHT HILAR MASS ADENOPATHY- suspected T3N2 lung cancer  POST-OPERATIVE DIAGNOSIS:  Non-small cell carcinoma- T3N2  PROCEDURE:  Procedure(s): VIDEO BRONCHOSCOPY WITH ENDOBRONCHIAL ULTRASOUND (N/A)  Needle aspirations, brushings, endobronchial biopsies  SURGEON:  Surgeon(s) and Role:    * Melrose Nakayama, MD - Primary  PHYSICIAN ASSISTANT:   ASSISTANTS: none   ANESTHESIA:   general  EBL: minimal  BLOOD ADMINISTERED:none  DRAINS: none   LOCAL MEDICATIONS USED:  NONE  SPECIMEN:  Source of Specimen:  Lymph nodes, RUL mass  DISPOSITION OF SPECIMEN:  PATHOLOGY  COUNTS:  NO endoscopic  TOURNIQUET:  * No tourniquets in log *  DICTATION: .Other Dictation: Dictation Number -  PLAN OF CARE: Discharge to home after PACU  PATIENT DISPOSITION:  PACU - hemodynamically stable.   Delay start of Pharmacological VTE agent (>24hrs) due to surgical blood loss or risk of bleeding: not applicable

## 2018-03-21 NOTE — Anesthesia Procedure Notes (Signed)
Procedure Name: Intubation Date/Time: 03/21/2018 7:38 AM Performed by: Moshe Salisbury, CRNA Pre-anesthesia Checklist: Patient identified, Emergency Drugs available, Suction available and Patient being monitored Patient Re-evaluated:Patient Re-evaluated prior to induction Oxygen Delivery Method: Circle System Utilized Preoxygenation: Pre-oxygenation with 100% oxygen Induction Type: IV induction Ventilation: Mask ventilation without difficulty Laryngoscope Size: Mac and 3 Grade View: Grade I Tube type: Oral Tube size: 9.0 mm Number of attempts: 2 (First attempt into esophagus and removed, second attempt through [facility] Airway Equipment and Method: Stylet Placement Confirmation: ETT inserted through vocal cords under direct vision,  positive ETCO2 and breath sounds checked- equal and bilateral Secured at: 20 cm Tube secured with: Tape Dental Injury: Teeth and Oropharynx as per pre-operative assessment

## 2018-03-21 NOTE — Anesthesia Postprocedure Evaluation (Signed)
Anesthesia Post Note  Patient: Sharon Berg  Procedure(s) Performed: VIDEO BRONCHOSCOPY WITH ENDOBRONCHIAL ULTRASOUND (N/A Chest)     Patient location during evaluation: PACU Anesthesia Type: General Level of consciousness: awake and alert Pain management: pain level controlled Vital Signs Assessment: post-procedure vital signs reviewed and stable Respiratory status: spontaneous breathing, nonlabored ventilation and respiratory function stable Cardiovascular status: blood pressure returned to baseline and stable Postop Assessment: no apparent nausea or vomiting Anesthetic complications: no    Last Vitals:  Vitals:   03/21/18 0924 03/21/18 0930  BP: 109/61 110/62  Pulse: 90 84  Resp: (!) 24 20  Temp: 36.6 C   SpO2: 93% 95%    Last Pain:  Vitals:   03/21/18 0930  TempSrc:   PainSc: 0-No pain                 Lidia Collum

## 2018-03-21 NOTE — Transfer of Care (Signed)
Immediate Anesthesia Transfer of Care Note  Patient: Sharon Berg  Procedure(s) Performed: VIDEO BRONCHOSCOPY WITH ENDOBRONCHIAL ULTRASOUND (N/A Chest)  Patient Location: PACU  Anesthesia Type:General  Level of Consciousness: awake, oriented and patient cooperative  Airway & Oxygen Therapy: Patient Spontanous Breathing and Patient connected to nasal cannula oxygen  Post-op Assessment: Report given to RN, Post -op Vital signs reviewed and stable and Patient moving all extremities  Post vital signs: Reviewed and stable  Last Vitals:  Vitals Value Taken Time  BP    Temp    Pulse    Resp    SpO2      Last Pain:  Vitals:   03/21/18 0629  TempSrc:   PainSc: 5          Complications: No apparent anesthesia complications

## 2018-03-21 NOTE — Discharge Instructions (Addendum)
Do not drive or engage in heavy physical activity for 24 hours.  You may resume normal activities tomorrow  You may cough up small amounts of blood over the next few days.  You may use acetaminophen (Tylenol) if needed for discomfort  Call [phone] if you develop chest pain, shortness of breath, fever > 101F or cough up more than 2 tablespoons of blood.  Follow up with Dr. Delton Coombes in [place]

## 2018-03-21 NOTE — Interval H&P Note (Signed)
History and Physical Interval Note:  03/21/2018 7:25 AM  Sharon Berg  has presented today for surgery, with the diagnosis of RIGHT HILAR MASS ADENOPATHY  The various methods of treatment have been discussed with the patient and family. After consideration of risks, benefits and other options for treatment, the patient has consented to  Procedure(s): [place] (N/A) as a surgical intervention .  The patient's history has been reviewed, patient examined, no change in status, stable for surgery.  I have reviewed the patient's chart and labs.  Questions were answered to the patient's satisfaction.     Melrose Nakayama

## 2018-03-21 NOTE — Op Note (Signed)
NAME: MARYLAND, STELL MEDICAL RECORD GQ:[id number] ACCOUNT [id number] DATE OF BIRTH:December 05, 1965 FACILITY: [facility] LOCATION: [facility] PHYSICIAN:Jhonathan Desroches Chaya Jan, MD  OPERATIVE REPORT  DATE OF PROCEDURE:  03/21/2018  PREOPERATIVE DIAGNOSIS:  Right hilar mass, suspect T3, N2 lung cancer.  POSTOPERATIVE DIAGNOSIS:  Nonsmall cell carcinoma T3, N2.  PROCEDURE:  Video bronchoscopy with brushings and endobronchial biopsies and endobronchial ultrasound with mediastinal lymph node aspirations.  SURGEON:  Modesto Charon, MD  ASSISTANT:  None.  ANESTHESIA:  General.  FINDINGS:  Endobronchial tumor seen at the origin of right upper lobe bronchus. Level 7 and 4R needle aspirations positive for nonsmall cell carcinoma.  Brushings were positive for nonsmall cell carcinoma.  CLINICAL NOTE:  The patient is a 52 year old woman with a history of tobacco abuse who presented with chest pain and weight loss.  She was found to have a central right hilar mass with hilar and mediastinal adenopathy.  These areas were positive on PET CT.  She  needs a biopsy for diagnosis and staging purposes.  The indications, risks, benefits, and alternatives were discussed in detail with the patient.  She understood and accepted the risks and agreed to proceed.  DESCRIPTION OF PROCEDURE:  The patient was brought to the operating room on 03/21/2018.  She had induction of general anesthesia and was intubated.  A timeout was performed.  Flexible fiberoptic bronchoscopy was performed via the endotracheal tube.   There was normal endobronchial anatomy. In the trachea there was some mass effect posteriorly near the carina.  In the right upper lobe bronchus, there was significant edema and mucosal abnormality and endobronchial tumor.  The bronchoscope was  removed.  The endobronchial ultrasound probe was advanced. Level 7 and 4R nodes were identified and aspirations were performed from each of these nodes.  Aspirations were  performed both with and without suction applied.  Multiple aspirations were performed of  each node.  The endobronchial ultrasound probe was removed.    The bronchoscope was reinserted.  Brushings were obtained from the right upper lobe bronchus.  Initial inspection of the brushings and the needle aspiration showed adequate specimens and they were sent  to pathology.  While awaiting those results, multiple biopsies were obtained.  There was some minor bleeding with biopsies, which was controlled with the topical application of dilute epinephrine and irrigation with saline.  The quick preps returned showing  nonsmall cell carcinoma.  Multiple additional biopsies were obtained.  There was no ongoing bleeding the bronchoscope was removed.    The patient was extubated in the operating room and taken to the [place] [facility] in good condition.  AN/NUANCE  D:03/21/2018 T:03/21/2018 JOB:[id number]

## 2018-03-22 ENCOUNTER — Other Ambulatory Visit: Payer: Self-pay | Admitting: General Surgery

## 2018-03-22 ENCOUNTER — Encounter (HOSPITAL_COMMUNITY): Payer: Self-pay | Admitting: Thoracic Surgery (Cardiothoracic Vascular Surgery)

## 2018-03-22 DIAGNOSIS — C3491 Malignant neoplasm of unspecified part of right bronchus or lung: Secondary | ICD-10-CM

## 2018-03-22 MED ORDER — HYDROCODONE-ACETAMINOPHEN 5-325 MG PO TABS: 1.0000 | ORAL_TABLET | ORAL | 0 refills | Status: DC | PRN

## 2018-03-25 ENCOUNTER — Inpatient Hospital Stay (HOSPITAL_COMMUNITY): Payer: 59 | Attending: Hematology | Admitting: Hematology

## 2018-03-25 ENCOUNTER — Encounter (HOSPITAL_COMMUNITY): Payer: 59

## 2018-03-25 ENCOUNTER — Encounter (HOSPITAL_COMMUNITY): Payer: Self-pay | Admitting: Hematology

## 2018-03-25 ENCOUNTER — Other Ambulatory Visit: Payer: Self-pay | Admitting: *Deleted

## 2018-03-25 ENCOUNTER — Other Ambulatory Visit: Payer: Self-pay

## 2018-03-25 DIAGNOSIS — Z5111 Encounter for antineoplastic chemotherapy: Secondary | ICD-10-CM | POA: Diagnosis not present

## 2018-03-25 DIAGNOSIS — Z79899 Other long term (current) drug therapy: Secondary | ICD-10-CM | POA: Insufficient documentation

## 2018-03-25 DIAGNOSIS — F329 Major depressive disorder, single episode, unspecified: Secondary | ICD-10-CM | POA: Insufficient documentation

## 2018-03-25 DIAGNOSIS — R079 Chest pain, unspecified: Secondary | ICD-10-CM

## 2018-03-25 DIAGNOSIS — C3431 Malignant neoplasm of lower lobe, right bronchus or lung: Secondary | ICD-10-CM | POA: Diagnosis not present

## 2018-03-25 DIAGNOSIS — F419 Anxiety disorder, unspecified: Secondary | ICD-10-CM | POA: Diagnosis not present

## 2018-03-25 DIAGNOSIS — F17210 Nicotine dependence, cigarettes, uncomplicated: Secondary | ICD-10-CM | POA: Diagnosis not present

## 2018-03-25 DIAGNOSIS — R5383 Other fatigue: Secondary | ICD-10-CM | POA: Insufficient documentation

## 2018-03-25 DIAGNOSIS — R0789 Other chest pain: Secondary | ICD-10-CM | POA: Diagnosis not present

## 2018-03-25 NOTE — Progress Notes (Signed)
Faxed referral and patient records to [facility].

## 2018-03-25 NOTE — Progress Notes (Signed)
[place], [place] [address]   CLINIC:  Medical Oncology/Hematology  PCP:  [name], [place] [address] [phone]   REASON FOR VISIT: Follow-up for adenocarcinoma of the lung.  CURRENT THERAPY: Chemoradiation    INTERVAL HISTORY:  Sharon Berg 52 y.o. female returns for routine follow-up non small cell lung cancer. She is here today with her family. She is having pain in her chest and tingling in her back. Her appetite has been decreased and lost 6 pounds over the past few weeks. She is going to try supplemental drinks. She is more fatigued than normal. She denies any fevers or recent infections. Denies any headaches or vision changes. Denies any nausea, vomiting, or diarrhea. She reports her appetite and energy level at 50%. They will be see the radiation doctor in [place] next week and they will let us know when they are starting.     REVIEW OF SYSTEMS:  Review of Systems  Constitutional: Positive for fatigue.  HENT:   Positive for trouble swallowing.   Neurological: Positive for extremity weakness and numbness.  All other systems reviewed and are negative.    PAST MEDICAL/SURGICAL HISTORY:  Past Medical History:  Diagnosis Date  . Anxiety   . Depression   . Lung cancer ([place])   . Migraine   . Pneumonia   . 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 ABDOMINAL HYSTERECTOMY  2000  . PORTACATH PLACEMENT Left 03/14/2018   Procedure: INSERTION PORT-A-CATH;  Surgeon: Aviva Signs, MD;  Location: [facility];  Service: General;  Laterality: Left;  Marland [place] VIDEO BRONCHOSCOPY WITH ENDOBRONCHIAL ULTRASOUND N/A 03/21/2018   Procedure: VIDEO BRONCHOSCOPY WITH ENDOBRONCHIAL ULTRASOUND;  Surgeon: Melrose Nakayama, MD;  Location: [facility];  Service: Thoracic;  Laterality: N/A;     SOCIAL HISTORY:  Social History   Socioeconomic History  . Marital status: Married      Spouse name: [name]  . Number of children: 1  . Years of education: College  . Highest education level: Not on file  Occupational History  . 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 Financial resource strain: Not hard at all  . Food insecurity:    Worry: Never true    Inability: Never true  . Transportation needs:    Medical: No    Non-medical: No  Tobacco Use  . Smoking status: Current Every Day Smoker    Packs/day: 0.12    Years: 38.00    Pack years: 4.56    Types: Cigarettes  . Smokeless tobacco: Never Used  . Tobacco comment: .2 cigs  a day   Substance and Sexual Activity  . Alcohol use: No    Alcohol/week: 0.0 standard drinks  . Drug use: No  . Sexual activity: Yes    Birth control/protection: Surgical  Lifestyle  . Physical activity:    Days per week: 0 days    Minutes per session: 0 min  . Stress: Rather much  Relationships  . Social connections:    Talks on phone: More than three times a week    Gets together: Once a week    Attends religious service: Never    Active member of club or organization: No    Attends meetings of clubs or organizations: Never    Relationship status: Married  . Intimate partner  violence:    Fear of current or ex partner: No    Emotionally abused: No    Physically abused: No    Forced sexual activity: No  Other Topics Concern  . Not on file  Social History Narrative   Patient is married with one child.   Patient is right handed.   Patient has college education.   Caffeine use: Patient drinks 1 glass of tea daily.    FAMILY HISTORY:  Family History  Problem Relation Age of Onset  . Hypertension Mother   . Diabetes Father   . Heart disease Maternal Grandmother   . Stroke Maternal Grandmother   . Heart disease Maternal Grandfather   . Dementia Paternal Grandmother   . Dementia Paternal Grandfather     CURRENT MEDICATIONS:  Outpatient Encounter Medications as of 03/25/2018   Medication Sig  . ALPRAZolam (XANAX) 1 MG tablet TAKE 1/2 TO 1 TABLET TWICE A DAY FOR ANXIETY (Patient taking differently: Take 1 mg by mouth 2 (two) times daily. TAKE 1/2 TO 1 TABLET TWICE A DAY FOR ANXIETY)  . HYDROcodone-acetaminophen (NORCO/VICODIN) 5-325 MG tablet Take 1 tablet by mouth every 4 (four) hours as needed for moderate pain.  . nicotine (NICODERM CQ - DOSED IN MG/24 HOURS) 21 mg/24hr patch Place 1 patch (21 mg total) onto the skin daily.  . sertraline (ZOLOFT) 100 MG tablet TAKE 1 AND 1/2 TABLETS ONCE DAILY (Patient taking differently: Take 150 mg by mouth daily. )  . traZODone (DESYREL) 100 MG tablet Take 3 tablets (300 mg total) by mouth at bedtime.   No facility-administered encounter medications on file as of 03/25/2018.     ALLERGIES:  Allergies  Allergen Reactions  . Levaquin [Levofloxacin] Other (See Comments)    Myalgia  . Codeine Nausea And Vomiting     PHYSICAL EXAM:  ECOG Performance status: 1  Vitals:   03/25/18 1009  BP: 111/65  Pulse: 78  Resp: 16  Temp: 98.5 F (36.9 C)  SpO2: 96%   Filed Weights   03/25/18 1009  Weight: 138 lb 8 oz (62.8 kg)    Physical Exam  Constitutional: She is oriented to person, place, and time. She appears well-developed and well-nourished.  Musculoskeletal: Normal range of motion.  Neurological: She is alert and oriented to person, place, and time.  Skin: Skin is warm and dry.  Psychiatric: She has a normal mood and affect. Her behavior is normal. Judgment and thought content normal.     LABORATORY DATA:  I have reviewed the labs as listed.  CBC    Component Value Date/Time   WBC 14.9 (H) 03/21/2018 0638   RBC 5.37 (H) 03/21/2018 0638   HGB 15.8 (H) 03/21/2018 0638   HGB 18.1 (H) 09/25/2017 0829   HCT 49.8 (H) 03/21/2018 0638   HCT 52.8 (H) 09/25/2017 0829   PLT 273 03/21/2018 0638   PLT 239 09/25/2017 0829   MCV 92.7 03/21/2018 0638   MCV 91 09/25/2017 0829   MCH 29.4 03/21/2018 0638   MCHC 31.7  03/21/2018 0638   RDW 12.3 03/21/2018 0638   RDW 13.8 09/25/2017 0829   LYMPHSABS 3.4 (H) 09/25/2017 0829   MONOABS 0.9 08/06/2016 1132   EOSABS 0.2 09/25/2017 0829   BASOSABS 0.0 09/25/2017 0829   CMP Latest Ref Rng & Units 03/21/2018 02/23/2018 09/25/2017  Glucose 70 - 99 mg/dL 114(H) 89 133(H)  BUN 6 - 20 mg/dL 15 17 17   Creatinine 0.44 - 1.00 mg/dL 0.85 0.61 0.69  Sodium  135 - 145 mmol/L 138 137 141  Potassium 3.5 - 5.1 mmol/L 4.0 3.4(L) 4.7  Chloride 98 - 111 mmol/L 103 106 105  CO2 22 - 32 mmol/L 25 22 22   Calcium 8.9 - 10.3 mg/dL 9.7 9.4 9.4  Total Protein 6.5 - 8.1 g/dL 6.1(L) - 6.6  Total Bilirubin 0.3 - 1.2 mg/dL 0.8 - 0.4  Alkaline Phos 38 - 126 U/L 81 - 95  AST 15 - 41 U/L 29 - 19  ALT 0 - 44 U/L 18 - 17       DIAGNOSTIC IMAGING:  I have independently reviewed the scans and discussed with the patient.  I have reviewed Francene Finders, NP's note and agree with the documentation.  I personally performed a face-to-face visit, made revisions and my assessment and plan is as follows.     ASSESSMENT & PLAN:   Cancer of right lung ([place]) 1.  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 -She went to the ER on 02/23/2018 when a CT of the chest was done which showed necrotic adenopathy within the mediastinum and right hilum.  A central superior segment right lower lobe nodule likely represents the primary. - She has a 57-pack-year smoking history. -She lost about 12 pounds in the last couple of months.  She does have occasional dysphagia to solid foods. - She does have hemoptysis with bloodstained sputum for the past couple of months. - I reviewed the results of the PET CT scan dated 03/11/2018 which shows posterior right mediastinal confluent necrotic adenopathy.  Right lower lobe mass measuring 4.4 x 4.1 cm, intimately associated with the anterior aspect of the T4 vertebral body, without  gross osseous destruction.  There is a 9 mm subcarinal lymph node.  No other metastatic disease was seen. -We discussed the results of the MRI of the brain dated 02/26/2018 which was negative for metastatic disease. -She underwent bronch with EBUS biopsy on 03/21/2018. -I reviewed the pathology report which was consistent with adenocarcinoma. -I discussed the normal prognosis and treatment plan of stage IIIb non-small cell lung cancer.  She will need combination chemoradiation therapy.  We discussed the side effects of the chemotherapy regimen. -I have called and talked to Dr.Yanagihara who kindly accepted to see this patient next week. - She will receive weekly carboplatin and paclitaxel along with radiation therapy.  We will plan to rescan her 4 weeks after completion of chemoradiation therapy.  She will be considered for immunotherapy with durvalumab for a year if she gets a partial or complete response. -We will see her back in 2 weeks to tentatively start chemotherapy.  She already has a port placed.  2.  Anterior chest pain: - She will continue hydrocodone 5 mg every 6 hours as needed for her midsternal pain.  She also has numbness across her upper back region.          Orders placed this encounter:  No orders of the defined types were placed in this encounter.     Derek [name], MD [place] [phone]

## 2018-03-25 NOTE — Progress Notes (Signed)
Patient case discussed at thoracic cancer conference 03/24/18 and documented for communication purpose.  Patient was in seen or examined at cancer conference.

## 2018-03-25 NOTE — Assessment & Plan Note (Signed)
1.  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 -She went to the ER on 02/23/2018 when a CT of the chest was done which showed necrotic adenopathy within the mediastinum and right hilum.  A central superior segment right lower lobe nodule likely represents the primary. - She has a 57-pack-year smoking history. -She lost about 12 pounds in the last couple of months.  She does have occasional dysphagia to solid foods. - She does have hemoptysis with bloodstained sputum for the past couple of months. - I reviewed the results of the PET CT scan dated 03/11/2018 which shows posterior right mediastinal confluent necrotic adenopathy.  Right lower lobe mass measuring 4.4 x 4.1 cm, intimately associated with the anterior aspect of the T4 vertebral body, without gross osseous destruction.  There is a 9 mm subcarinal lymph node.  No other metastatic disease was seen. -We discussed the results of the MRI of the brain dated 02/26/2018 which was negative for metastatic disease. -She underwent bronch with EBUS biopsy on 03/21/2018. -I reviewed the pathology report which was consistent with adenocarcinoma. -I discussed the normal prognosis and treatment plan of stage IIIb non-small cell lung cancer.  She will need combination chemoradiation therapy.  We discussed the side effects of the chemotherapy regimen. -I have called and talked to Dr.Yanagihara who kindly accepted to see this patient next week. - She will receive weekly carboplatin and paclitaxel along with radiation therapy.  We will plan to rescan her 4 weeks after completion of chemoradiation therapy.  She will be considered for immunotherapy with durvalumab for a year if she gets a partial or complete response. -We will see her back in 2 weeks to tentatively start chemotherapy.  She already has a port placed.  2.  Anterior chest pain: - She will continue hydrocodone 5 mg  every 6 hours as needed for her midsternal pain.  She also has numbness across her upper back region.

## 2018-03-25 NOTE — Progress Notes (Signed)
START ON PATHWAY REGIMEN - Non-Small Cell Lung     Administer weekly:     Paclitaxel      Carboplatin   **Always confirm dose/schedule in your pharmacy ordering system**  Patient Characteristics: Stage III - Unresectable, PS = 0, 1 AJCC T Category: T4 Current Disease Status: No Distant Mets or Local Recurrence AJCC N Category: N2 AJCC M Category: M0 AJCC 8 Stage Grouping: IIIB Performance Status: PS = 0, 1 Intent of Therapy: Curative Intent, Discussed with Patient

## 2018-03-25 NOTE — Patient Instructions (Addendum)
[place] at [facility] Discharge Instructions   You have non-small cell adenocarcinoma of the lung. Stage 3b Carboplatin and paclitaxel are the chemotherapies you will get once weekly. You will have radiation everyday. The radiation MD will determine this schedule and how many treatments you will get.  Let us know when you will be starting radiation after you see the doctor.      Thank you for choosing [place] at [facility] to provide your oncology and hematology care.  To afford each patient quality time with our provider, please arrive at least 15 minutes before your scheduled appointment time.   If you have a lab appointment with the [facility] please come in thru the  Main Entrance and check in at the main information desk  You need to re-schedule your appointment should you arrive 10 or more minutes late.  We strive to give you quality time with our providers, and arriving late affects you and other patients whose appointments are after yours.  Also, if you no show three or more times for appointments you may be dismissed from the clinic at the providers discretion.     Again, thank you for choosing [facility].  Our hope is that these requests will decrease the amount of time that you wait before being seen by our physicians.       _____________________________________________________________  Should you have questions after your visit to [facility], please contact our office at [phone] between the hours of 8:00 a.m. and 4:30 p.m.  Voicemails left after 4:00 p.m. will not be returned until the following business day.  For prescription refill requests, have your pharmacy contact our office and allow 72 hours.    Cancer Center Support Programs:   > Cancer Support Group  2nd Tuesday of the month 1pm-2pm, Journey Room

## 2018-03-29 MED ORDER — PROCHLORPERAZINE MALEATE 10 MG PO TABS: 10.0000 mg | ORAL_TABLET | Freq: Four times a day (QID) | ORAL | 1 refills | Status: DC | PRN

## 2018-03-29 MED ORDER — LIDOCAINE-PRILOCAINE 2.5-2.5 % EX CREA: TOPICAL_CREAM | CUTANEOUS | 3 refills | Status: DC

## 2018-03-29 NOTE — Patient Instructions (Addendum)
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  We are going to be treating you with the intent of controlling your disease. You will be receiving chemotherapy in combination with radiation.  You will get chemotherapy weekly during your radiation treatments through your port a cath.  You will be getting Carboplatin (paraplatin) and Taxol (paclitaxel).  You will see the doctor regularly throughout treatment.  We monitor your lab work prior to every treatment. The doctor monitors your response to treatment by the way you are feeling, your blood work, and scans periodically.  There will be wait times while you are here for treatment.  It will take about 30 minutes to 1 hour for your lab work to result.  Then there will be wait times while pharmacy mixes your medications.   You will receive the following premedication prior to each treatment:   Aloxi -  High powered nausea medication given to reduce the risk of chemotherapy induced nausea  Dexamethasone - steroid - given to reduce the risk of you having an allergic reaction to the chemotherapy.  Dexamethasone can cause you to feel energized, nervous/anxious/jittery, make you have trouble sleeping, and/or make you feel hot/flushed in the face/neck and/or look pink/red in the face/neck. These side effects will pass as the medicine wears off Pepcid - antihistamine helps to prevent allergic reaction to chemotherapy Benadryl - antihistamine helps to prevent allergic reaction to chemotherapy  Carboplatin (Paraplatin, CBDCA)  About This Drug Carboplatin is used to treat cancer. It is given in the vein (IV).  Possible Side Effects . Bone marrow suppression. This is a decrease in the number of white blood cells, red blood cells, and platelets. This may raise your risk of infection, make you tired and weak (fatigue), and raise your risk of bleeding. . Nausea and vomiting (throwing  up) . Weakness . Changes in your liver function . Changes in your kidney function . Electrolyte changes . Pain  Note: Each of the side effects above was reported in 20% or greater of patients treated with carboplatin. Not all possible side effects are included above.  Warnings and Precautions . Severe bone marrow suppression . Allergic reactions, including anaphylaxis are rare but may happen in some patients. Signs of allergic reaction to this drug may be swelling of the face, feeling like your tongue or throat are swelling, trouble breathing, rash, itching, fever, chills, feeling dizzy, and/or feeling that your heart is beating in a fast or not normal way. If this happens, do not take another dose of this drug. You should get urgent medical treatment. . Severe nausea and vomiting . Effects on the nerves are called peripheral neuropathy. This risk is increased if you are over the age of 60 or if you have received other medicine with risk of peripheral neuropathy. You may feel numbness, tingling, or pain in your hands and feet. It may be hard for you to button your clothes, open jars, or walk as usual. The effect on the nerves may get worse with more doses of the drug. These effects get better in some people after the drug is stopped but it does not get better in all people. Marland Kitchen Blurred vision, loss of vision or other changes in eyesight . Decreased hearing . Skin and tissue irritation including redness, pain, warmth, or swelling at the IV site if the drug leaks out of the vein and into nearby tissue. . Severe changes in your  kidney function, which can cause kidney failure . Severe changes in your liver function, which can cause liver failure  Note: Some of the side effects above are very rare. If you have concerns and/or questions, please discuss them with your medical team.  Important Information . This drug may be present in the saliva, tears, sweat, urine, stool, vomit, semen, and  vaginal secretions. Talk to your doctor and/or your nurse about the necessary precautions to take during this time.  Treating Side Effects . Manage tiredness by pacing your activities for the day. . Be sure to include periods of rest between energy-draining activities. . To decrease the risk of infection, wash your hands regularly. . Avoid close contact with people who have a cold, the flu, or other infections. . Take your temperature as your doctor or nurse tells you, and whenever you feel like you may have a fever. . To help decrease the risk of bleeding, use a soft toothbrush. Check with your nurse before using dental floss. . Be very careful when using knives or tools. . Use an electric shaver instead of a razor. . Drink plenty of fluids (a minimum of eight glasses per day is recommended). . If you throw up or have loose bowel movements, you should drink more fluids so that you do not become dehydrated (lack of water in the body from losing too much fluid). . To help with nausea and vomiting, eat small, frequent meals instead of three large meals a day. Choose foods and drinks that are at room temperature. Ask your nurse or doctor about other helpful tips and medicine that is available to help stop or lessen these symptoms. . If you have numbness and tingling in your hands and feet, be careful when cooking, walking, and handling sharp objects and hot liquids. Marland Kitchen Keeping your pain under control is important to your well-being. Please tell your doctor or nurse if you are experiencing pain.  Food and Drug Interactions . There are no known interactions of carboplatin with food. . This drug may interact with other medicines. Tell your doctor and pharmacist about all the prescription and over-the-counter medicines and dietary supplements (vitamins, minerals, herbs and others) that you are taking at this time. Also, check with your doctor or pharmacist before starting any new prescription  or over-the-counter medicines, or dietary supplements to make sure that there are no interactions.  When to Call the Doctor Call your doctor or nurse if you have any of these symptoms and/or any new or unusual symptoms: . Fever of 100.4 F (38 C) or higher . Chills . Tiredness that interferes with your daily activities . Feeling dizzy or lightheaded . Easy bleeding or bruising . Nausea that stops you from eating or drinking and/or is not relieved by prescribed medicines . Throwing up more than 3 times a day . Blurred vision or other changes in eyesight . Decrease in hearing or ringing in the ear . Signs of allergic reaction: swelling of the face, feeling like your tongue or throat are swelling, trouble breathing, rash, itching, fever, chills, feeling dizzy, and/or feeling that your heart is beating in a fast or not normal way. If this happens, call 911 for emergency care. . While you are getting this drug, please tell your nurse right away if you have any pain, redness, or swelling at the site of the IV infusion . Signs of possible liver problems: dark urine, pale bowel movements, bad stomach pain, feeling very tired and weak, unusual itching,  or yellowing of the eyes or skin . Decreased urine, or very dark urine . Numbness, tingling, or pain in your hands and feet . Pain that does not go away or is not relieved by prescribed medicine . If you think you may be pregnant  Reproduction Warnings . Pregnancy warning: This drug may have harmful effects on the unborn baby. Women of child bearing potential should use effective methods of birth control during your cancer treatment. Let your doctor know right away if you think you may be pregnant. . Breastfeeding warning: It is not known if this drug passes into breast milk. For this reason, women should not breastfeed during treatment because this drug could enter the breast milk and cause harm to a breastfeeding baby. . Fertility warning:  Human fertility studies have not been done with this drug. Talk with your doctor or nurse if you plan to have children. Ask for information on sperm or egg banking.  Paclitaxel (Taxol)  Paclitaxel is a drug used to treat cancer. It is given in the vein (IV).  This will take 1 hour to infuse.  The first time this drug is given it will take longer to infuse because it is increased slowly to monitor for reactions.  The nurse will be in the room with you for the first 15 minutes.   Possible Side Effects . Hair loss. Hair loss is often temporary, although with certain medicine, hair loss can sometimes be permanent. Hair loss may happen suddenly or gradually. If you lose hair, you may lose it from your head, face, armpits, pubic area, chest, and/or legs. You may also notice your hair getting thin. . Swelling of your legs, ankles and/or feet (edema) . Flushing . Nausea and throwing up (vomiting) . Loose bowel movements (diarrhea) . Bone marrow depression. This is a decrease in the number of white blood cells, red blood cells, and platelets. This may raise your risk of infection, make you tired and weak (fatigue), and raise your risk of bleeding. . Effects on the nerves are called peripheral neuropathy. You may feel numbness, tingling, or pain in your hands and feet. It may be hard for you to button your clothes, open jars, or walk as usual. The effect on the nerves may get worse with more doses of the drug. These effects get better in some people after the drug is stopped but it does not get better in all people. . Changes in your liver function . Bone, joint and muscle pain . Abnormal EKG . Allergic reaction: Allergic reactions, including anaphylaxis are rare but may happen in some patients. Signs of allergic reaction to this drug may be swelling of the face, feeling like your tongue or throat are swelling, trouble breathing, rash, itching, fever, chills, feeling dizzy, and/or feeling that your heart is  beating in a fast or not normal way. If this happens, do not take another dose of this drug. You should get urgent medical treatment. . Infection . Changes in your kidney function.  Note: Each of the side effects above was reported in 20% or greater of patients treated with paclitaxel. Not all possible side effects are included above.  Warnings and Precautions . Severe bone marrow depression  Side Effects . To help with hair loss, wash with a mild shampoo and avoid washing your hair every day. . Avoid rubbing your scalp, instead, pat your hair or scalp dry . Avoid coloring your hair . Limit your use of hair spray, electric curlers, blow  dryers, and curling irons. . If you are interested in getting a wig, talk to your nurse. You can also call the [facility] at 800-ACS-2345 to find out information about the "Look Good, Feel Better" program close to where you live. It is a free program where women getting chemotherapy can learn about wigs, turbans and scarves as well as makeup techniques and skin and nail care. . Ask your doctor or nurse about medicines that are available to help stop or lessen diarrhea and/or nausea. . To help with nausea and vomiting, eat small, frequent meals instead of three large meals a day. Choose foods and drinks that are at room temperature. Ask your nurse or doctor about other helpful tips and medicine that is available to help or stop lessen these symptoms. . If you get diarrhea, eat low-fiber foods that are high in protein and calories and avoid foods that can irritate your digestive tracts or lead to cramping. Ask your nurse or doctor about medicine that can lessen or stop your diarrhea. . Mouth care is very important. Your mouth care should consist of routine, gentle cleaning of your teeth or dentures and rinsing your mouth with a mixture of 1/2 teaspoon of salt in 8 ounces of water or  teaspoon of baking soda in 8 ounces of water. This should be done at  least after each meal and at bedtime. . If you have mouth sores, avoid mouthwash that has alcohol. Also avoid alcohol and smoking because they can bother your mouth and throat. . Drink plenty of fluids (a minimum of eight glasses per day is recommended). . Take your temperature as your doctor or nurse tells you, and whenever you feel like you may have a fever. . Talk to your doctor or nurse about precautions you can take to avoid infections and bleeding. . Be careful when cooking, walking, and handling sharp objects and hot liquids.  Food and Drug Interactions . There are no known interactions of paclitaxel with food. . This drug may interact with other medicines. Tell your doctor and pharmacist about all the medicines and dietary supplements (vitamins, minerals, herbs and others) that you are taking at this time. . The safety and use of dietary supplements and alternative diets are often not known. Using these might affect your cancer or interfere with your treatment. Until more is known, you should not use dietary supplements or alternative diets without your cancer doctor's help.  When to Call the Doctor Call your doctor or nurse if you have any of the following symptoms and/or any new or unusual symptoms: . Fever of 100.5 F (38 C) or above . Chills . Redness, pain, warmth, or swelling at the IV site during the infusion . Signs of allergic reaction: swelling of the face, feeling like your tongue or throat are swelling, trouble breathing, rash, itching, fever, chills, feeling dizzy, and/or feeling that your heart is beating in a fast or not normal way . Feeling that your heart is beating in a fast or not normal way (palpitations) . Weight gain of 5 pounds in one week (fluid retention) . Decreased urine or very dark urine . Signs of liver problems: dark urine, pale bowel movements, bad stomach pain, feeling very tired and weak, unusual itching, or yellowing of the eyes or skin . Heavy  menstrual period that lasts longer than normal . Easy bruising or bleeding . Nausea that stops you from eating or drinking, and/or that is not relieved by prescribed medicines. Marland Kitchen  Loose bowel movements (diarrhea) more than 4 times a day or diarrhea with weakness or lightheadedness . Pain in your mouth or throat that makes it hard to eat or drink . Lasting loss of appetite or rapid weight loss of five pounds in a week . Signs of peripheral neuropathy: numbness, tingling, or decreased feeling in fingers or toes; trouble walking or changes in the way you walk; or feeling clumsy when buttoning clothes, opening jars, or other routine activities . Joint and muscle pain that is not relieved by prescribed medicines . Extreme fatigue that interferes with normal activities . While you are getting this drug, please tell your nurse right away if you have any pain, redness, or swelling at the site of the IV infusion. . If you think you are pregnant.  Reproduction Warnings . Pregnancy warning: This drug may have harmful effects on the unborn child, it is recommended that effective methods of birth control should be used during your cancer treatment. Let your doctor know right away if you think you may be pregnant. . Breast feeding warning: Women should not breast feed during treatment because this drug could enter the breast milk and cause harm to a breast feeding baby.  SELF CARE ACTIVITIES WHILE ON CHEMOTHERAPY:  Hydration Increase your fluid intake 48 hours prior to treatment and drink at least 8 to 12 cups (64 ounces) of water/decaffeinated beverages per day after treatment. You can still have your cup of coffee or soda but these beverages do not count as part of your 8 to 12 cups that you need to drink daily. No alcohol intake.  Medications Continue taking your normal prescription medication as prescribed.  If you start any new herbal or new supplements please let us know first to make sure it is  safe.  Mouth Care Have teeth cleaned professionally before starting treatment. Keep dentures and partial plates clean. Use soft toothbrush and do not use mouthwashes that contain alcohol. Biotene is a good mouthwash that is available at most pharmacies or may be ordered by calling [phone]. Use warm salt water gargles (1 teaspoon salt per 1 quart warm water) before and after meals and at bedtime. Or you may rinse with 2 tablespoons of three-percent hydrogen peroxide mixed in eight ounces of water. If you are still having problems with your mouth or sores in your mouth please call the clinic. If you need dental work, please let the doctor know before you go for your appointment so that we can coordinate the best possible time for you in regards to your chemo regimen. You need to also let your dentist know that you are actively taking chemo. We may need to do labs prior to your dental appointment.  Skin Care Always use sunscreen that has not expired and with SPF (Sun Protection Factor) of 50 or higher. Wear hats to protect your head from the sun. Remember to use sunscreen on your hands, ears, face, & feet.  Use good moisturizing lotions such as udder cream, eucerin, or even Vaseline. Some chemotherapies can cause dry skin, color changes in your skin and nails.    . Avoid long, hot showers or baths. . Use gentle, fragrance-free soaps and laundry detergent. . Use moisturizers, preferably creams or ointments rather than lotions because the thicker consistency is better at preventing skin dehydration. Apply the cream or ointment within 15 minutes of showering. Reapply moisturizer at night, and moisturize your hands every time after you wash them.  Hair Loss (if your  doctor says your hair will fall out)  . If your doctor says that your hair is likely to fall out, decide before you begin chemo whether you want to wear a wig. You may want to shop before treatment to match your hair color. . Hats,  turbans, and scarves can also camouflage hair loss, although some people prefer to leave their heads uncovered. If you go bare-headed outdoors, be sure to use sunscreen on your scalp. . Cut your hair short. It eases the inconvenience of shedding lots of hair, but it also can reduce the emotional impact of watching your hair fall out. . Don't perm or color your hair during chemotherapy. Those chemical treatments are already damaging to hair and can enhance hair loss. Once your chemo treatments are done and your hair has grown back, it's OK to resume dyeing or perming hair. With chemotherapy, hair loss is almost always temporary. But when it grows back, it may be a different color or texture. In older adults who still had hair color before chemotherapy, the new growth may be completely gray.  Often, new hair is very fine and soft.  Infection Prevention Please wash your hands for at least 30 seconds using warm soapy water. Handwashing is the #1 way to prevent the spread of germs. Stay away from sick people or people who are getting over a cold. If you develop respiratory systems such as green/yellow mucus production or productive cough or persistent cough let us know and we will see if you need an antibiotic. It is a good idea to keep a pair of gloves on when going into grocery stores/[employer] to decrease your risk of coming into contact with germs on the carts, etc. Carry alcohol hand gel with you at all times and use it frequently if out in public. If your temperature reaches 100.5 or higher please call the clinic and let us know.  If it is after hours or on the weekend please go to the ER if your temperature is over 100.5.  Please have your own personal thermometer at home to use.    Sex and bodily fluids If you are going to have sex, a condom must be used to protect the person that isn't taking chemotherapy. Chemo can decrease your libido (sex drive). For a few days after chemotherapy, chemotherapy can be  excreted through your bodily fluids.  When using the toilet please close the lid and flush the toilet twice.  Do this for a few day after you have had chemotherapy.   Effects of chemotherapy on your sex life Some changes are simple and won't last long. They won't affect your sex life permanently. Sometimes you may feel: . too tired . not strong enough to be very active . sick or sore  . not in the mood . anxious or low Your anxiety might not seem related to sex. For example, you may be worried about the cancer and how your treatment is going. Or you may be worried about money, or about how you family are coping with your illness. These things can cause stress, which can affect your interest in sex. It's important to talk to your partner about how you feel. Remember - the changes to your sex life don't usually last long. There's usually no medical reason to stop having sex during chemo. The drugs won't have any long term physical effects on your performance or enjoyment of sex. Cancer can't be passed on to your partner during sex  Contraception  It's important to use reliable contraception during treatment. Avoid getting pregnant while you or your partner are having chemotherapy. This is because the drugs may harm the baby. Sometimes chemotherapy drugs can leave a man or woman infertile.  This means you would not be able to have children in the future. You might want to talk to someone about permanent infertility. It can be very difficult to learn that you may no longer be able to have children. Some people find counselling helpful. There might be ways to preserve your fertility, although this is easier for men than for women. You may want to speak to a fertility expert. You can talk about sperm banking or harvesting your eggs. You can also ask about other fertility options, such as donor eggs. If you have or have had breast cancer, your doctor might advise you not to take the contraceptive pill. This  is because the hormones in it might affect the cancer.  It is not known for sure whether or not chemotherapy drugs can be passed on through semen or secretions from the vagina. Because of this some doctors advise people to use a barrier method if you have sex during treatment. This applies to vaginal, anal or oral sex. Generally, doctors advise a barrier method only for the time you are actually having the treatment and for about a week after your treatment. Advice like this can be worrying, but this does not mean that you have to avoid being intimate with your partner. You can still have close contact with your partner and continue to enjoy sex.  Animals If you have cats or birds we just ask that you not change the litter or change the cage.  Please have someone else do this for you while you are on chemotherapy.   Food Safety During and After Cancer Treatment Food safety is important for people both during and after cancer treatment. Cancer and cancer treatments, such as chemotherapy, radiation therapy, and stem cell/bone marrow transplantation, often weaken the immune system. This makes it harder for your body to protect itself from foodborne illness, also called food poisoning. Foodborne illness is caused by eating food that contains harmful bacteria, parasites, or viruses.  Foods to avoid Some foods have a higher risk of becoming tainted with bacteria. These include: Marland Kitchen Unwashed fresh fruit and vegetables, especially leafy vegetables that can hide dirt and other contaminants . Raw sprouts, such as alfalfa sprouts . Raw or undercooked beef, especially ground beef, or other raw or undercooked meat and poultry . Fatty, fried, or spicy foods immediately before or after treatment.  These can sit heavy on your stomach and make you feel nauseous. . Raw or undercooked shellfish, such as oysters. . Sushi and sashimi, which often contain raw fish.  . Unpasteurized beverages, such as unpasteurized fruit  juices, raw milk, raw yogurt, or cider . Undercooked eggs, such as soft boiled, over easy, and poached; raw, unpasteurized eggs; or foods made with raw egg, such as homemade raw cookie dough and homemade mayonnaise Simple steps for food safety Shop smart. . Do not buy food stored or displayed in an unclean area. . Do not buy bruised or damaged fruits or vegetables. . Do not buy cans that have cracks, dents, or bulges. . Pick up foods that can spoil at the end of your shopping trip and store them in a cooler on the way home. Prepare and clean up foods carefully. . Rinse all fresh fruits and vegetables under running water, and  dry them with a clean towel or paper towel. . Clean the top of cans before opening them. . After preparing food, wash your hands for 20 seconds with hot water and soap. Pay special attention to areas between fingers and under nails. . Clean your utensils and dishes with hot water and soap. Marland Kitchen Disinfect your kitchen and cutting boards using 1 teaspoon of liquid, unscented bleach mixed into 1 quart of water.   Dispose of old food. . Eat canned and packaged food before its expiration date (the "use by" or "best before" date). . Consume refrigerated leftovers within 3 to 4 days. After that time, throw out the food. Even if the food does not smell or look spoiled, it still may be unsafe. Some bacteria, such as Listeria, can grow even on foods stored in the refrigerator if they are kept for too long. Take precautions when eating out. . At restaurants, avoid buffets and salad bars where food sits out for a long time and comes in contact with many people. Food can become contaminated when someone with a virus, often a norovirus, or another "bug" handles it. . Put any leftover food in a "to-go" container yourself, rather than having the server do it. And, refrigerate leftovers as soon as you get home. . Choose restaurants that are clean and that are willing to prepare your food as you  order it cooked.   MEDICATIONS:                                                                                                                                                                Compazine/Prochlorperazine 10mg  tablet. Take 1 tablet every 6 hours as needed for nausea/vomiting. (This can make you sleepy)   EMLA cream. Apply a quarter size amount to port site 1 hour prior to chemo. Do not rub in. Cover with plastic wrap.   Over-the-Counter Meds:  Colace - 100 mg capsules - take 2 capsules daily.  If this doesn't help then you can increase to 2 capsules twice daily.  Call us if this does not help your bowels move.   Imodium 2mg  capsule. Take 2 capsules after the 1st loose stool and then 1 capsule every 2 hours until you go a total of 12 hours without having a loose stool. Call the [facility] if loose stools continue. If diarrhea occurs at bedtime, take 2 capsules at bedtime. Then take 2 capsules every 4 hours until morning. Call [facility].    Diarrhea Sheet   If you are having loose stools/diarrhea, please purchase Imodium and begin taking as outlined:  At the first sign of poorly formed or loose stools you should begin taking Imodium (loperamide) 2 mg capsules.  Take two caplets (4mg ) followed by one caplet (2mg ) every  2 hours until you have had no diarrhea for 12 hours.  During the night take two caplets (4mg ) at bedtime and continue every 4 hours during the night until the morning.  Stop taking Imodium only after there is no sign of diarrhea for 12 hours.    Always call the [facility] if you are having loose stools/diarrhea that you can't get under control.  Loose stools/diarrhea leads to dehydration (loss of water) in your body.  We have other options of trying to get the loose stools/diarrhea to stop but you must let us know!   Constipation Sheet  Colace - 100 mg capsules - take 2 capsules daily.  If this doesn't help then you can increase to 2 capsules twice  daily.  Please call if the above does not work for you.   Do not go more than 2 days without a bowel movement.  It is very important that you do not become constipated.  It will make you feel sick to your stomach (nausea) and can cause abdominal pain and vomiting.   Nausea Sheet   Compazine/Prochlorperazine 10mg  tablet. Take 1 tablet every 6 hours as needed for nausea/vomiting. (This can make you sleepy)  If you are having persistent nausea (nausea that does not stop) please call the [facility] and let us know the amount of nausea that you are experiencing.  If you begin to vomit, you need to call the [facility] and if it is the weekend and you have vomited more than one time and can't get it to stop-go to the Emergency Room.  Persistent nausea/vomiting can lead to dehydration (loss of fluid in your body) and will make you feel terrible.   Ice chips, sips of clear liquids, foods that are @ room temperature, crackers, and toast tend to be better tolerated.   SYMPTOMS TO REPORT AS SOON AS POSSIBLE AFTER TREATMENT:   FEVER GREATER THAN 100.5 F  CHILLS WITH OR WITHOUT FEVER  NAUSEA AND VOMITING THAT IS NOT CONTROLLED WITH YOUR NAUSEA MEDICATION  UNUSUAL SHORTNESS OF BREATH  UNUSUAL BRUISING OR BLEEDING  TENDERNESS IN MOUTH AND THROAT WITH OR WITHOUT PRESENCE OF ULCERS  URINARY PROBLEMS  BOWEL PROBLEMS  UNUSUAL RASH      Wear comfortable clothing and clothing appropriate for easy access to any Portacath or PICC line. Let us know if there is anything that we can do to make your therapy better!    What to do if you need assistance after hours or on the weekends: CALL [phone].  HOLD on the line, do not hang up.  You will hear multiple messages but at the end you will be connected with a nurse triage line.  They will contact the doctor if necessary.  Most of the time they will be able to assist you.  Do not call the hospital operator.      I have been informed and  understand all of the instructions given to me and have received a copy. I have been instructed to call the clinic [phone] or my family physician as soon as possible for continued medical care, if indicated. I do not have any more questions at this time but understand that I may call the [facility] or the Patient Navigator at [phone] during office hours should I have questions or need assistance in obtaining follow-up care.

## 2018-03-29 NOTE — Progress Notes (Signed)
Chemotherapy teaching packet pulled together.

## 2018-04-01 ENCOUNTER — Telehealth (HOSPITAL_COMMUNITY): Payer: Self-pay | Admitting: *Deleted

## 2018-04-01 ENCOUNTER — Other Ambulatory Visit: Payer: Self-pay | Admitting: General Surgery

## 2018-04-01 ENCOUNTER — Other Ambulatory Visit (HOSPITAL_COMMUNITY): Payer: Self-pay | Admitting: Nurse Practitioner

## 2018-04-01 DIAGNOSIS — C3431 Malignant neoplasm of lower lobe, right bronchus or lung: Secondary | ICD-10-CM

## 2018-04-01 MED ORDER — HYDROCODONE-ACETAMINOPHEN 5-325 MG PO TABS: 1.0000 | ORAL_TABLET | ORAL | 0 refills | Status: DC | PRN

## 2018-04-04 ENCOUNTER — Other Ambulatory Visit (HOSPITAL_COMMUNITY): Payer: Self-pay | Admitting: *Deleted

## 2018-04-07 ENCOUNTER — Inpatient Hospital Stay (HOSPITAL_COMMUNITY): Payer: 59

## 2018-04-07 ENCOUNTER — Other Ambulatory Visit: Payer: Self-pay

## 2018-04-07 ENCOUNTER — Encounter (HOSPITAL_COMMUNITY): Payer: Self-pay

## 2018-04-07 ENCOUNTER — Other Ambulatory Visit (HOSPITAL_COMMUNITY): Payer: Self-pay | Admitting: Nurse Practitioner

## 2018-04-07 VITALS — BP 91/51 | HR 73 | Temp 98.7°F | Resp 16 | Wt 141.4 lb

## 2018-04-07 DIAGNOSIS — Z5111 Encounter for antineoplastic chemotherapy: Secondary | ICD-10-CM | POA: Diagnosis not present

## 2018-04-07 DIAGNOSIS — C3431 Malignant neoplasm of lower lobe, right bronchus or lung: Secondary | ICD-10-CM

## 2018-04-07 LAB — CBC WITH DIFFERENTIAL/PLATELET
Abs Immature Granulocytes: 0.05 10*3/uL (ref 0.00–0.07)
Basophils Absolute: 0 10*3/uL (ref 0.0–0.1)
Basophils Relative: 0 %
EOS PCT: 2 %
Eosinophils Absolute: 0.2 10*3/uL (ref 0.0–0.5)
HEMATOCRIT: 49.1 % — AB (ref 36.0–46.0)
HEMOGLOBIN: 16.1 g/dL — AB (ref 12.0–15.0)
Immature Granulocytes: 0 %
Lymphocytes Relative: 26 %
Lymphs Abs: 3.3 10*3/uL (ref 0.7–4.0)
MCH: 30.4 pg (ref 26.0–34.0)
MCHC: 32.8 g/dL (ref 30.0–36.0)
MCV: 92.6 fL (ref 80.0–100.0)
Monocytes Absolute: 0.8 10*3/uL (ref 0.1–1.0)
Monocytes Relative: 7 %
Neutro Abs: 8.2 10*3/uL — ABNORMAL HIGH (ref 1.7–7.7)
Neutrophils Relative %: 65 %
Platelets: 258 10*3/uL (ref 150–400)
RBC: 5.3 MIL/uL — ABNORMAL HIGH (ref 3.87–5.11)
RDW: 12.5 % (ref 11.5–15.5)
WBC: 12.6 10*3/uL — ABNORMAL HIGH (ref 4.0–10.5)
nRBC: 0 % (ref 0.0–0.2)

## 2018-04-07 LAB — COMPREHENSIVE METABOLIC PANEL
ALT: 17 U/L (ref 0–44)
AST: 24 U/L (ref 15–41)
Albumin: 3.7 g/dL (ref 3.5–5.0)
Alkaline Phosphatase: 86 U/L (ref 38–126)
Anion gap: 7 (ref 5–15)
BUN: 11 mg/dL (ref 6–20)
CO2: 22 mmol/L (ref 22–32)
Calcium: 8.7 mg/dL — ABNORMAL LOW (ref 8.9–10.3)
Chloride: 106 mmol/L (ref 98–111)
Creatinine, Ser: 0.5 mg/dL (ref 0.44–1.00)
GFR calc Af Amer: >60 mL/min (ref >60)
GFR calc non Af Amer: >60 mL/min (ref >60)
Glucose, Bld: 126 mg/dL — ABNORMAL HIGH (ref 70–99)
POTASSIUM: 3.6 mmol/L (ref 3.5–5.1)
Sodium: 135 mmol/L (ref 135–145)
Total Bilirubin: 0.3 mg/dL (ref 0.3–1.2)
Total Protein: 6.7 g/dL (ref 6.5–8.1)

## 2018-04-07 MED ORDER — HYDROCODONE-ACETAMINOPHEN 5-325 MG PO TABS: 1.0000 | ORAL_TABLET | ORAL | 0 refills | Status: DC | PRN

## 2018-04-07 MED ORDER — SODIUM CHLORIDE 0.9% FLUSH
10.0000 mL | INTRAVENOUS | Status: DC | PRN
  Administered 2018-04-07: 10 mL
  Filled 2018-04-07: qty 10

## 2018-04-07 MED ORDER — HYDROCODONE-ACETAMINOPHEN 5-325 MG PO TABS
ORAL_TABLET | ORAL | Status: AC
  Filled 2018-04-07: qty 1

## 2018-04-07 MED ORDER — FAMOTIDINE IN NACL 20-0.9 MG/50ML-% IV SOLN
20.0000 mg | Freq: Once | INTRAVENOUS | Status: AC
  Administered 2018-04-07: 20 mg via INTRAVENOUS

## 2018-04-07 MED ORDER — SODIUM CHLORIDE 0.9 % IV SOLN
Freq: Once | INTRAVENOUS | Status: AC
  Administered 2018-04-07: 10:00:00 via INTRAVENOUS

## 2018-04-07 MED ORDER — HEPARIN SOD (PORK) LOCK FLUSH 100 UNIT/ML IV SOLN
500.0000 [IU] | Freq: Once | INTRAVENOUS | Status: AC | PRN
  Administered 2018-04-07: 500 [IU]

## 2018-04-07 MED ORDER — DIPHENHYDRAMINE HCL 50 MG/ML IJ SOLN
50.0000 mg | Freq: Once | INTRAMUSCULAR | Status: AC
  Administered 2018-04-07: 50 mg via INTRAVENOUS

## 2018-04-07 MED ORDER — SODIUM CHLORIDE 0.9 % IV SOLN
20.0000 mg | Freq: Once | INTRAVENOUS | Status: AC
  Administered 2018-04-07: 20 mg via INTRAVENOUS
  Filled 2018-04-07: qty 2

## 2018-04-07 MED ORDER — FAMOTIDINE IN NACL 20-0.9 MG/50ML-% IV SOLN
INTRAVENOUS | Status: AC
  Filled 2018-04-07: qty 50

## 2018-04-07 MED ORDER — SODIUM CHLORIDE 0.9 % IV SOLN
45.0000 mg/m2 | Freq: Once | INTRAVENOUS | Status: AC
  Administered 2018-04-07: 78 mg via INTRAVENOUS
  Filled 2018-04-07: qty 13

## 2018-04-07 MED ORDER — PALONOSETRON HCL INJECTION 0.25 MG/5ML
0.2500 mg | Freq: Once | INTRAVENOUS | Status: AC
  Administered 2018-04-07: 0.25 mg via INTRAVENOUS

## 2018-04-07 MED ORDER — PALONOSETRON HCL INJECTION 0.25 MG/5ML
INTRAVENOUS | Status: AC
  Filled 2018-04-07: qty 5

## 2018-04-07 MED ORDER — SODIUM CHLORIDE 0.9 % IV SOLN
209.4000 mg | Freq: Once | INTRAVENOUS | Status: AC
  Administered 2018-04-07: 210 mg via INTRAVENOUS
  Filled 2018-04-07: qty 21

## 2018-04-07 MED ORDER — HYDROCODONE-ACETAMINOPHEN 5-325 MG PO TABS
1.0000 | ORAL_TABLET | Freq: Once | ORAL | Status: AC
  Administered 2018-04-07: 1 via ORAL

## 2018-04-07 MED ORDER — DIPHENHYDRAMINE HCL 50 MG/ML IJ SOLN
INTRAMUSCULAR | Status: AC
  Filled 2018-04-07: qty 1

## 2018-04-07 NOTE — Progress Notes (Signed)
Informed MD that patient has a nonproductive cough at this time, no different than at previous visit with MD. Proceed with treatment today.   Taxol finished at 1313.

## 2018-04-07 NOTE — Patient Instructions (Signed)
[facility] Discharge Instructions for Patients Receiving Chemotherapy   Beginning January 23rd 2017 lab work for the Cancer Center will be done in the  Main lab at [facility] on 1st floor. If you have a lab appointment with the Cancer Center please come in thru the  Main Entrance and check in at the main information desk   Today you received the following chemotherapy agents   To help prevent nausea and vomiting after your treatment, we encourage you to take your nausea medication     If you develop nausea and vomiting, or diarrhea that is not controlled by your medication, call the clinic.  The clinic phone number is [phone]. Office hours are Monday-Friday 8:30am-5:00pm.  BELOW ARE SYMPTOMS THAT SHOULD BE REPORTED IMMEDIATELY:  *FEVER GREATER THAN 101.0 F  *CHILLS WITH OR WITHOUT FEVER  NAUSEA AND VOMITING THAT IS NOT CONTROLLED WITH YOUR NAUSEA MEDICATION  *UNUSUAL SHORTNESS OF BREATH  *UNUSUAL BRUISING OR BLEEDING  TENDERNESS IN MOUTH AND THROAT WITH OR WITHOUT PRESENCE OF ULCERS  *URINARY PROBLEMS  *BOWEL PROBLEMS  UNUSUAL RASH Items with * indicate a potential emergency and should be followed up as soon as possible. If you have an emergency after office hours please contact your primary care physician or go to the nearest emergency department.  Please call the clinic during office hours if you have any questions or concerns.   You may also contact the Patient Navigator at [phone] should you have any questions or need assistance in obtaining follow up care.      Resources For Cancer Patients and their Caregivers ? American Cancer Society: Can assist with transportation, wigs, general needs, runs Look Good Feel Better.        1-888-227-6333 ? Cancer Care: Provides financial assistance, online support groups, medication/co-pay assistance.  1-800-813-HOPE (4673) ? [facility] Assists [place] cancer  patients and their families through emotional , educational and financial support.  336-427-4357 ? [place] DSS Where to apply for food stamps, Medicaid and utility assistance. 336-342-1394 ? [facility]: Transportation to medical appointments. 336-347-2287 ? Social Security Administration: May apply for disability if have a Stage IV cancer. 336-342-7796 1-800-772-1213 ? [place] Aging, Disability and Transit Services: Assists with nutrition, care and transit needs. 336-349-2343         

## 2018-04-07 NOTE — Progress Notes (Signed)
Pt presents today for day 1 of Taxol and Carboplatin. Radiation therapy today day 1 performed in [place], [place]   prior to visit today. VSS. Pt has complaints of pain in her back and chest that pt describes as an ache. Labs drawn this am and pending at this time. Pt requests a pain pill while here today. MAR reviewed and updated. Chemo care book given, new patient teaching performed. Pt verbalized an understanding. Consent obtained.   Labs reviewed with Dr. Delton Coombes. Proceed with treatment per MD. VO received may give Norco 5/325mg  x 1 dose now.

## 2018-04-08 ENCOUNTER — Telehealth (HOSPITAL_COMMUNITY): Payer: Self-pay

## 2018-04-08 NOTE — Telephone Encounter (Signed)
24 hour follow up- patient has no issues to report. She has had a good day, just tired.

## 2018-04-11 ENCOUNTER — Other Ambulatory Visit (HOSPITAL_COMMUNITY): Payer: 59

## 2018-04-11 ENCOUNTER — Other Ambulatory Visit (HOSPITAL_COMMUNITY): Payer: Self-pay | Admitting: *Deleted

## 2018-04-11 ENCOUNTER — Ambulatory Visit (HOSPITAL_COMMUNITY): Payer: 59 | Admitting: Hematology

## 2018-04-11 MED ORDER — ONDANSETRON 8 MG PO TBDP: 8.0000 mg | ORAL_TABLET | Freq: Three times a day (TID) | ORAL | 1 refills | Status: DC | PRN

## 2018-04-11 NOTE — Telephone Encounter (Signed)
Patient is currently in [place] for radiation and the nurse called stating that the patient has lost 3 pounds over the weekend. She has been unable to control her nausea with the compazine.  Rx for zofran called in to pharmacy and [place], Therapist, sports from [facility] notified patient.

## 2018-04-15 ENCOUNTER — Encounter (HOSPITAL_COMMUNITY): Payer: Self-pay

## 2018-04-15 ENCOUNTER — Inpatient Hospital Stay (HOSPITAL_COMMUNITY): Payer: 59

## 2018-04-15 VITALS — BP 89/50 | HR 70 | Temp 98.2°F | Resp 16 | Wt 135.0 lb

## 2018-04-15 DIAGNOSIS — C3431 Malignant neoplasm of lower lobe, right bronchus or lung: Secondary | ICD-10-CM

## 2018-04-15 DIAGNOSIS — Z5111 Encounter for antineoplastic chemotherapy: Secondary | ICD-10-CM | POA: Diagnosis not present

## 2018-04-15 LAB — CBC WITH DIFFERENTIAL/PLATELET
Abs Immature Granulocytes: 0.06 10*3/uL (ref 0.00–0.07)
Basophils Absolute: 0 10*3/uL (ref 0.0–0.1)
Basophils Relative: 0 %
EOS PCT: 1 %
Eosinophils Absolute: 0.1 10*3/uL (ref 0.0–0.5)
HCT: 47.2 % — ABNORMAL HIGH (ref 36.0–46.0)
Hemoglobin: 15.4 g/dL — ABNORMAL HIGH (ref 12.0–15.0)
Immature Granulocytes: 1 %
Lymphocytes Relative: 17 %
Lymphs Abs: 1.7 10*3/uL (ref 0.7–4.0)
MCH: 29.7 pg (ref 26.0–34.0)
MCHC: 32.6 g/dL (ref 30.0–36.0)
MCV: 90.9 fL (ref 80.0–100.0)
MONO ABS: 0.8 10*3/uL (ref 0.1–1.0)
Monocytes Relative: 9 %
Neutro Abs: 7.1 10*3/uL (ref 1.7–7.7)
Neutrophils Relative %: 72 %
Platelets: 224 10*3/uL (ref 150–400)
RBC: 5.19 MIL/uL — ABNORMAL HIGH (ref 3.87–5.11)
RDW: 12.5 % (ref 11.5–15.5)
WBC: 9.8 10*3/uL (ref 4.0–10.5)
nRBC: 0 % (ref 0.0–0.2)

## 2018-04-15 LAB — COMPREHENSIVE METABOLIC PANEL
ALT: 18 U/L (ref 0–44)
AST: 18 U/L (ref 15–41)
Albumin: 3.5 g/dL (ref 3.5–5.0)
Alkaline Phosphatase: 71 U/L (ref 38–126)
Anion gap: 9 (ref 5–15)
BUN: 12 mg/dL (ref 6–20)
CO2: 24 mmol/L (ref 22–32)
Calcium: 9 mg/dL (ref 8.9–10.3)
Chloride: 103 mmol/L (ref 98–111)
Creatinine, Ser: 0.56 mg/dL (ref 0.44–1.00)
GFR calc Af Amer: >60 mL/min (ref >60)
Glucose, Bld: 116 mg/dL — ABNORMAL HIGH (ref 70–99)
Potassium: 3.8 mmol/L (ref 3.5–5.1)
Sodium: 136 mmol/L (ref 135–145)
Total Bilirubin: 0.3 mg/dL (ref 0.3–1.2)
Total Protein: 6.8 g/dL (ref 6.5–8.1)

## 2018-04-15 MED ORDER — SODIUM CHLORIDE 0.9 % IV SOLN
209.4000 mg | Freq: Once | INTRAVENOUS | Status: AC
  Administered 2018-04-15: 210 mg via INTRAVENOUS
  Filled 2018-04-15: qty 21

## 2018-04-15 MED ORDER — PALONOSETRON HCL INJECTION 0.25 MG/5ML
0.2500 mg | Freq: Once | INTRAVENOUS | Status: AC
  Administered 2018-04-15: 0.25 mg via INTRAVENOUS
  Filled 2018-04-15: qty 5

## 2018-04-15 MED ORDER — SODIUM CHLORIDE 0.9 % IV SOLN
20.0000 mg | Freq: Once | INTRAVENOUS | Status: AC
  Administered 2018-04-15: 20 mg via INTRAVENOUS
  Filled 2018-04-15: qty 2

## 2018-04-15 MED ORDER — SODIUM CHLORIDE 0.9% FLUSH
10.0000 mL | INTRAVENOUS | Status: DC | PRN
  Administered 2018-04-15: 10 mL
  Filled 2018-04-15: qty 10

## 2018-04-15 MED ORDER — SODIUM CHLORIDE 0.9 % IV SOLN
45.0000 mg/m2 | Freq: Once | INTRAVENOUS | Status: AC
  Administered 2018-04-15: 78 mg via INTRAVENOUS
  Filled 2018-04-15: qty 13

## 2018-04-15 MED ORDER — FAMOTIDINE IN NACL 20-0.9 MG/50ML-% IV SOLN
20.0000 mg | Freq: Once | INTRAVENOUS | Status: AC
  Administered 2018-04-15: 20 mg via INTRAVENOUS
  Filled 2018-04-15: qty 50

## 2018-04-15 MED ORDER — SODIUM CHLORIDE 0.9 % IV SOLN
Freq: Once | INTRAVENOUS | Status: AC
  Administered 2018-04-15: 09:00:00 via INTRAVENOUS

## 2018-04-15 MED ORDER — DIPHENHYDRAMINE HCL 50 MG/ML IJ SOLN
50.0000 mg | Freq: Once | INTRAMUSCULAR | Status: AC
  Administered 2018-04-15: 50 mg via INTRAVENOUS
  Filled 2018-04-15: qty 1

## 2018-04-15 MED ORDER — HEPARIN SOD (PORK) LOCK FLUSH 100 UNIT/ML IV SOLN
500.0000 [IU] | Freq: Once | INTRAVENOUS | Status: AC | PRN
  Administered 2018-04-15: 500 [IU]
  Filled 2018-04-15: qty 5

## 2018-04-15 NOTE — Progress Notes (Signed)
0910 Labs reviewed with Dr. Walden Field and pt approved for chemo tx today per MD                         Sharon Berg tolerated chemo tx well without complaints or incident. VSS upon discharge. Pt discharged self ambulatory in satisfactory condition accompanied by her husband

## 2018-04-15 NOTE — Patient Instructions (Signed)
[facility] Discharge Instructions for Patients Receiving Chemotherapy   Beginning January 23rd 2017 lab work for the Cancer Center will be done in the  Main lab at [facility] on 1st floor. If you have a lab appointment with the Cancer Center please come in thru the  Main Entrance and check in at the main information desk   Today you received the following chemotherapy agents Taxol and Carboplatin. Follow-up as scheduled. Call clinic for any questions or concerns  To help prevent nausea and vomiting after your treatment, we encourage you to take your nausea medication.   If you develop nausea and vomiting, or diarrhea that is not controlled by your medication, call the clinic.  The clinic phone number is [phone]. Office hours are Monday-Friday 8:30am-5:00pm.  BELOW ARE SYMPTOMS THAT SHOULD BE REPORTED IMMEDIATELY:  *FEVER GREATER THAN 101.0 F  *CHILLS WITH OR WITHOUT FEVER  NAUSEA AND VOMITING THAT IS NOT CONTROLLED WITH YOUR NAUSEA MEDICATION  *UNUSUAL SHORTNESS OF BREATH  *UNUSUAL BRUISING OR BLEEDING  TENDERNESS IN MOUTH AND THROAT WITH OR WITHOUT PRESENCE OF ULCERS  *URINARY PROBLEMS  *BOWEL PROBLEMS  UNUSUAL RASH Items with * indicate a potential emergency and should be followed up as soon as possible. If you have an emergency after office hours please contact your primary care physician or go to the nearest emergency department.  Please call the clinic during office hours if you have any questions or concerns.   You may also contact the Patient Navigator at [phone] should you have any questions or need assistance in obtaining follow up care.      Resources For Cancer Patients and their Caregivers ? American Cancer Society: Can assist with transportation, wigs, general needs, runs Look Good Feel Better.        1-888-227-6333 ? Cancer Care: Provides financial assistance, online support groups, medication/co-pay assistance.   1-800-813-HOPE (4673) ? [facility] Assists [place] cancer patients and their families through emotional , educational and financial support.  336-427-4357 ? [place] DSS Where to apply for food stamps, Medicaid and utility assistance. 336-342-1394 ? RCATS: Transportation to medical appointments. 336-347-2287 ? Social Security Administration: May apply for disability if have a Stage IV cancer. 336-342-7796 1-800-772-1213 ? [place] Aging, Disability and Transit Services: Assists with nutrition, care and transit needs. 336-349-2343         

## 2018-04-22 ENCOUNTER — Inpatient Hospital Stay (HOSPITAL_COMMUNITY): Payer: 59

## 2018-04-22 ENCOUNTER — Inpatient Hospital Stay (HOSPITAL_COMMUNITY): Payer: 59 | Attending: Hematology

## 2018-04-22 ENCOUNTER — Other Ambulatory Visit: Payer: Self-pay

## 2018-04-22 ENCOUNTER — Encounter (HOSPITAL_COMMUNITY): Payer: Self-pay

## 2018-04-22 ENCOUNTER — Inpatient Hospital Stay (HOSPITAL_BASED_OUTPATIENT_CLINIC_OR_DEPARTMENT_OTHER): Payer: 59 | Admitting: Hematology

## 2018-04-22 ENCOUNTER — Encounter (HOSPITAL_COMMUNITY): Payer: Self-pay | Admitting: Hematology

## 2018-04-22 VITALS — BP 98/45 | HR 86 | Temp 97.8°F | Resp 17 | Wt 132.4 lb

## 2018-04-22 DIAGNOSIS — R0789 Other chest pain: Secondary | ICD-10-CM

## 2018-04-22 DIAGNOSIS — R5383 Other fatigue: Secondary | ICD-10-CM

## 2018-04-22 DIAGNOSIS — A419 Sepsis, unspecified organism: Secondary | ICD-10-CM | POA: Diagnosis not present

## 2018-04-22 DIAGNOSIS — C3431 Malignant neoplasm of lower lobe, right bronchus or lung: Secondary | ICD-10-CM

## 2018-04-22 DIAGNOSIS — F329 Major depressive disorder, single episode, unspecified: Secondary | ICD-10-CM

## 2018-04-22 DIAGNOSIS — F419 Anxiety disorder, unspecified: Secondary | ICD-10-CM | POA: Diagnosis not present

## 2018-04-22 DIAGNOSIS — F17210 Nicotine dependence, cigarettes, uncomplicated: Secondary | ICD-10-CM

## 2018-04-22 DIAGNOSIS — N390 Urinary tract infection, site not specified: Secondary | ICD-10-CM | POA: Diagnosis not present

## 2018-04-22 LAB — COMPREHENSIVE METABOLIC PANEL
ALT: 21 U/L (ref 0–44)
AST: 20 U/L (ref 15–41)
Albumin: 3.7 g/dL (ref 3.5–5.0)
Alkaline Phosphatase: 87 U/L (ref 38–126)
Anion gap: 9 (ref 5–15)
BUN: 10 mg/dL (ref 6–20)
CO2: 25 mmol/L (ref 22–32)
Calcium: 9.3 mg/dL (ref 8.9–10.3)
Chloride: 102 mmol/L (ref 98–111)
Creatinine, Ser: 0.61 mg/dL (ref 0.44–1.00)
GFR calc Af Amer: >60 mL/min (ref >60)
GFR calc non Af Amer: >60 mL/min (ref >60)
GLUCOSE: 110 mg/dL — AB (ref 70–99)
Potassium: 4.1 mmol/L (ref 3.5–5.1)
Sodium: 136 mmol/L (ref 135–145)
Total Bilirubin: 0.6 mg/dL (ref 0.3–1.2)
Total Protein: 7 g/dL (ref 6.5–8.1)

## 2018-04-22 LAB — CBC WITH DIFFERENTIAL/PLATELET
Abs Immature Granulocytes: 0.03 10*3/uL (ref 0.00–0.07)
Basophils Absolute: 0 10*3/uL (ref 0.0–0.1)
Basophils Relative: 0 %
EOS PCT: 1 %
Eosinophils Absolute: 0.1 10*3/uL (ref 0.0–0.5)
HCT: 47.6 % — ABNORMAL HIGH (ref 36.0–46.0)
Hemoglobin: 15.3 g/dL — ABNORMAL HIGH (ref 12.0–15.0)
Immature Granulocytes: 1 %
LYMPHS PCT: 15 %
Lymphs Abs: 0.9 10*3/uL (ref 0.7–4.0)
MCH: 29.2 pg (ref 26.0–34.0)
MCHC: 32.1 g/dL (ref 30.0–36.0)
MCV: 90.8 fL (ref 80.0–100.0)
Monocytes Absolute: 0.6 10*3/uL (ref 0.1–1.0)
Monocytes Relative: 9 %
Neutro Abs: 4.7 10*3/uL (ref 1.7–7.7)
Neutrophils Relative %: 74 %
Platelets: 231 10*3/uL (ref 150–400)
RBC: 5.24 MIL/uL — ABNORMAL HIGH (ref 3.87–5.11)
RDW: 12.7 % (ref 11.5–15.5)
WBC: 6.3 10*3/uL (ref 4.0–10.5)
nRBC: 0 % (ref 0.0–0.2)

## 2018-04-22 MED ORDER — SODIUM CHLORIDE 0.9 % IV SOLN
Freq: Once | INTRAVENOUS | Status: AC
  Administered 2018-04-22: 10:00:00 via INTRAVENOUS

## 2018-04-22 MED ORDER — ONDANSETRON 8 MG PO TBDP: 8.0000 mg | ORAL_TABLET | Freq: Three times a day (TID) | ORAL | 1 refills | Status: DC | PRN

## 2018-04-22 MED ORDER — SODIUM CHLORIDE 0.9 % IV SOLN
209.4000 mg | Freq: Once | INTRAVENOUS | Status: AC
  Administered 2018-04-22: 210 mg via INTRAVENOUS
  Filled 2018-04-22: qty 21

## 2018-04-22 MED ORDER — FAMOTIDINE IN NACL 20-0.9 MG/50ML-% IV SOLN
20.0000 mg | Freq: Once | INTRAVENOUS | Status: AC
  Administered 2018-04-22: 20 mg via INTRAVENOUS

## 2018-04-22 MED ORDER — FAMOTIDINE IN NACL 20-0.9 MG/50ML-% IV SOLN
INTRAVENOUS | Status: AC
  Filled 2018-04-22: qty 50

## 2018-04-22 MED ORDER — DIPHENHYDRAMINE HCL 50 MG/ML IJ SOLN
50.0000 mg | Freq: Once | INTRAMUSCULAR | Status: AC
  Administered 2018-04-22: 50 mg via INTRAVENOUS

## 2018-04-22 MED ORDER — HYDROCODONE-ACETAMINOPHEN 5-325 MG PO TABS: 1.0000 | ORAL_TABLET | ORAL | 0 refills | Status: DC | PRN

## 2018-04-22 MED ORDER — DIPHENHYDRAMINE HCL 50 MG/ML IJ SOLN
INTRAMUSCULAR | Status: AC
  Filled 2018-04-22: qty 1

## 2018-04-22 MED ORDER — PALONOSETRON HCL INJECTION 0.25 MG/5ML
0.2500 mg | Freq: Once | INTRAVENOUS | Status: AC
  Administered 2018-04-22: 0.25 mg via INTRAVENOUS

## 2018-04-22 MED ORDER — HEPARIN SOD (PORK) LOCK FLUSH 100 UNIT/ML IV SOLN
500.0000 [IU] | Freq: Once | INTRAVENOUS | Status: AC
  Administered 2018-04-22: 500 [IU] via INTRAVENOUS

## 2018-04-22 MED ORDER — PALONOSETRON HCL INJECTION 0.25 MG/5ML
INTRAVENOUS | Status: AC
  Filled 2018-04-22: qty 5

## 2018-04-22 MED ORDER — SODIUM CHLORIDE 0.9 % IV SOLN
45.0000 mg/m2 | Freq: Once | INTRAVENOUS | Status: AC
  Administered 2018-04-22: 78 mg via INTRAVENOUS
  Filled 2018-04-22: qty 13

## 2018-04-22 MED ORDER — SODIUM CHLORIDE 0.9 % IV SOLN
20.0000 mg | Freq: Once | INTRAVENOUS | Status: AC
  Administered 2018-04-22: 20 mg via INTRAVENOUS
  Filled 2018-04-22: qty 2

## 2018-04-22 NOTE — Patient Instructions (Signed)
[facility] Discharge Instructions     Thank you for choosing [facility] at [facility] to provide your oncology and hematology care.  To afford each patient quality time with our provider, please arrive at least 15 minutes before your scheduled appointment time.   If you have a lab appointment with the Cancer Center please come in thru the  Main Entrance and check in at the main information desk  You need to re-schedule your appointment should you arrive 10 or more minutes late.  We strive to give you quality time with our providers, and arriving late affects you and other patients whose appointments are after yours.  Also, if you no show three or more times for appointments you may be dismissed from the clinic at the providers discretion.     Again, thank you for choosing [facility].  Our hope is that these requests will decrease the amount of time that you wait before being seen by our physicians.       _____________________________________________________________  Should you have questions after your visit to [facility], please contact our office at [phone] between the hours of 8:00 a.m. and 4:30 p.m.  Voicemails left after 4:00 p.m. will not be returned until the following business day.  For prescription refill requests, have your pharmacy contact our office and allow 72 hours.    Cancer Center Support Programs:   > Cancer Support Group  2nd Tuesday of the month 1pm-2pm, Journey Room    

## 2018-04-22 NOTE — Patient Instructions (Signed)
[facility] Discharge Instructions for Patients Receiving Chemotherapy  Today you received the following chemotherapy agents   To help prevent nausea and vomiting after your treatment, we encourage you to take your nausea medication   If you develop nausea and vomiting that is not controlled by your nausea medication, call the clinic.   BELOW ARE SYMPTOMS THAT SHOULD BE REPORTED IMMEDIATELY:  *FEVER GREATER THAN 100.5 F  *CHILLS WITH OR WITHOUT FEVER  NAUSEA AND VOMITING THAT IS NOT CONTROLLED WITH YOUR NAUSEA MEDICATION  *UNUSUAL SHORTNESS OF BREATH  *UNUSUAL BRUISING OR BLEEDING  TENDERNESS IN MOUTH AND THROAT WITH OR WITHOUT PRESENCE OF ULCERS  *URINARY PROBLEMS  *BOWEL PROBLEMS  UNUSUAL RASH Items with * indicate a potential emergency and should be followed up as soon as possible.  Feel free to call the clinic should you have any questions or concerns. The clinic phone number is [phone].  Please show the CHEMO ALERT CARD at check-in to the Emergency Department and triage nurse.   

## 2018-04-22 NOTE — Assessment & Plan Note (Signed)
1.  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 -She went to the ER on 02/23/2018 when a CT of the chest was done which showed necrotic adenopathy within the mediastinum and right hilum.  A central superior segment right lower lobe nodule likely represents the primary. - She has a 57-pack-year smoking history. -She lost about 12 pounds in the last couple of months.  She does have occasional dysphagia to solid foods. - She does have hemoptysis with bloodstained sputum for the past couple of months. - I reviewed the results of the PET CT scan dated 03/11/2018 which shows posterior right mediastinal confluent necrotic adenopathy.  Right lower lobe mass measuring 4.4 x 4.1 cm, intimately associated with the anterior aspect of the T4 vertebral body, without gross osseous destruction.  There is a 9 mm subcarinal lymph node.  No other metastatic disease was seen. -We discussed the results of the MRI of the brain dated 02/26/2018 which was negative for metastatic disease. -She underwent bronch with EBUS biopsy on 03/21/2018. -I reviewed the pathology report which was consistent with adenocarcinoma. -I discussed the normal prognosis and treatment plan of stage IIIb non-small cell lung cancer.  She will need combination chemoradiation therapy.  We discussed the side effects of the chemotherapy regimen. - Weekly carboplatin and paclitaxel along with radiation therapy started on 04/07/2018. -She has tolerated weekly chemotherapy very well.  She did have some constipation.  She was told to take stool softeners 2 tablets daily.  She did have mild lightheadedness occasionally.  Blood pressure today is 104/59. -I reviewed her medications.  She is not on any blood pressure medication.  We will give her 500 mL of normal saline today. - We have reviewed her labs.  They are adequate to proceed with week 3 of treatment today. -We will  reevaluate her in 1 week.  2.  Anterior chest pain: - She is continuing hydrocodone 5 mg every 6 hours as needed for her midsternal pain. -Pain is well controlled with this regimen.

## 2018-04-22 NOTE — Progress Notes (Signed)
[address] [place], [place] [address]   CLINIC:  Medical Oncology/Hematology  PCP:  [name], [place] [address] [phone]   REASON FOR VISIT: Follow-up for adenocarcinoma of the lung.  CURRENT THERAPY: Chemoradiation   BRIEF ONCOLOGIC HISTORY:    Cancer of right lung ([facility])   02/28/2018 Initial Diagnosis    Cancer of right lung ([facility])    03/25/2018 Cancer Staging    Staging form: Lung, AJCC 8th Edition - Clinical stage from 03/25/2018: Stage IIIB (cT4, cN2, cM0) - Signed by Derek [name], MD on 03/25/2018    04/07/2018 -  Chemotherapy    The patient had palonosetron (ALOXI) injection 0.25 mg, 0.25 mg, Intravenous,  Once, 3 of 5 cycles Administration: 0.25 mg (04/07/2018), 0.25 mg (04/15/2018), 0.25 mg (04/22/2018) CARBOplatin (PARAPLATIN) 210 mg in sodium chloride 0.9 % 250 mL chemo infusion, 210 mg (100 % of original dose 209.4 mg), Intravenous,  Once, 3 of 5 cycles Dose modification:   (original dose 209.4 mg, Cycle 1),   (original dose 209.4 mg, Cycle 2), 209.4 mg (original dose 209.4 mg, Cycle 3) Administration: 210 mg (04/07/2018), 210 mg (04/15/2018), 210 mg (04/22/2018) PACLitaxel (TAXOL) 78 mg in sodium chloride 0.9 % 250 mL chemo infusion (</= 80mg /m2), 45 mg/m2 = 78 mg, Intravenous,  Once, 3 of 5 cycles Administration: 78 mg (04/07/2018), 78 mg (04/15/2018), 78 mg (04/22/2018)  for chemotherapy treatment.       CANCER STAGING: Cancer Staging Cancer of right lung [facility]) Staging form: Lung, AJCC 8th Edition - Clinical stage from 03/25/2018: Stage IIIB (cT4, cN2, cM0) - Signed by Derek [name], MD on 03/25/2018    INTERVAL HISTORY:  Ms. Dutkiewicz 53 y.o. female returns for routine follow-up for adenocarcinoma of the lung. She is here today with her daughter. She is doing well and tolerating treatment pretty well. She did have some fatigue and was dizzy at times. She also had problems with  nausea. The medication did help with this. She became constipated after her last treatment due to the pain and nausea medications. She is now taking stool softeners. Denies any diarrhea. Denies any new pains. Had not noticed any recent bleeding such as epistaxis, hematuria or hematochezia. Denies recent chest pain on exertion, shortness of breath on minimal exertion, pre-syncopal episodes, or palpitations. Denies any numbness or tingling in hands or feet. Denies any recent fevers, infections, or recent hospitalizations. She reports her appetite at 50% but she has no energy.     REVIEW OF SYSTEMS:  Review of Systems  Constitutional: Positive for fatigue.  Gastrointestinal: Positive for constipation and nausea.  Neurological: Positive for dizziness and headaches.  All other systems reviewed and are negative.    PAST MEDICAL/SURGICAL HISTORY:  Past Medical History:  Diagnosis Date  . Anxiety   . Depression   . Lung cancer ([facility])   . Migraine   . Pneumonia   . Seizures ([facility])    stressed induced   Past Surgical History:  Procedure Laterality Date  . ABDOMINAL HYSTERECTOMY  2000  . PORTACATH PLACEMENT Left 03/14/2018   Procedure: INSERTION PORT-A-CATH;  Surgeon: Aviva Signs, MD;  Location: [facility];  Service: General;  Laterality: Left;  Marland Kitchen VIDEO BRONCHOSCOPY WITH ENDOBRONCHIAL ULTRASOUND N/A 03/21/2018   Procedure: VIDEO BRONCHOSCOPY WITH ENDOBRONCHIAL ULTRASOUND;  Surgeon: Melrose Nakayama, MD;  Location: [facility];  Service: Thoracic;  Laterality: N/A;     SOCIAL HISTORY:  Social History   Socioeconomic  History  . Marital status: Married    Spouse name: [name]  . Number of children: 1  . Years of education: College  . Highest education level: Not on file  Occupational History  . 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 Financial resource strain: Not hard at all  . Food insecurity:    Worry: Never true    Inability: Never true  .  Transportation needs:    Medical: No    Non-medical: No  Tobacco Use  . Smoking status: Current Every Day Smoker    Packs/day: 0.12    Years: 38.00    Pack years: 4.56    Types: Cigarettes  . Smokeless tobacco: Never Used  . Tobacco comment: .2 cigs  a day   Substance and Sexual Activity  . Alcohol use: No    Alcohol/week: 0.0 standard drinks  . Drug use: No  . Sexual activity: Yes    Birth control/protection: Surgical  Lifestyle  . Physical activity:    Days per week: 0 days    Minutes per session: 0 min  . Stress: Rather much  Relationships  . Social connections:    Talks on phone: More than three times a week    Gets together: Once a week    Attends religious service: Never    Active member of club or organization: No    Attends meetings of clubs or organizations: Never    Relationship status: Married  . Intimate partner violence:    Fear of current or ex partner: No    Emotionally abused: No    Physically abused: No    Forced sexual activity: No  Other Topics Concern  . Not on file  Social History Narrative   Patient is married with one child.   Patient is right handed.   Patient has college education.   Caffeine use: Patient drinks 1 glass of tea daily.    FAMILY HISTORY:  Family History  Problem Relation Age of Onset  . Hypertension Mother   . Diabetes Father   . Heart disease Maternal Grandmother   . Stroke Maternal Grandmother   . Heart disease Maternal Grandfather   . Dementia Paternal Grandmother   . Dementia Paternal Grandfather     CURRENT MEDICATIONS:  Outpatient Encounter Medications as of 04/22/2018  Medication Sig  . ALPRAZolam (XANAX) 1 MG tablet TAKE 1/2 TO 1 TABLET TWICE A DAY FOR ANXIETY (Patient taking differently: Take 1 mg by mouth 2 (two) times daily. TAKE 1/2 TO 1 TABLET TWICE A DAY FOR ANXIETY)  . ALPRAZolam (XANAX) 1 MG tablet TAKE 1/2 TO 1 TABLET TWICE A DAY FOR ANXIETY  . CARBOPLATIN IV Inject into the vein once a week.  Marland Kitchen  HYDROcodone-acetaminophen (NORCO/VICODIN) 5-325 MG tablet Take by mouth.  Marland Kitchen HYDROcodone-acetaminophen (NORCO/VICODIN) 5-325 MG tablet Take 1 tablet by mouth every 4 (four) hours as needed for moderate pain.  Marland Kitchen lidocaine-prilocaine (EMLA) cream Apply small amount to port site one hour prior to appointment. Cover with plastic wrap.  . lidocaine-prilocaine (EMLA) cream Apply small amount to port site one hour prior to appointment. Cover with plastic wrap.  . nicotine (NICODERM CQ - DOSED IN MG/24 HOURS) 21 mg/24hr patch Place 1 patch (21 mg total) onto the skin daily.  . nicotine (NICODERM CQ - DOSED IN MG/24 HOURS) 21 mg/24hr patch   . omeprazole (PRILOSEC OTC) 20 MG tablet Take by mouth.  . ondansetron [facility]  ODT) 8 MG disintegrating tablet Take 1 tablet (8 mg total) by mouth every 8 (eight) hours as needed for nausea or vomiting (may cause constipation).  Marland Kitchen PACLITAXEL IV Inject 78 mg into the vein once a week.  . prochlorperazine (COMPAZINE) 10 MG tablet Take 1 tablet (10 mg total) by mouth every 6 (six) hours as needed (Nausea or vomiting).  . prochlorperazine (COMPAZINE) 10 MG tablet   . sertraline (ZOLOFT) 100 MG tablet TAKE 1 AND 1/2 TABLETS ONCE DAILY (Patient taking differently: Take 150 mg by mouth daily. )  . sertraline (ZOLOFT) 100 MG tablet TAKE 1 AND 1/2 TABLETS ONCE DAILY  . traZODone (DESYREL) 100 MG tablet Take 3 tablets (300 mg total) by mouth at bedtime.  . traZODone (DESYREL) 100 MG tablet Take by mouth.  . [DISCONTINUED] HYDROcodone-acetaminophen (NORCO/VICODIN) 5-325 MG tablet Take 1 tablet by mouth every 4 (four) hours as needed for moderate pain.  . [DISCONTINUED] ondansetron (ZOFRAN ODT) 8 MG disintegrating tablet Take 1 tablet (8 mg total) by mouth every 8 (eight) hours as needed for nausea or vomiting (may cause constipation).  . [EXPIRED] 0.9 %  sodium chloride infusion   . [EXPIRED] 0.9 %  sodium chloride infusion   . [EXPIRED] CARBOplatin (PARAPLATIN) 210 mg in sodium  chloride 0.9 % 250 mL chemo infusion   . [EXPIRED] dexamethasone (DECADRON) 20 mg in sodium chloride 0.9 % 50 mL IVPB   . [EXPIRED] diphenhydrAMINE (BENADRYL) injection 50 mg   . [EXPIRED] famotidine (PEPCID) IVPB 20 mg premix   . [EXPIRED] PACLitaxel (TAXOL) 78 mg in sodium chloride 0.9 % 250 mL chemo infusion (</= 80mg /m2)   . [EXPIRED] palonosetron (ALOXI) injection 0.25 mg    No facility-administered encounter medications on file as of 04/22/2018.     ALLERGIES:  Allergies  Allergen Reactions  . Levaquin [Levofloxacin] Other (See Comments)    Myalgia  . Codeine Nausea And Vomiting     PHYSICAL EXAM:  ECOG Performance status: 1  VITAL SIGNS:BP: 104/59, P:96, R:16, T:98.2, O2:97% WEIGHT: 132.4  Physical Exam Constitutional:      Appearance: Normal appearance. She is normal weight.  Cardiovascular:     Rate and Rhythm: Normal rate and regular rhythm.     Heart sounds: Normal heart sounds.  Pulmonary:     Effort: Pulmonary effort is normal.     Breath sounds: Normal breath sounds.  Musculoskeletal: Normal range of motion.  Skin:    General: Skin is warm and dry.  Neurological:     Mental Status: She is alert and oriented to person, place, and time. Mental status is at baseline.  Psychiatric:        Mood and Affect: Mood normal.        Behavior: Behavior normal.        Thought Content: Thought content normal.        Judgment: Judgment normal.      LABORATORY DATA:  I have reviewed the labs as listed.  CBC    Component Value Date/Time   WBC 6.3 04/22/2018 0825   RBC 5.24 (H) 04/22/2018 0825   HGB 15.3 (H) 04/22/2018 0825   HGB 18.1 (H) 09/25/2017 0829   HCT 47.6 (H) 04/22/2018 0825   HCT 52.8 (H) 09/25/2017 0829   PLT 231 04/22/2018 0825   PLT 239 09/25/2017 0829   MCV 90.8 04/22/2018 0825   MCV 91 09/25/2017 0829   MCH 29.2 04/22/2018 0825   MCHC 32.1 04/22/2018 0825   RDW 12.7 04/22/2018 0825  RDW 13.8 09/25/2017 0829   LYMPHSABS 0.9 04/22/2018 0825     LYMPHSABS 3.4 (H) 09/25/2017 0829   MONOABS 0.6 04/22/2018 0825   EOSABS 0.1 04/22/2018 0825   EOSABS 0.2 09/25/2017 0829   BASOSABS 0.0 04/22/2018 0825   BASOSABS 0.0 09/25/2017 0829   CMP Latest Ref Rng & Units 04/22/2018 04/15/2018 04/07/2018  Glucose 70 - 99 mg/dL 110(H) 116(H) 126(H)  BUN 6 - 20 mg/dL 10 12 11   Creatinine 0.44 - 1.00 mg/dL 0.61 0.56 0.50  Sodium 135 - 145 mmol/L 136 136 135  Potassium 3.5 - 5.1 mmol/L 4.1 3.8 3.6  Chloride 98 - 111 mmol/L 102 103 106  CO2 22 - 32 mmol/L 25 24 22   Calcium 8.9 - 10.3 mg/dL 9.3 9.0 8.7(L)  Total Protein 6.5 - 8.1 g/dL 7.0 6.8 6.7  Total Bilirubin 0.3 - 1.2 mg/dL 0.6 0.3 0.3  Alkaline Phos 38 - 126 U/L 87 71 86  AST 15 - 41 U/L 20 18 24   ALT 0 - 44 U/L 21 18 17        DIAGNOSTIC IMAGING:  I have independently reviewed the scans and discussed with the patient.   I have reviewed Francene Finders, NP's note and agree with the documentation.  I personally performed a face-to-face visit, made revisions and my assessment and plan is as follows.    ASSESSMENT & PLAN:   Cancer of right lung ([facility]) 1.  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 -She went to the ER on 02/23/2018 when a CT of the chest was done which showed necrotic adenopathy within the mediastinum and right hilum.  A central superior segment right lower lobe nodule likely represents the primary. - She has a 57-pack-year smoking history. -She lost about 12 pounds in the last couple of months.  She does have occasional dysphagia to solid foods. - She does have hemoptysis with bloodstained sputum for the past couple of months. - I reviewed the results of the PET CT scan dated 03/11/2018 which shows posterior right mediastinal confluent necrotic adenopathy.  Right lower lobe mass measuring 4.4 x 4.1 cm, intimately associated with the anterior aspect of the T4 vertebral body, without  gross osseous destruction.  There is a 9 mm subcarinal lymph node.  No other metastatic disease was seen. -We discussed the results of the MRI of the brain dated 02/26/2018 which was negative for metastatic disease. -She underwent bronch with EBUS biopsy on 03/21/2018. -I reviewed the pathology report which was consistent with adenocarcinoma. -I discussed the normal prognosis and treatment plan of stage IIIb non-small cell lung cancer.  She will need combination chemoradiation therapy.  We discussed the side effects of the chemotherapy regimen. - Weekly carboplatin and paclitaxel along with radiation therapy started on 04/07/2018. -She has tolerated weekly chemotherapy very well.  She did have some constipation.  She was told to take stool softeners 2 tablets daily.  She did have mild lightheadedness occasionally.  Blood pressure today is 104/59. -I reviewed her medications.  She is not on any blood pressure medication.  We will give her 500 mL of normal saline today. - We have reviewed her labs.  They are adequate to proceed with week 3 of treatment today. -We will reevaluate her in 1 week.  2.  Anterior chest pain: - She is continuing hydrocodone 5 mg every 6 hours as needed for her midsternal pain. -Pain is well  controlled with this regimen.        Orders placed this encounter:  Orders Placed This Encounter  Procedures  . CBC with Differential/Platelet  . Comprehensive metabolic panel      Derek [name], MD [place] [phone]

## 2018-04-22 NOTE — Progress Notes (Signed)
Pt presents today for Taxol and Carboplatin. Labs drawn today. Pt has appt with Dr. Delton Coombes. Pt complains of vertigo, constipation, and white stools. VSS.   Pt seen by Dr. Delton Coombes. VO to proceed with treatment given. VO to give pt 571ml bolus of 0.9 NS.   Treatment given today per MD orders. Tolerated infusion without adverse affects. Vital signs stable. No complaints at this time. Discharged from clinic ambulatory. F/U with [facility] as scheduled.

## 2018-04-23 ENCOUNTER — Other Ambulatory Visit: Payer: Self-pay

## 2018-04-23 ENCOUNTER — Inpatient Hospital Stay (HOSPITAL_COMMUNITY): Payer: 59

## 2018-04-23 ENCOUNTER — Emergency Department (HOSPITAL_COMMUNITY): Payer: 59

## 2018-04-23 ENCOUNTER — Inpatient Hospital Stay (HOSPITAL_COMMUNITY)
Admission: EM | Admit: 2018-04-23 | Discharge: 2018-04-25 | DRG: 871 | Disposition: A | Discharge status: Home / Self Care | Payer: 59 | Attending: Internal Medicine | Admitting: Internal Medicine

## 2018-04-23 ENCOUNTER — Other Ambulatory Visit (HOSPITAL_COMMUNITY): Payer: Self-pay

## 2018-04-23 ENCOUNTER — Encounter (HOSPITAL_COMMUNITY): Payer: Self-pay

## 2018-04-23 DIAGNOSIS — Z885 Allergy status to narcotic agent status: Secondary | ICD-10-CM | POA: Diagnosis not present

## 2018-04-23 DIAGNOSIS — Z79899 Other long term (current) drug therapy: Secondary | ICD-10-CM

## 2018-04-23 DIAGNOSIS — R197 Diarrhea, unspecified: Secondary | ICD-10-CM | POA: Diagnosis not present

## 2018-04-23 DIAGNOSIS — F419 Anxiety disorder, unspecified: Secondary | ICD-10-CM | POA: Diagnosis present

## 2018-04-23 DIAGNOSIS — R112 Nausea with vomiting, unspecified: Secondary | ICD-10-CM

## 2018-04-23 DIAGNOSIS — R1110 Vomiting, unspecified: Secondary | ICD-10-CM | POA: Diagnosis not present

## 2018-04-23 DIAGNOSIS — A084 Viral intestinal infection, unspecified: Secondary | ICD-10-CM | POA: Diagnosis present

## 2018-04-23 DIAGNOSIS — Z90710 Acquired absence of both cervix and uterus: Secondary | ICD-10-CM

## 2018-04-23 DIAGNOSIS — Z8249 Family history of ischemic heart disease and other diseases of the circulatory system: Secondary | ICD-10-CM | POA: Diagnosis not present

## 2018-04-23 DIAGNOSIS — Z823 Family history of stroke: Secondary | ICD-10-CM

## 2018-04-23 DIAGNOSIS — Z833 Family history of diabetes mellitus: Secondary | ICD-10-CM | POA: Diagnosis not present

## 2018-04-23 DIAGNOSIS — Z881 Allergy status to other antibiotic agents status: Secondary | ICD-10-CM

## 2018-04-23 DIAGNOSIS — F329 Major depressive disorder, single episode, unspecified: Secondary | ICD-10-CM | POA: Diagnosis present

## 2018-04-23 DIAGNOSIS — C3490 Malignant neoplasm of unspecified part of unspecified bronchus or lung: Secondary | ICD-10-CM | POA: Diagnosis present

## 2018-04-23 DIAGNOSIS — F445 Conversion disorder with seizures or convulsions: Secondary | ICD-10-CM | POA: Diagnosis present

## 2018-04-23 DIAGNOSIS — Z87891 Personal history of nicotine dependence: Secondary | ICD-10-CM | POA: Diagnosis not present

## 2018-04-23 DIAGNOSIS — C3491 Malignant neoplasm of unspecified part of right bronchus or lung: Secondary | ICD-10-CM | POA: Diagnosis not present

## 2018-04-23 DIAGNOSIS — A419 Sepsis, unspecified organism: Secondary | ICD-10-CM | POA: Diagnosis present

## 2018-04-23 DIAGNOSIS — X58XXXA Exposure to other specified factors, initial encounter: Secondary | ICD-10-CM | POA: Diagnosis present

## 2018-04-23 DIAGNOSIS — N390 Urinary tract infection, site not specified: Secondary | ICD-10-CM | POA: Diagnosis present

## 2018-04-23 DIAGNOSIS — J439 Emphysema, unspecified: Secondary | ICD-10-CM | POA: Diagnosis present

## 2018-04-23 DIAGNOSIS — Z79891 Long term (current) use of opiate analgesic: Secondary | ICD-10-CM

## 2018-04-23 DIAGNOSIS — R7301 Impaired fasting glucose: Secondary | ICD-10-CM | POA: Diagnosis not present

## 2018-04-23 DIAGNOSIS — J690 Pneumonitis due to inhalation of food and vomit: Secondary | ICD-10-CM | POA: Diagnosis present

## 2018-04-23 DIAGNOSIS — T451X5A Adverse effect of antineoplastic and immunosuppressive drugs, initial encounter: Secondary | ICD-10-CM | POA: Diagnosis present

## 2018-04-23 LAB — CBC WITH DIFFERENTIAL/PLATELET
Abs Immature Granulocytes: 0.05 10*3/uL (ref 0.00–0.07)
Abs Immature Granulocytes: 0.03 10*3/uL (ref 0.00–0.07)
Basophils Absolute: 0 10*3/uL (ref 0.0–0.1)
Basophils Absolute: 0 10*3/uL (ref 0.0–0.1)
Basophils Relative: 0 %
Basophils Relative: 0 %
Eosinophils Absolute: 0 10*3/uL (ref 0.0–0.5)
Eosinophils Absolute: 0.1 10*3/uL (ref 0.0–0.5)
Eosinophils Relative: 0 %
Eosinophils Relative: 1 %
HCT: 43.6 % (ref 36.0–46.0)
HCT: 38.5 % (ref 36.0–46.0)
Hemoglobin: 14.3 g/dL (ref 12.0–15.0)
Hemoglobin: 12.4 g/dL (ref 12.0–15.0)
Immature Granulocytes: 1 %
Immature Granulocytes: 1 %
Lymphocytes Relative: 1 %
Lymphocytes Relative: 3 %
Lymphs Abs: 0.1 10*3/uL — ABNORMAL LOW (ref 0.7–4.0)
Lymphs Abs: 0.2 10*3/uL — ABNORMAL LOW (ref 0.7–4.0)
MCH: 29.3 pg (ref 26.0–34.0)
MCH: 29.2 pg (ref 26.0–34.0)
MCHC: 32.8 g/dL (ref 30.0–36.0)
MCHC: 32.2 g/dL (ref 30.0–36.0)
MCV: 89.3 fL (ref 80.0–100.0)
MCV: 90.8 fL (ref 80.0–100.0)
Monocytes Absolute: 0.2 10*3/uL (ref 0.1–1.0)
Monocytes Absolute: 0.2 10*3/uL (ref 0.1–1.0)
Monocytes Relative: 3 %
Monocytes Relative: 5 %
Neutro Abs: 7.4 10*3/uL (ref 1.7–7.7)
Neutro Abs: 4.9 10*3/uL (ref 1.7–7.7)
Neutrophils Relative %: 95 %
Neutrophils Relative %: 90 %
Platelets: 189 10*3/uL (ref 150–400)
Platelets: 157 10*3/uL (ref 150–400)
RBC: 4.88 MIL/uL (ref 3.87–5.11)
RBC: 4.24 MIL/uL (ref 3.87–5.11)
RDW: 12.6 % (ref 11.5–15.5)
RDW: 12.7 % (ref 11.5–15.5)
WBC: 7.8 10*3/uL (ref 4.0–10.5)
WBC: 5.4 10*3/uL (ref 4.0–10.5)
nRBC: 0 % (ref 0.0–0.2)
nRBC: 0 % (ref 0.0–0.2)

## 2018-04-23 LAB — COMPREHENSIVE METABOLIC PANEL
ALBUMIN: 3.3 g/dL — AB (ref 3.5–5.0)
ALBUMIN: 2.6 g/dL — AB (ref 3.5–5.0)
ALT: 22 U/L (ref 0–44)
ALT: 22 U/L (ref 0–44)
ANION GAP: 4 — AB (ref 5–15)
AST: 20 U/L (ref 15–41)
AST: 21 U/L (ref 15–41)
Alkaline Phosphatase: 78 U/L (ref 38–126)
Alkaline Phosphatase: 64 U/L (ref 38–126)
Anion gap: 7 (ref 5–15)
BUN: 11 mg/dL (ref 6–20)
BUN: 8 mg/dL (ref 6–20)
CO2: 24 mmol/L (ref 22–32)
CO2: 22 mmol/L (ref 22–32)
Calcium: 8.4 mg/dL — ABNORMAL LOW (ref 8.9–10.3)
Calcium: 7.7 mg/dL — ABNORMAL LOW (ref 8.9–10.3)
Chloride: 105 mmol/L (ref 98–111)
Chloride: 112 mmol/L — ABNORMAL HIGH (ref 98–111)
Creatinine, Ser: 0.52 mg/dL (ref 0.44–1.00)
Creatinine, Ser: 0.36 mg/dL — ABNORMAL LOW (ref 0.44–1.00)
GFR calc Af Amer: >60 mL/min (ref >60)
GFR calc Af Amer: >60 mL/min (ref >60)
GFR calc non Af Amer: >60 mL/min (ref >60)
GFR calc non Af Amer: >60 mL/min (ref >60)
GLUCOSE: 138 mg/dL — AB (ref 70–99)
Glucose, Bld: 104 mg/dL — ABNORMAL HIGH (ref 70–99)
Potassium: 3.6 mmol/L (ref 3.5–5.1)
Potassium: 3.3 mmol/L — ABNORMAL LOW (ref 3.5–5.1)
SODIUM: 136 mmol/L (ref 135–145)
Sodium: 138 mmol/L (ref 135–145)
Total Bilirubin: 0.5 mg/dL (ref 0.3–1.2)
Total Bilirubin: 0.4 mg/dL (ref 0.3–1.2)
Total Protein: 6.3 g/dL — ABNORMAL LOW (ref 6.5–8.1)
Total Protein: 5.1 g/dL — ABNORMAL LOW (ref 6.5–8.1)

## 2018-04-23 LAB — LACTIC ACID, PLASMA
Lactic Acid, Venous: 0.7 mmol/L (ref 0.5–1.9)
Lactic Acid, Venous: 0.7 mmol/L (ref 0.5–1.9)

## 2018-04-23 LAB — TROPONIN I: Troponin I: <0.03 ng/mL (ref <0.03)

## 2018-04-23 LAB — PROTIME-INR
INR: 1.02
Prothrombin Time: 13.4 seconds (ref 11.4–15.2)

## 2018-04-23 LAB — INFLUENZA PANEL BY PCR (TYPE A & B)
Influenza A By PCR: NEGATIVE
Influenza B By PCR: NEGATIVE

## 2018-04-23 LAB — URINALYSIS, ROUTINE W REFLEX MICROSCOPIC
BILIRUBIN URINE: NEGATIVE
Glucose, UA: NEGATIVE mg/dL
Ketones, ur: NEGATIVE mg/dL
Leukocytes, UA: NEGATIVE
NITRITE: POSITIVE — AB
PH: 6 (ref 5.0–8.0)
Protein, ur: NEGATIVE mg/dL
Specific Gravity, Urine: 1.014 (ref 1.005–1.030)

## 2018-04-23 LAB — MRSA PCR SCREENING: MRSA by PCR: NEGATIVE

## 2018-04-23 LAB — I-STAT CG4 LACTIC ACID, ED: LACTIC ACID, VENOUS: 0.96 mmol/L (ref 0.5–1.9)

## 2018-04-23 LAB — APTT: aPTT: 32 seconds (ref 24–36)

## 2018-04-23 LAB — PROCALCITONIN: Procalcitonin: 1.54 ng/mL

## 2018-04-23 MED ORDER — VANCOMYCIN HCL IN DEXTROSE 1-5 GM/200ML-% IV SOLN: 1000.0000 mg | Freq: Once | INTRAVENOUS | Status: DC

## 2018-04-23 MED ORDER — ACETAMINOPHEN 325 MG PO TABS: 650.0000 mg | ORAL_TABLET | Freq: Four times a day (QID) | ORAL | Status: DC | PRN

## 2018-04-23 MED ORDER — VANCOMYCIN HCL IN DEXTROSE 750-5 MG/150ML-% IV SOLN
750.0000 mg | Freq: Two times a day (BID) | INTRAVENOUS | Status: DC
  Administered 2018-04-24: 750 mg via INTRAVENOUS
  Filled 2018-04-23 (×3): qty 150

## 2018-04-23 MED ORDER — ONDANSETRON HCL 4 MG/2ML IJ SOLN: 4.0000 mg | Freq: Four times a day (QID) | INTRAMUSCULAR | Status: DC | PRN

## 2018-04-23 MED ORDER — SODIUM CHLORIDE 0.9 % IV SOLN: 1.0000 g | Freq: Once | INTRAVENOUS | Status: DC

## 2018-04-23 MED ORDER — SODIUM CHLORIDE 0.9 % IV SOLN
1.0000 g | Freq: Once | INTRAVENOUS | Status: AC
  Administered 2018-04-23: 1 g via INTRAVENOUS
  Filled 2018-04-23: qty 10

## 2018-04-23 MED ORDER — IOPAMIDOL (ISOVUE-370) INJECTION 76%
75.0000 mL | Freq: Once | INTRAVENOUS | Status: AC | PRN
  Administered 2018-04-23: 75 mL via INTRAVENOUS

## 2018-04-23 MED ORDER — PANTOPRAZOLE SODIUM 40 MG PO TBEC
40.0000 mg | DELAYED_RELEASE_TABLET | Freq: Every day | ORAL | Status: DC
  Administered 2018-04-24 – 2018-04-25 (×2): 40 mg via ORAL
  Filled 2018-04-23 (×2): qty 1

## 2018-04-23 MED ORDER — ONDANSETRON HCL 4 MG/2ML IJ SOLN
4.0000 mg | Freq: Once | INTRAMUSCULAR | Status: AC
  Administered 2018-04-23: 4 mg via INTRAVENOUS
  Filled 2018-04-23: qty 2

## 2018-04-23 MED ORDER — ACETAMINOPHEN 650 MG RE SUPP: 650.0000 mg | Freq: Four times a day (QID) | RECTAL | Status: DC | PRN

## 2018-04-23 MED ORDER — SODIUM CHLORIDE 0.9 % IV BOLUS
1000.0000 mL | Freq: Once | INTRAVENOUS | Status: AC
  Administered 2018-04-23: 1000 mL via INTRAVENOUS

## 2018-04-23 MED ORDER — SODIUM CHLORIDE 0.9 % IV SOLN
1000.0000 mL | INTRAVENOUS | Status: DC
  Administered 2018-04-23 – 2018-04-25 (×5): 1000 mL via INTRAVENOUS

## 2018-04-23 MED ORDER — ENOXAPARIN SODIUM 40 MG/0.4ML ~~LOC~~ SOLN: 40.0000 mg | SUBCUTANEOUS | Status: DC

## 2018-04-23 MED ORDER — SODIUM CHLORIDE 0.9 % IV BOLUS
500.0000 mL | Freq: Once | INTRAVENOUS | Status: AC
  Administered 2018-04-23: 1000 mL via INTRAVENOUS

## 2018-04-23 MED ORDER — TRAZODONE HCL 50 MG PO TABS
300.0000 mg | ORAL_TABLET | Freq: Every day | ORAL | Status: DC
  Administered 2018-04-23 – 2018-04-24 (×2): 300 mg via ORAL
  Filled 2018-04-23 (×2): qty 6

## 2018-04-23 MED ORDER — SODIUM CHLORIDE 0.9 % IV SOLN
1.0000 g | Freq: Three times a day (TID) | INTRAVENOUS | Status: DC
  Administered 2018-04-23 – 2018-04-25 (×6): 1 g via INTRAVENOUS
  Filled 2018-04-23 (×10): qty 1

## 2018-04-23 MED ORDER — ACETAMINOPHEN 500 MG PO TABS
1000.0000 mg | ORAL_TABLET | Freq: Once | ORAL | Status: AC
  Administered 2018-04-23: 1000 mg via ORAL
  Filled 2018-04-23: qty 2

## 2018-04-23 MED ORDER — HYDROCODONE-ACETAMINOPHEN 5-325 MG PO TABS
1.0000 | ORAL_TABLET | Freq: Once | ORAL | Status: AC
  Administered 2018-04-23: 1 via ORAL
  Filled 2018-04-23: qty 1

## 2018-04-23 MED ORDER — ACETAMINOPHEN 500 MG PO TABS
ORAL_TABLET | ORAL | Status: AC
  Filled 2018-04-23: qty 1

## 2018-04-23 MED ORDER — SERTRALINE HCL 50 MG PO TABS
150.0000 mg | ORAL_TABLET | Freq: Every day | ORAL | Status: DC
  Administered 2018-04-24 – 2018-04-25 (×2): 150 mg via ORAL
  Filled 2018-04-23 (×2): qty 3

## 2018-04-23 MED ORDER — HYDROCODONE-ACETAMINOPHEN 5-325 MG PO TABS
1.0000 | ORAL_TABLET | ORAL | Status: DC | PRN
  Administered 2018-04-23 – 2018-04-25 (×7): 1 via ORAL
  Filled 2018-04-23 (×7): qty 1

## 2018-04-23 MED ORDER — OMEPRAZOLE MAGNESIUM 20 MG PO TBEC: 20.0000 mg | DELAYED_RELEASE_TABLET | Freq: Every day | ORAL | Status: DC

## 2018-04-23 MED ORDER — SODIUM CHLORIDE 0.9 % IV BOLUS
500.0000 mL | Freq: Once | INTRAVENOUS | Status: AC
  Administered 2018-04-23: 500 mL via INTRAVENOUS

## 2018-04-23 MED ORDER — VANCOMYCIN HCL 10 G IV SOLR
1250.0000 mg | Freq: Once | INTRAVENOUS | Status: AC
  Administered 2018-04-23: 1250 mg via INTRAVENOUS
  Filled 2018-04-23: qty 1250

## 2018-04-23 MED ORDER — ONDANSETRON HCL 4 MG/2ML IJ SOLN
4.0000 mg | Freq: Four times a day (QID) | INTRAMUSCULAR | Status: DC
  Administered 2018-04-23 – 2018-04-25 (×8): 4 mg via INTRAVENOUS
  Filled 2018-04-23 (×8): qty 2

## 2018-04-23 MED ORDER — ALPRAZOLAM 0.5 MG PO TABS
1.0000 mg | ORAL_TABLET | Freq: Two times a day (BID) | ORAL | Status: DC | PRN
  Administered 2018-04-24 – 2018-04-25 (×2): 1 mg via ORAL
  Filled 2018-04-23 (×2): qty 2

## 2018-04-23 MED ORDER — SODIUM CHLORIDE 0.9 % IV SOLN
INTRAVENOUS | Status: AC
  Filled 2018-04-23: qty 1

## 2018-04-23 MED ORDER — ONDANSETRON HCL 4 MG PO TABS: 4.0000 mg | ORAL_TABLET | Freq: Four times a day (QID) | ORAL | Status: DC | PRN

## 2018-04-23 MED ORDER — PROMETHAZINE HCL 25 MG/ML IJ SOLN: 12.5000 mg | Freq: Four times a day (QID) | INTRAMUSCULAR | Status: DC | PRN

## 2018-04-23 MED ORDER — VANCOMYCIN HCL 10 G IV SOLR: 1250.0000 mg | Freq: Once | INTRAVENOUS | Status: DC

## 2018-04-23 NOTE — ED Notes (Signed)
Patient unable to void at this time

## 2018-04-23 NOTE — ED Provider Notes (Signed)
[facility] EMERGENCY DEPARTMENT Provider Note   CSN: [id number] Arrival date & time: 04/23/18  0845     History   Chief Complaint Chief Complaint  Patient presents with  . Fever    HPI Sharon Berg is a 53 y.o. female.  HPI Patient received chemotherapy yesterday.  Starting around 11 PM last night developed multiple episodes of vomiting (>10) and diarrhea.  No blood in the vomit or stool.  Patient admits to generalized weakness and nausea.  Had abdominal pain earlier which has had resolved.  Had low-grade fever to 100.5 at home.  Has had mild cough.  No new rashes.  No urinary symptoms. Past Medical History:  Diagnosis Date  . Anxiety   . Depression   . Lung cancer ([place])   . Migraine   . Pneumonia   . Seizures ([place])    stressed induced    Patient Active Problem List   Diagnosis Date Noted  . Sepsis due to undetermined organism ([place]) 04/23/2018  . Primary lung cancer, right ([place])   . Cancer of right lung ([place]) 02/28/2018  . Seasonal affective disorder ([place]) 04/01/2017  . Seizure-like activity ([place]) 08/10/2016  . Impaired fasting glucose 11/11/2015  . Tension headache 12/18/2013  . 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 Anxiety and depression 07/30/2013    Past Surgical History:  Procedure Laterality Date  . ABDOMINAL HYSTERECTOMY  2000  . PORTACATH PLACEMENT Left 03/14/2018   Procedure: INSERTION PORT-A-CATH;  Surgeon: Aviva Signs, MD;  Location: [facility];  Service: General;  Laterality: Left;  Marland Kitchen VIDEO BRONCHOSCOPY WITH ENDOBRONCHIAL ULTRASOUND N/A 03/21/2018   Procedure: VIDEO BRONCHOSCOPY WITH ENDOBRONCHIAL ULTRASOUND;  Surgeon: Melrose Nakayama, MD;  Location: [place];  Service: Thoracic;  Laterality: N/A;     OB History   No obstetric history on file.      Home Medications    Prior to Admission medications   Medication Sig Start Date End Date Taking? Authorizing Provider  ALPRAZolam (XANAX) 1 MG tablet TAKE 1/2 TO 1 TABLET TWICE A DAY FOR  ANXIETY Patient taking differently: Take 1 mg by mouth 2 (two) times daily. TAKE 1/2 TO 1 TABLET TWICE A DAY FOR ANXIETY 12/15/17  Yes [name], MD  CARBOPLATIN IV Inject into the vein once a week.   Yes [provider]  HYDROcodone-acetaminophen (NORCO/VICODIN) 5-325 MG tablet Take 1 tablet by mouth every 4 (four) hours as needed for moderate pain. 04/22/18  Yes Lockamy, Randi L, NP-C  lidocaine-prilocaine (EMLA) cream Apply small amount to port site one hour prior to appointment. Cover with plastic wrap. 03/29/18  Yes [provider]  omeprazole (PRILOSEC OTC) 20 MG tablet Take 20 mg by mouth daily.  03/28/18 06/26/18 Yes [provider]  ondansetron (ZOFRAN ODT) 8 MG disintegrating tablet Take 1 tablet (8 mg total) by mouth every 8 (eight) hours as needed for nausea or vomiting (may cause constipation). 04/22/18  Yes Lockamy, Randi L, NP-C  PACLITAXEL IV Inject 78 mg into the vein once a week.   Yes [provider]  prochlorperazine (COMPAZINE) 10 MG tablet Take 1 tablet (10 mg total) by mouth every 6 (six) hours as needed (Nausea or vomiting). 03/29/18  Yes Derek [name], MD  sertraline (ZOLOFT) 100 MG tablet TAKE 1 AND 1/2 TABLETS ONCE DAILY Patient taking differently: Take 150 mg by mouth daily.  12/15/17  Yes [name], MD  traZODone (DESYREL) 100 MG tablet Take 3 tablets (300 mg total) by mouth at  bedtime. 12/15/17  Yes [name], MD    Family History Family History  Problem Relation Age of Onset  . Hypertension Mother   . Diabetes Father   . Heart disease Maternal Grandmother   . Stroke Maternal Grandmother   . Heart disease Maternal Grandfather   . Dementia Paternal Grandmother   . Dementia Paternal Grandfather     Social History Social History   Tobacco Use  . Smoking status: Former Smoker    Packs/day: 0.12    Years: 38.00    Pack years: 4.56    Types: Cigarettes  . Smokeless tobacco: Never Used  . Tobacco  comment: .2 cigs  a day   Substance Use Topics  . Alcohol use: No    Alcohol/week: 0.0 standard drinks  . Drug use: No     Allergies   Levaquin [levofloxacin] and Codeine   Review of Systems Review of Systems  Constitutional: Positive for fatigue and fever.  HENT: Negative for congestion and sore throat.   Eyes: Negative for visual disturbance.  Respiratory: Positive for cough. Negative for shortness of breath.   Cardiovascular: Negative for chest pain, palpitations and leg swelling.  Gastrointestinal: Positive for abdominal pain, diarrhea, nausea and vomiting. Negative for blood in stool and constipation.  Genitourinary: Negative for dysuria, flank pain, frequency, pelvic pain, vaginal bleeding and vaginal discharge.  Musculoskeletal: Positive for back pain and myalgias. Negative for neck pain and neck stiffness.  Skin: Negative for rash and wound.  Neurological: Positive for weakness. Negative for dizziness, syncope, light-headedness, numbness and headaches.  All other systems reviewed and are negative.    Physical Exam Updated Vital Signs BP 96/66   Pulse 88   Temp 99.8 F (37.7 C) (Oral)   Resp 16   Ht 5\' 5"  (1.651 m)   Wt 59.9 kg   SpO2 95%   BMI 21.97 kg/m   Physical Exam Vitals signs and nursing note reviewed.  Constitutional:      General: She is not in acute distress.    Appearance: Normal appearance. She is well-developed. She is not ill-appearing.  HENT:     Head: Normocephalic and atraumatic.     Nose: Nose normal.     Mouth/Throat:     Mouth: Mucous membranes are moist.     Pharynx: No oropharyngeal exudate or posterior oropharyngeal erythema.  Eyes:     Extraocular Movements: Extraocular movements intact.     Conjunctiva/sclera: Conjunctivae normal.     Pupils: Pupils are equal, round, and reactive to light.  Neck:     Musculoskeletal: Normal range of motion and neck supple. No neck rigidity or muscular tenderness.     Vascular: No carotid  bruit.  Cardiovascular:     Rate and Rhythm: Normal rate and regular rhythm.     Heart sounds: No murmur. No friction rub. No gallop.   Pulmonary:     Effort: Pulmonary effort is normal. No respiratory distress.     Breath sounds: Normal breath sounds. No stridor. No wheezing, rhonchi or rales.  Chest:     Chest wall: No tenderness.  Abdominal:     General: Bowel sounds are normal. There is no distension.     Palpations: Abdomen is soft.     Tenderness: There is no abdominal tenderness. There is no guarding or rebound.  Musculoskeletal: Normal range of motion.        General: No tenderness.     Comments: Mild diffuse bilateral thoracic muscular tenderness to palpation.  No definite midline thoracic or lumbar tenderness.  No CVA tenderness.  No lower extremity swelling, tenderness, asymmetry.  Distal pulses intact.  Lymphadenopathy:     Cervical: No cervical adenopathy.  Skin:    General: Skin is warm and dry.     Findings: No erythema or rash.  Neurological:     General: No focal deficit present.     Mental Status: She is alert and oriented to person, place, and time.     Comments: 5/5 motor in all extremities.  Sensation intact.  Psychiatric:        Mood and Affect: Mood normal.        Behavior: Behavior normal.      ED Treatments / Results  Labs (all labs ordered are listed, but only abnormal results are displayed) Labs Reviewed  COMPREHENSIVE METABOLIC PANEL - Abnormal; Notable for the following components:      Result Value   Glucose, Bld 138 (*)    Calcium 8.4 (*)    Total Protein 6.3 (*)    Albumin 3.3 (*)    All other components within normal limits  CBC WITH DIFFERENTIAL/PLATELET - Abnormal; Notable for the following components:   Lymphs Abs 0.1 (*)    All other components within normal limits  URINALYSIS, ROUTINE W REFLEX MICROSCOPIC - Abnormal; Notable for the following components:   Color, Urine AMBER (*)    APPearance HAZY (*)    Hgb urine dipstick  MODERATE (*)    Nitrite POSITIVE (*)    Bacteria, UA MANY (*)    All other components within normal limits  CULTURE, BLOOD (ROUTINE X 2)  CULTURE, BLOOD (ROUTINE X 2)  URINE CULTURE  INFLUENZA PANEL BY PCR (TYPE A & B)  I-STAT CG4 LACTIC ACID, ED    EKG EKG Interpretation  Date/Time:  Saturday April 23 2018 08:52:55 EST Ventricular Rate:  98 PR Interval:    QRS Duration: 83 QT Interval:  361 QTC Calculation: 461 R Axis:   72 Text Interpretation:  Sinus rhythm Confirmed by Julianne Rice [phone]) on 04/23/2018 2:47:57 PM   Radiology Dg Chest 2 View  Result Date: 04/23/2018 CLINICAL DATA:  Lung carcinoma, vomiting and fever. EXAM: CHEST - 2 VIEW COMPARISON:  03/21/2018 FINDINGS: Stable appearance of left-sided Port-A-Cath with catheter tip in the SVC. Right-sided suprahilar lung mass shows decreased prominence by chest x-ray since December. Stable underlying chronic emphysematous lung disease. Bibasilar atelectasis and scarring present. No overt airspace consolidation, edema or pleural fluid identified. No pneumothorax. Visualized bony structures are unremarkable. IMPRESSION: No acute process identified. The right-sided suprahilar lung mass shows decreased prominence by chest x-ray since December. Electronically Signed   By: Aletta Edouard M.D.   On: 04/23/2018 09:28    Procedures Procedures (including critical care time)  Medications Ordered in ED Medications  0.9 %  sodium chloride infusion (1,000 mLs Intravenous New Bag/Given 04/23/18 0933)  sodium chloride 0.9 % bolus 1,000 mL (has no administration in time range)  ondansetron (ZOFRAN) injection 4 mg (has no administration in time range)  acetaminophen (TYLENOL) tablet 1,000 mg (has no administration in time range)  sodium chloride 0.9 % bolus 500 mL (0 mLs Intravenous Stopped 04/23/18 1036)  ondansetron (ZOFRAN) injection 4 mg (4 mg Intravenous Given 04/23/18 0936)  sodium chloride 0.9 % bolus 500 mL (0 mLs Intravenous Stopped  04/23/18 1236)  HYDROcodone-acetaminophen (NORCO/VICODIN) 5-325 MG per tablet 1 tablet (1 tablet Oral Given 04/23/18 1133)  cefTRIAXone (ROCEPHIN) 1 g in sodium chloride 0.9 %  100 mL IVPB (0 g Intravenous Stopped 04/23/18 1246)     Initial Impression / Assessment and Plan / ED Course  I have reviewed the triage vital signs and the nursing notes.  Pertinent labs & imaging results that were available during my care of the patient were reviewed by me and considered in my medical decision making (see chart for details).     Patient states she is feeling better after medication and IV fluids.  Lactic acid is normal.  Normal white blood cell count.  Appears that she does have a urinary tract infection with many bacteria and nitrite positive UA results.  Despite multiple boluses of IV fluids blood pressure remains soft with systolics in the 52'Y.  Baseline blood pressure appears to be in the low 100s.  Discussed with patient's oncologist.  Will admit to hospitalist for observation.  Final Clinical Impressions(s) / ED Diagnoses   Final diagnoses:  Vomiting and diarrhea  Acute lower UTI    ED Discharge Orders    None       Julianne Rice, MD 04/23/18 [phone]

## 2018-04-23 NOTE — H&P (Signed)
History and Physical  Sharon Berg EOF:[id number] DOB: Aug 25, 1965 DOA: 04/23/2018   PCP: [name], MD   Patient coming from: Home  Chief Complaint: Nausea, vomiting, and fever  HPI:  Sharon Berg is a 53 y.o. female with medical history of non-small cell lung carcinoma diagnosed on 03/21/2018 after EBUS, depression, anxiety presented with 1 day history of nausea, vomiting, and loose stools.  The patient recently started on paclitaxel and carboplatin on 04/07/2018 for her lung cancer.  Her last dose was on 04/22/2018.  She had innumerable episodes of emesis on the evening of 04/22/2018 without any blood.  She had a temperature of 100.5 F.  She also has an associated headache but denies any visual disturbance, neck pain, focal extremity weakness.  She has had anterior chest wall pain since November intermittently.  She has been treated with hydrocodone.  She states that her chest pain is sharp and intermittent that is worse with palpation and certain movement.  The patient denies any recent sick contacts, eating unusual or raw food, or any recent travels.  She had 2 episodes of loose stools without any hematochezia or melena.  She denies any abdominal pain, dysuria, hematuria.  There is no pain any rashes or synovitis. Upon presentation, the patient was noted to have a temperature of 99.8 F with hypotension.  Her blood pressure in the ED was as low as 76/53.  Oxygen saturation was 94-95% on room air.  BMP and LFTs were essentially unremarkable.  CBC showed WBC 7.8 with hemoglobin 14.3 and platelets 189,000.  Lactic acid was 0.96.  Influenza PCR was negative.  UA showed 0-5 WBC.  Chest x-ray showed right suprahilar mass without any acute findings.  The patient was started ceftriaxone after blood cultures were obtained.  Assessment/Plan: Sepsis -Etiology unknown, work-up in progress -Concerned about postobstructive pneumonia -Patient presented with fever and hypotension -Patient had CT  chest 02/23/2018 that showed postobstructive pneumonia -Start vancomycin and meropenem pending culture data -Lactic acid 0.96 -Check procalcitonin -Continue IV fluids -Influenza PCR negative -Check viral respiratory panel  Anterior chest wall pain -Cycle troponins -Continue home dose hydrocodone -CT angiogram chest  Intractable nausea and vomiting -Start Zofran around-the-clock -IV fluids  Depression/anxiety -Continue home dose alprazolam and Zoloft       Past Medical History:  Diagnosis Date  . Anxiety   . Depression   . Lung cancer ([place])   . Migraine   . Pneumonia   . Seizures ([place])    stressed induced   Past Surgical History:  Procedure Laterality Date  . ABDOMINAL HYSTERECTOMY  2000  . PORTACATH PLACEMENT Left 03/14/2018   Procedure: INSERTION PORT-A-CATH;  Surgeon: Aviva Signs, MD;  Location: [facility];  Service: General;  Laterality: Left;  Marland Kitchen VIDEO BRONCHOSCOPY WITH ENDOBRONCHIAL ULTRASOUND N/A 03/21/2018   Procedure: VIDEO BRONCHOSCOPY WITH ENDOBRONCHIAL ULTRASOUND;  Surgeon: Melrose Nakayama, MD;  Location: [facility];  Service: Thoracic;  Laterality: N/A;   Social History:  reports that she has quit smoking. Her smoking use included cigarettes. She has a 4.56 pack-year smoking history. She has never used smokeless tobacco. She reports that she does not drink alcohol or use drugs.   Family History  Problem Relation Age of Onset  . Hypertension Mother   . Diabetes Father   . Heart disease Maternal Grandmother   . Stroke Maternal Grandmother   . Heart disease Maternal Grandfather   . Dementia Paternal Grandmother   . Dementia Paternal Grandfather  Allergies  Allergen Reactions  . Levaquin [Levofloxacin] Other (See Comments)    Myalgia  . Codeine Nausea And Vomiting     Prior to Admission medications   Medication Sig Start Date End Date Taking? Authorizing Provider  ALPRAZolam (XANAX) 1 MG tablet TAKE 1/2 TO 1 TABLET TWICE A DAY FOR  ANXIETY Patient taking differently: Take 1 mg by mouth 2 (two) times daily. TAKE 1/2 TO 1 TABLET TWICE A DAY FOR ANXIETY 12/15/17  Yes [name], MD  CARBOPLATIN IV Inject into the vein once a week.   Yes [provider]  HYDROcodone-acetaminophen (NORCO/VICODIN) 5-325 MG tablet Take 1 tablet by mouth every 4 (four) hours as needed for moderate pain. 04/22/18  Yes Lockamy, Randi L, NP-C  lidocaine-prilocaine (EMLA) cream Apply small amount to port site one hour prior to appointment. Cover with plastic wrap. 03/29/18  Yes [provider]  omeprazole (PRILOSEC OTC) 20 MG tablet Take 20 mg by mouth daily.  03/28/18 06/26/18 Yes [provider]  ondansetron (ZOFRAN ODT) 8 MG disintegrating tablet Take 1 tablet (8 mg total) by mouth every 8 (eight) hours as needed for nausea or vomiting (may cause constipation). 04/22/18  Yes Lockamy, Randi L, NP-C  PACLITAXEL IV Inject 78 mg into the vein once a week.   Yes [provider]  prochlorperazine (COMPAZINE) 10 MG tablet Take 1 tablet (10 mg total) by mouth every 6 (six) hours as needed (Nausea or vomiting). 03/29/18  Yes Derek [name], MD  sertraline (ZOLOFT) 100 MG tablet TAKE 1 AND 1/2 TABLETS ONCE DAILY Patient taking differently: Take 150 mg by mouth daily.  12/15/17  Yes [name], MD  traZODone (DESYREL) 100 MG tablet Take 3 tablets (300 mg total) by mouth at bedtime. 12/15/17  Yes [name], MD    Review of Systems:  Constitutional:  No weight loss, night sweats Head&Eyes: No headache.  No vision loss.  No eye pain or scotoma ENT:  No Difficulty swallowing,Tooth/dental problems,Sore throat,  No ear ache, post nasal drip,  Cardio-vascular:  No  Orthopnea, PND, swelling in lower extremities,  dizziness, palpitations  GI:  No  abdominal pain, nausea, vomiting, diarrhea, loss of appetite, hematochezia, melena, heartburn, indigestion, Resp:  No coughing up of blood .No wheezing.No chest  wall deformity  Skin:  no rash or lesions.  GU:  no dysuria, change in color of urine, no urgency or frequency. No flank pain.  Musculoskeletal:  No joint pain or swelling. No decreased range of motion. No back pain.  Psych:  No change in mood or affect. No depression or anxiety. Neurologic: No headache, no dysesthesia, no focal weakness, no vision loss. No syncope  Physical Exam: Vitals:   04/23/18 1330 04/23/18 1400 04/23/18 1430 04/23/18 1502  BP: 97/62 92/62 96/66  106/64  Pulse: 78 80 88 78  Resp: 15 17 16 18   Temp:    98.4 F (36.9 C)  TempSrc:    Oral  SpO2: 93% 93% 95% 95%  Weight:      Height:       General:  A&O x 3, NAD, nontoxic, pleasant/cooperative Head/Eye: No conjunctival hemorrhage, no icterus, Alex/AT, No nystagmus ENT:  No icterus,  No thrush, good dentition, no pharyngeal exudate Neck:  No masses, no lymphadenpathy, no bruits CV:  RRR, no rub, no gallop, no S3 Lung: Bibasilar rales.  No wheezing.  Diminished breath sounds bilateral. Abdomen: soft/NT, +BS, nondistended, no peritoneal signs Ext: No cyanosis, No rashes, No petechiae, No  lymphangitis, No edema Neuro: CNII-XII intact, strength 4/5 in bilateral upper and lower extremities, no dysmetria  Labs on Admission:  Basic Metabolic Panel: Recent Labs  Lab 04/22/18 0825 04/23/18 0902  NA 136 136  K 4.1 3.6  CL 102 105  CO2 25 24  GLUCOSE 110* 138*  BUN 10 11  CREATININE 0.61 0.52  CALCIUM 9.3 8.4*   Liver Function Tests: Recent Labs  Lab 04/22/18 0825 04/23/18 0902  AST 20 20  ALT 21 22  ALKPHOS 87 78  BILITOT 0.6 0.5  PROT 7.0 6.3*  ALBUMIN 3.7 3.3*   No results for input(s): LIPASE, AMYLASE in the last 168 hours. No results for input(s): AMMONIA in the last 168 hours. CBC: Recent Labs  Lab 04/22/18 0825 04/23/18 0902  WBC 6.3 7.8  NEUTROABS 4.7 7.4  HGB 15.3* 14.3  HCT 47.6* 43.6  MCV 90.8 89.3  PLT 231 189   Coagulation Profile: No results for input(s): INR, PROTIME in  the last 168 hours. Cardiac Enzymes: No results for input(s): CKTOTAL, CKMB, CKMBINDEX, TROPONINI in the last 168 hours. BNP: Invalid input(s): POCBNP CBG: No results for input(s): GLUCAP in the last 168 hours. Urine analysis:    Component Value Date/Time   COLORURINE AMBER (A) 04/23/2018 0902   APPEARANCEUR HAZY (A) 04/23/2018 0902   LABSPEC 1.014 04/23/2018 0902   PHURINE 6.0 04/23/2018 0902   GLUCOSEU NEGATIVE 04/23/2018 0902   HGBUR MODERATE (A) 04/23/2018 0902   BILIRUBINUR NEGATIVE 04/23/2018 0902   KETONESUR NEGATIVE 04/23/2018 0902   PROTEINUR NEGATIVE 04/23/2018 0902   UROBILINOGEN 0.2 10/07/2013 1750   NITRITE POSITIVE (A) 04/23/2018 0902   LEUKOCYTESUR NEGATIVE 04/23/2018 0902   Sepsis Labs: @LABRCNTIP (procalcitonin:4,lacticidven:4) ) Recent Results (from the past 240 hour(s))  Blood Culture (routine x 2)     Status: None (Preliminary result)   Collection Time: 04/23/18  9:02 AM  Result Value Ref Range Status   Specimen Description   Final    BLOOD LEFT FOREARM BOTTLES DRAWN AEROBIC AND ANAEROBIC   Special Requests   Final    Blood Culture adequate volume Performed at [facility], [address] [place], [place] [address]    Culture PENDING  Incomplete   Report Status PENDING  Incomplete  Blood Culture (routine x 2)     Status: None (Preliminary result)   Collection Time: 04/23/18  9:07 AM  Result Value Ref Range Status   Specimen Description   Final    BLOOD RIGHT HAND BOTTLES DRAWN AEROBIC AND ANAEROBIC   Special Requests   Final    Blood Culture adequate volume Performed at [facility], [address], [place], [place] [address]    Culture PENDING  Incomplete   Report Status PENDING  Incomplete     Radiological Exams on Admission: Dg Chest 2 View  Result Date: 04/23/2018 CLINICAL DATA:  Lung carcinoma, vomiting and fever. EXAM: CHEST - 2 VIEW COMPARISON:  03/21/2018 FINDINGS: Stable appearance of left-sided Port-A-Cath with catheter tip in the SVC.  Right-sided suprahilar lung mass shows decreased prominence by chest x-ray since December. Stable underlying chronic emphysematous lung disease. Bibasilar atelectasis and scarring present. No overt airspace consolidation, edema or pleural fluid identified. No pneumothorax. Visualized bony structures are unremarkable. IMPRESSION: No acute process identified. The right-sided suprahilar lung mass shows decreased prominence by chest x-ray since December. Electronically Signed   By: Aletta Edouard M.D.   On: 04/23/2018 09:28    EKG: Independently reviewed.  Sinus rhythm, nonspecific T wave change  Time spent:60 minutes Code Status:   FULL Family Communication:  Spouse updated at bedside 1/4 Disposition Plan: expect 2-3 day hospitalization Consults called: none DVT Prophylaxis: [place] Lovenox  Orson Eva, DO  [facility] Pager [phone]  If 7PM-7AM, please contact night-coverage [email or web address] Password [email or web address] 04/23/2018, 3:11 PM

## 2018-04-23 NOTE — ED Triage Notes (Signed)
EMS reports pt had chemo treatment yesterday and started having fever, n/v/d around 11pm last night.  Reports pain in chest but says is normal.  Pt also getting radiation 5 days a week.

## 2018-04-23 NOTE — Progress Notes (Signed)
Pharmacy Antibiotic Note  Sharon Berg is a 53 y.o. female admitted on 04/23/2018 with pneumonia.  Pharmacy has been consulted for vancomycin and meropenem  dosing.  Plan:  Start: meropenem 1g IV q8h Loading dose:  vancomycin 1.25g IV x1 dose Maintenance dose:  vancomycin 750mg  q12h Goal vancomycin  trough range:  15-20  mcg/mL Pharmacy will continue to monitor renal function, vancomycin troughs as clinically necessary, cultures and patient progress.     Height: 5\' 5"  (165.1 cm) Weight: 132 lb (59.9 kg) IBW/kg (Calculated) : 57  Temp (24hrs), Avg:98.9 F (37.2 C), Min:98.4 F (36.9 C), Max:99.8 F (37.7 C)  Recent Labs  Lab 04/22/18 0825 04/23/18 0902 04/23/18 0914  WBC 6.3 7.8  --   CREATININE 0.61 0.52  --   LATICACIDVEN  --   --  0.96    Estimated Creatinine Clearance: 72.3 mL/min (by C-G formula based on SCr of 0.52 mg/dL).    Allergies  Allergen Reactions  . Levaquin [Levofloxacin] Other (See Comments)    Myalgia  . Codeine Nausea And Vomiting    Antimicrobials this admission: Meropenem 1/4 >>   Vancomycin 1/4 >>   Ceftriaxone 1/4>> 1/4 (one dose in ED)   Microbiology results: 1/4 BC x2:  1/4 UCx:    1/4: Influenza A/B PCR: negative      Thank you for allowing pharmacy to be a part of this patient's care.  Despina Pole 04/23/2018 4:25 PM

## 2018-04-23 NOTE — ED Triage Notes (Signed)
Pt reports temp 101 at home.  TOok hydrocodone at 8am.

## 2018-04-24 DIAGNOSIS — N390 Urinary tract infection, site not specified: Secondary | ICD-10-CM

## 2018-04-24 LAB — BASIC METABOLIC PANEL
Anion gap: 6 (ref 5–15)
BUN: 7 mg/dL (ref 6–20)
CO2: 22 mmol/L (ref 22–32)
Calcium: 7.9 mg/dL — ABNORMAL LOW (ref 8.9–10.3)
Chloride: 110 mmol/L (ref 98–111)
Creatinine, Ser: 0.35 mg/dL — ABNORMAL LOW (ref 0.44–1.00)
GFR calc Af Amer: >60 mL/min (ref >60)
GFR calc non Af Amer: >60 mL/min (ref >60)
Glucose, Bld: 88 mg/dL (ref 70–99)
POTASSIUM: 3.2 mmol/L — AB (ref 3.5–5.1)
Sodium: 138 mmol/L (ref 135–145)

## 2018-04-24 LAB — CBC
HCT: 39.9 % (ref 36.0–46.0)
Hemoglobin: 12.8 g/dL (ref 12.0–15.0)
MCH: 29.1 pg (ref 26.0–34.0)
MCHC: 32.1 g/dL (ref 30.0–36.0)
MCV: 90.7 fL (ref 80.0–100.0)
Platelets: 156 10*3/uL (ref 150–400)
RBC: 4.4 MIL/uL (ref 3.87–5.11)
RDW: 12.9 % (ref 11.5–15.5)
WBC: 5.1 10*3/uL (ref 4.0–10.5)
nRBC: 0 % (ref 0.0–0.2)

## 2018-04-24 LAB — HEMOGLOBIN A1C
Hgb A1c MFr Bld: 5.5 % (ref 4.8–5.6)
Mean Plasma Glucose: 111.15 mg/dL

## 2018-04-24 LAB — TROPONIN I: Troponin I: <0.03 ng/mL (ref <0.03)

## 2018-04-24 NOTE — Progress Notes (Addendum)
At [place] patient's husband came out of patient's room and yelled for nurse to come right now. I went into the patient's room, and the husband said patient was having a seizure. The patient was sitting in the bed, was still and calm and was staring at the wall. She stated it was blurry, and was tapping her right hand on her leg repeatedly. She was able to answer questions and respond appropriately. Vital signs had no change. Husband stated patient does "have a history of seizures and used to take medication for the seizures but the doctors took her off of it because it was stress related". The episode lasted until about 0914. And then the patient stated she needed to lay down and relax. Patient is laying in bed, is calm and comfortable. Husband is in room with patient. Will continue to monitor. Vitals are stable. Will mention to Dr. Carles Collet as he makes rounds.

## 2018-04-24 NOTE — Progress Notes (Addendum)
PROGRESS NOTE  Sharon Berg ION:[id number] DOB: 08-08-1965 DOA: 04/23/2018 PCP: [name], MD  Brief History:  53 y.o. female with medical history of non-small cell lung carcinoma diagnosed on 03/21/2018 after EBUS, depression, anxiety presented with 1 day history of nausea, vomiting, and loose stools.  The patient recently started on paclitaxel and carboplatin on 04/07/2018 for her lung cancer.  Her last dose was on 04/22/2018.  She had innumerable episodes of emesis on the evening of 04/22/2018 without any blood.  She had a temperature of 100.5 F.  She also has an associated headache but denies any visual disturbance, neck pain, focal extremity weakness.  She has had anterior chest wall pain since November intermittently.  She has been treated with hydrocodone.  She states that her chest pain is sharp and intermittent that is worse with palpation and certain movement.  The patient denies any recent sick contacts, eating unusual or raw food, or any recent travels.  She had 2 episodes of loose stools without any hematochezia or melena.  She denies any abdominal pain, dysuria, hematuria.  There is no pain any rashes or synovitis. Upon presentation, the patient was noted to have a temperature of 99.8 F with hypotension.  Her blood pressure in the ED was as low as 76/53.  Oxygen saturation was 94-95% on room air.  BMP and LFTs were essentially unremarkable.  CBC showed WBC 7.8 with hemoglobin 14.3 and platelets 189,000.  Lactic acid was 0.96.  Influenza PCR was negative.  UA showed 0-5 WBC.  Chest x-ray showed right suprahilar mass without any acute findings.  The patient was started ceftriaxone after blood cultures were obtained.  Assessment/Plan: Sepsis -due to aspiration/postobstructive pneumonia -Patient presented with fever and hypotension -Patient had CT chest 02/23/2018 that showed postobstructive pneumonia -Start vancomycin and meropenem pending culture data -Lactic acid  0.96 -Check procalcitonin--1.54 -Continue IV fluids -Influenza PCR negative -Check viral respiratory panel--pending  Anterior chest wall pain -Cycle troponins neg x 3 -Continue home dose hydrocodone -CT angiogram chest--neg PE; progressive cavitation right perihilar tumor with stable size.  Extensive bronchitic and bullous emphysema changes.  Persistent extensive tumor and confluent lymphadenopathy in the right hilum and right mediastinum without any changes in size.  Tumor wrapping around the right mainstem and right upper lobe bronchus.  Intractable nausea and vomiting -Start Zofran around-the-clock -IV fluids  Depression/anxiety -Continue home dose alprazolam and Zoloft  Seizure-like activity -Patient has history of PNES -Monitor clinically    Disposition Plan:   Home 1/6 or 1/7 Family Communication:   Spouse updated at bedside 1/5  Consultants:  none  Code Status:  FULL   DVT Prophylaxis:  Speculator Lovenox   Procedures: As Listed in Progress Note Above  Antibiotics: vanco 1/4>>1/5 merrem 1/5>>>   Total time spent 35 minutes.  Greater than 50% spent face to face counseling and coordinating care.    Subjective: Patient denies fevers, chills, headache, chest pain, dyspnea, nausea, vomiting, diarrhea, abdominal pain, dysuria, hematuria, hematochezia, and melena.   Objective: Vitals:   04/24/18 0800 04/24/18 0900 04/24/18 1000 04/24/18 1118  BP:  (!) 106/45 112/66   Pulse: 63 71 67   Resp: 13 18 (!) 9 15  Temp:    98.8 F (37.1 C)  TempSrc:    Oral  SpO2:  94% 93%   Weight:      Height:        Intake/Output Summary (Last 24 hours) at 04/24/2018  1205 Last data filed at 04/24/2018 1000 Gross per 24 hour  Intake 4206.27 ml  Output -  Net 4206.27 ml   Weight change:  Exam:   General:  Pt is alert, follows commands appropriately, not in acute distress  HEENT: No icterus, No thrush, No neck mass, [place]/AT  Cardiovascular: RRR, S1/S2, no rubs, no  gallops  Respiratory: diminished BS; bilateral rales R>L  Abdomen: Soft/+BS, non tender, non distended, no guarding  Extremities: No edema, No lymphangitis, No petechiae, No rashes, no synovitis   Data Reviewed: I have personally reviewed following labs and imaging studies Basic Metabolic Panel: Recent Labs  Lab 04/22/18 0825 04/23/18 0902 04/23/18 1811 04/24/18 0433  NA 136 136 138 138  K 4.1 3.6 3.3* 3.2*  CL 102 105 112* 110  CO2 25 24 22 22   GLUCOSE 110* 138* 104* 88  BUN 10 11 8 7   CREATININE 0.61 0.52 0.36* 0.35*  CALCIUM 9.3 8.4* 7.7* 7.9*   Liver Function Tests: Recent Labs  Lab 04/22/18 0825 04/23/18 0902 04/23/18 1811  AST 20 20 21   ALT 21 22 22   ALKPHOS 87 78 64  BILITOT 0.6 0.5 0.4  PROT 7.0 6.3* 5.1*  ALBUMIN 3.7 3.3* 2.6*   No results for input(s): LIPASE, AMYLASE in the last 168 hours. No results for input(s): AMMONIA in the last 168 hours. Coagulation Profile: Recent Labs  Lab 04/23/18 1811  INR 1.02   CBC: Recent Labs  Lab 04/22/18 0825 04/23/18 0902 04/23/18 1811 04/24/18 0433  WBC 6.3 7.8 5.4 5.1  NEUTROABS 4.7 7.4 4.9  --   HGB 15.3* 14.3 12.4 12.8  HCT 47.6* 43.6 38.5 39.9  MCV 90.8 89.3 90.8 90.7  PLT 231 189 157 156   Cardiac Enzymes: Recent Labs  Lab 04/23/18 1811 04/23/18 2251 04/24/18 0433  TROPONINI <0.03 <0.03 <0.03   BNP: Invalid input(s): POCBNP CBG: No results for input(s): GLUCAP in the last 168 hours. HbA1C: No results for input(s): HGBA1C in the last 72 hours. Urine analysis:    Component Value Date/Time   COLORURINE AMBER (A) 04/23/2018 0902   APPEARANCEUR HAZY (A) 04/23/2018 0902   LABSPEC 1.014 04/23/2018 0902   PHURINE 6.0 04/23/2018 0902   GLUCOSEU NEGATIVE 04/23/2018 0902   HGBUR MODERATE (A) 04/23/2018 0902   BILIRUBINUR NEGATIVE 04/23/2018 0902   KETONESUR NEGATIVE 04/23/2018 0902   PROTEINUR NEGATIVE 04/23/2018 0902   UROBILINOGEN 0.2 10/07/2013 1750   NITRITE POSITIVE (A) 04/23/2018  0902   LEUKOCYTESUR NEGATIVE 04/23/2018 0902   Sepsis Labs: @LABRCNTIP (procalcitonin:4,lacticidven:4) ) Recent Results (from the past 240 hour(s))  Blood Culture (routine x 2)     Status: None (Preliminary result)   Collection Time: 04/23/18  9:02 AM  Result Value Ref Range Status   Specimen Description   Final    BLOOD LEFT FOREARM BOTTLES DRAWN AEROBIC AND ANAEROBIC   Special Requests Blood Culture adequate volume  Final   Culture   Final    NO GROWTH 1 DAY Performed at [facility], [address], [place], [place] [address]    Report Status PENDING  Incomplete  Blood Culture (routine x 2)     Status: None (Preliminary result)   Collection Time: 04/23/18  9:07 AM  Result Value Ref Range Status   Specimen Description   Final    BLOOD RIGHT HAND BOTTLES DRAWN AEROBIC AND ANAEROBIC   Special Requests Blood Culture adequate volume  Final   Culture   Final    NO GROWTH 1 DAY Performed  at [facility], [address] [place], [place] [address]    Report Status PENDING  Incomplete  MRSA PCR Screening     Status: None   Collection Time: 04/23/18  4:50 PM  Result Value Ref Range Status   MRSA by PCR NEGATIVE NEGATIVE Final    Comment:        The GeneXpert MRSA Assay (FDA approved for NASAL specimens only), is one component of a comprehensive MRSA colonization surveillance program. It is not intended to diagnose MRSA infection nor to guide or monitor treatment for MRSA infections. Performed at [facility], [address] [place], [place] [address]      Scheduled Meds: . enoxaparin (LOVENOX) injection  40 mg Subcutaneous Q24H  . ondansetron (ZOFRAN) IV  4 mg Intravenous Q6H  . pantoprazole  40 mg Oral Daily  . sertraline  150 mg Oral Daily  . traZODone  300 mg Oral QHS   Continuous Infusions: . sodium chloride 125 mL/hr at 04/24/18 1000  . meropenem (MERREM) IV Stopped (04/24/18 0556)    Procedures/Studies: Dg Chest 2 View  Result Date: 04/23/2018 CLINICAL DATA:   Lung carcinoma, vomiting and fever. EXAM: CHEST - 2 VIEW COMPARISON:  03/21/2018 FINDINGS: Stable appearance of left-sided Port-A-Cath with catheter tip in the SVC. Right-sided suprahilar lung mass shows decreased prominence by chest x-ray since December. Stable underlying chronic emphysematous lung disease. Bibasilar atelectasis and scarring present. No overt airspace consolidation, edema or pleural fluid identified. No pneumothorax. Visualized bony structures are unremarkable. IMPRESSION: No acute process identified. The right-sided suprahilar lung mass shows decreased prominence by chest x-ray since December. Electronically Signed   By: Aletta Edouard M.D.   On: 04/23/2018 09:28   Ct Angio Chest Pe W Or Wo Contrast  Result Date: 04/23/2018 CLINICAL DATA:  Complex chest pain, shortness of breath, non-small cell lung cancer EXAM: CT ANGIOGRAPHY CHEST WITH CONTRAST TECHNIQUE: Multidetector CT imaging of the chest was performed using the standard protocol during bolus administration of intravenous contrast. Multiplanar CT image reconstructions and MIPs were obtained to evaluate the vascular anatomy. CONTRAST:  101mL ISOVUE-370 IOPAMIDOL (ISOVUE-370) INJECTION 76% IV COMPARISON:  CT chest 02/23/2018, PET-CT 03/11/2018 FINDINGS: Cardiovascular: Aorta normal caliber without aneurysm or dissection. No pericardial effusion. Pulmonary arteries well opacified and patent. No evidence of pulmonary embolism. Mediastinum/Nodes: Base of cervical region normal appearance. Diffuse infiltration of RIGHT hilum, subcarinal region, and inferior RIGHT paratracheal region by tumor/confluent adenopathy. Tumor surrounds the RIGHT mainstem bronchus, origin of the RIGHT upper lobe bronchus, and the proximal bronchus intermedius. Tumor encompasses the midthoracic esophagus the inferior and posterior margins of the proximal LEFT mainstem bronchus, and abuts the RIGHT lateral aspect of the descending thoracic aorta. Tumor infiltration  extends cranially in the RIGHT paratracheal region, and extends posteriorly into the RIGHT paraspinal region. Question confluent RIGHT hilar adenopathy versus tumor. 7 mm short axis precarinal node image 37. No additional adenopathy. Lungs/Pleura: Emphysematous changes. Peribronchial thickening with interseptal thickening throughout the RIGHT upper lobe. Scattered bullous changes. Progressive cavitation of tumor in the superior segment of the RIGHT lower lobe adjacent to the hilum, overall size little changed at 2.7 x 2.4 cm. Minimal dependent atelectasis in the posterior lower lobes. Question small nodule versus atelectasis at lateral sulcus LEFT lung base 5 mm diameter image 120. Upper Abdomen: Probable splenule adjacent to splenic hilum. Remaining visualized upper abdomen unremarkable. Musculoskeletal: Diffuse osseous demineralization. No acute bone lesions. Review of the MIP images confirms the above findings. IMPRESSION: No evidence of pulmonary embolism.  Progressive cavitation of RIGHT perihilar tumor though overall size appears stable. Extensive underlying bronchitic and bullous emphysematous changes with persistent interseptal thickening, especially in RIGHT upper lobe. Persistent extensive tumor/confluent adenopathy invading the RIGHT hilum and mediastinum, appears little changed in size versus prior exam, with tumor seen extending around the RIGHT mainstem bronchus, RIGHT upper lobe bronchus, proximal bronchus intermedius, esophagus, and adjacent to the proximal LEFT mainstem bronchus in addition to extending into the RIGHT paraspinal region. Emphysema (ICD10-J43.9). Electronically Signed   By: Lavonia Dana M.D.   On: 04/23/2018 19:12    Orson Eva, DO  [facility] Pager [phone]  If 7PM-7AM, please contact night-coverage [email or web address] Password [email or web address] 04/24/2018, 12:05 PM   LOS: 1 day

## 2018-04-24 NOTE — Discharge Summary (Signed)
Physician Discharge Summary  GENISIS SONNIER EQA:[id number] DOB: Jan 06, 1966 DOA: 04/23/2018  PCP: [name], MD  Admit date: 04/23/2018 Discharge date: 04/25/2018  Admitted From: Home Disposition:  Home   Recommendations for Outpatient Follow-up:  1. Follow up with PCP in 1-2 weeks 2. Please obtain BMP/CBC in one week    Discharge Condition: Stable CODE STATUS: FULL Diet recommendation: regular   Brief/Interim Summary: 53 y.o.femalewith medical history ofnon-small cell lung carcinoma diagnosed on 12/2/2019after EBUS, depression, anxiety presented with 1 day history of nausea, vomiting, and loose stools. The patient recently started on paclitaxel and carboplatin on 04/07/2018 for her lung cancer. Her last dose was on 04/22/2018. She had innumerable episodes of emesis on the evening of 04/22/2018 without any blood. She had a temperature of 100.5 F. She also has an associated headache but denies any visual disturbance, neck pain, focal extremity weakness. She has had anterior chest wall pain since November intermittently. She has been treated with hydrocodone. She states that her chest pain is sharp and intermittent that is worse with palpation and certain movement. The patient denies any recent sick contacts, eating unusual or raw food, or any recent travels. She had 2 episodes of loose stools without any hematochezia or melena. She denies any abdominal pain, dysuria, hematuria. There is no pain any rashes or synovitis. Upon presentation, the patient was noted to have a temperature of 99.8 F with hypotension. Her blood pressure in the ED was as low as 76/53. Oxygen saturation was 94-95% on room air. BMP and LFTs were essentially unremarkable. CBC showed WBC 7.8 with hemoglobin 14.3 and platelets 189,000. Lactic acid was 0.96. Influenza PCR was negative. UA showed 0-5 WBC. Chest x-ray showed right suprahilar mass without any acute findings. The patient was started  ceftriaxone after blood cultures were obtained.  Discharge Diagnoses:  Sepsis -due to aspiration/postobstructive pneumonia -Patient presented with fever and hypotension -Patient had CT chest 02/23/2018 that showed postobstructive pneumonia -Start vancomycin and meropenem pending culture data -d/c vanco -home with amox/clav x 4 more days to finish 1 week -Lactic acid 0.96 -Check procalcitonin--1.54 -Continue IV fluids -Influenza PCR negative -Check viral respiratory panel--neg -DC home with Augmentin x4 additional days to finish 1 week of therapy  Anterior chest wall pain -Cycle troponins neg x 3 -Continue home dose hydrocodone -CT angiogram chest--neg PE; progressive cavitation right perihilar tumor with stable size.  Extensive bronchitic and bullous emphysema changes.  Persistent extensive tumor and confluent lymphadenopathy in the right hilum and right mediastinum without any changes in size.  Tumor wrapping around the right mainstem and right upper lobe bronchus.  Intractable nausea and vomiting -Start Zofran around-the-clock -due to viral gastroenteritis and chemo induced -initially npo and gradually advanced -IV fluids -diet advanced which pt tolerated -Continue soft diet after discharge  Depression/anxiety -Continue home dose alprazolam and Zoloft  Seizure-like activity -Patient has history of PNES -Monitor clinically  Bacteriuria -Clinical significance unclear since the patient is asymptomatic -No significant pyuria  Discharge Instructions   Allergies as of 04/24/2018      Reactions   Levaquin [levofloxacin] Other (See Comments)   Myalgia   Codeine Nausea And Vomiting      Medication List    TAKE these medications   ALPRAZolam 1 MG tablet Commonly known as:  XANAX TAKE 1/2 TO 1 TABLET TWICE A DAY FOR ANXIETY What changed:    how much to take  how to take this  when to take this   CARBOPLATIN IV Inject into  the vein once a week.     HYDROcodone-acetaminophen 5-325 MG tablet Commonly known as:  NORCO/VICODIN Take 1 tablet by mouth every 4 (four) hours as needed for moderate pain.   lidocaine-prilocaine cream Commonly known as:  EMLA Apply small amount to port site one hour prior to appointment. Cover with plastic wrap.   ondansetron 8 MG disintegrating tablet Commonly known as:  ZOFRAN ODT Take 1 tablet (8 mg total) by mouth every 8 (eight) hours as needed for nausea or vomiting (may cause constipation).   PACLITAXEL IV Inject 78 mg into the vein once a week.   PRILOSEC OTC 20 MG tablet Generic drug:  omeprazole Take 20 mg by mouth daily.   prochlorperazine 10 MG tablet Commonly known as:  COMPAZINE Take 1 tablet (10 mg total) by mouth every 6 (six) hours as needed (Nausea or vomiting).   sertraline 100 MG tablet Commonly known as:  ZOLOFT TAKE 1 AND 1/2 TABLETS ONCE DAILY What changed:    how much to take  how to take this  when to take this  additional instructions   traZODone 100 MG tablet Commonly known as:  DESYREL Take 3 tablets (300 mg total) by mouth at bedtime.       Allergies  Allergen Reactions  . Levaquin [Levofloxacin] Other (See Comments)    Myalgia  . Codeine Nausea And Vomiting    Consultations:  none   Procedures/Studies: Dg Chest 2 View  Result Date: 04/23/2018 CLINICAL DATA:  Lung carcinoma, vomiting and fever. EXAM: CHEST - 2 VIEW COMPARISON:  03/21/2018 FINDINGS: Stable appearance of left-sided Port-A-Cath with catheter tip in the SVC. Right-sided suprahilar lung mass shows decreased prominence by chest x-ray since December. Stable underlying chronic emphysematous lung disease. Bibasilar atelectasis and scarring present. No overt airspace consolidation, edema or pleural fluid identified. No pneumothorax. Visualized bony structures are unremarkable. IMPRESSION: No acute process identified. The right-sided suprahilar lung mass shows decreased prominence by chest x-ray  since December. Electronically Signed   By: Aletta Edouard M.D.   On: 04/23/2018 09:28   Ct Angio Chest Pe W Or Wo Contrast  Result Date: 04/23/2018 CLINICAL DATA:  Complex chest pain, shortness of breath, non-small cell lung cancer EXAM: CT ANGIOGRAPHY CHEST WITH CONTRAST TECHNIQUE: Multidetector CT imaging of the chest was performed using the standard protocol during bolus administration of intravenous contrast. Multiplanar CT image reconstructions and MIPs were obtained to evaluate the vascular anatomy. CONTRAST:  17mL ISOVUE-370 IOPAMIDOL (ISOVUE-370) INJECTION 76% IV COMPARISON:  CT chest 02/23/2018, PET-CT 03/11/2018 FINDINGS: Cardiovascular: Aorta normal caliber without aneurysm or dissection. No pericardial effusion. Pulmonary arteries well opacified and patent. No evidence of pulmonary embolism. Mediastinum/Nodes: Base of cervical region normal appearance. Diffuse infiltration of RIGHT hilum, subcarinal region, and inferior RIGHT paratracheal region by tumor/confluent adenopathy. Tumor surrounds the RIGHT mainstem bronchus, origin of the RIGHT upper lobe bronchus, and the proximal bronchus intermedius. Tumor encompasses the midthoracic esophagus the inferior and posterior margins of the proximal LEFT mainstem bronchus, and abuts the RIGHT lateral aspect of the descending thoracic aorta. Tumor infiltration extends cranially in the RIGHT paratracheal region, and extends posteriorly into the RIGHT paraspinal region. Question confluent RIGHT hilar adenopathy versus tumor. 7 mm short axis precarinal node image 37. No additional adenopathy. Lungs/Pleura: Emphysematous changes. Peribronchial thickening with interseptal thickening throughout the RIGHT upper lobe. Scattered bullous changes. Progressive cavitation of tumor in the superior segment of the RIGHT lower lobe adjacent to the hilum, overall size little changed at 2.7 x 2.4  cm. Minimal dependent atelectasis in the posterior lower lobes. Question small  nodule versus atelectasis at lateral sulcus LEFT lung base 5 mm diameter image 120. Upper Abdomen: Probable splenule adjacent to splenic hilum. Remaining visualized upper abdomen unremarkable. Musculoskeletal: Diffuse osseous demineralization. No acute bone lesions. Review of the MIP images confirms the above findings. IMPRESSION: No evidence of pulmonary embolism. Progressive cavitation of RIGHT perihilar tumor though overall size appears stable. Extensive underlying bronchitic and bullous emphysematous changes with persistent interseptal thickening, especially in RIGHT upper lobe. Persistent extensive tumor/confluent adenopathy invading the RIGHT hilum and mediastinum, appears little changed in size versus prior exam, with tumor seen extending around the RIGHT mainstem bronchus, RIGHT upper lobe bronchus, proximal bronchus intermedius, esophagus, and adjacent to the proximal LEFT mainstem bronchus in addition to extending into the RIGHT paraspinal region. Emphysema (ICD10-J43.9). Electronically Signed   By: Lavonia Dana M.D.   On: 04/23/2018 19:12        Discharge Exam: Vitals:   04/24/18 1118 04/24/18 1429  BP:  105/62  Pulse:  (!) 58  Resp: 15 16  Temp: 98.8 F (37.1 C) 99 F (37.2 C)  SpO2:  99%   Vitals:   04/24/18 0900 04/24/18 1000 04/24/18 1118 04/24/18 1429  BP: (!) 106/45 112/66  105/62  Pulse: 71 67  (!) 58  Resp: 18 (!) 9 15 16   Temp:   98.8 F (37.1 C) 99 F (37.2 C)  TempSrc:   Oral Oral  SpO2: 94% 93%  99%  Weight:      Height:        General: Pt is alert, awake, not in acute distress Cardiovascular: RRR, S1/S2 +, no rubs, no gallops Respiratory: Bibasilar rales.  No wheezing.  Good air movement Abdominal: Soft, NT, ND, bowel sounds + Extremities: no edema, no cyanosis   The results of significant diagnostics from this hospitalization (including imaging, microbiology, ancillary and laboratory) are listed below for reference.    Significant Diagnostic  Studies: Dg Chest 2 View  Result Date: 04/23/2018 CLINICAL DATA:  Lung carcinoma, vomiting and fever. EXAM: CHEST - 2 VIEW COMPARISON:  03/21/2018 FINDINGS: Stable appearance of left-sided Port-A-Cath with catheter tip in the SVC. Right-sided suprahilar lung mass shows decreased prominence by chest x-ray since December. Stable underlying chronic emphysematous lung disease. Bibasilar atelectasis and scarring present. No overt airspace consolidation, edema or pleural fluid identified. No pneumothorax. Visualized bony structures are unremarkable. IMPRESSION: No acute process identified. The right-sided suprahilar lung mass shows decreased prominence by chest x-ray since December. Electronically Signed   By: Aletta Edouard M.D.   On: 04/23/2018 09:28   Ct Angio Chest Pe W Or Wo Contrast  Result Date: 04/23/2018 CLINICAL DATA:  Complex chest pain, shortness of breath, non-small cell lung cancer EXAM: CT ANGIOGRAPHY CHEST WITH CONTRAST TECHNIQUE: Multidetector CT imaging of the chest was performed using the standard protocol during bolus administration of intravenous contrast. Multiplanar CT image reconstructions and MIPs were obtained to evaluate the vascular anatomy. CONTRAST:  44mL ISOVUE-370 IOPAMIDOL (ISOVUE-370) INJECTION 76% IV COMPARISON:  CT chest 02/23/2018, PET-CT 03/11/2018 FINDINGS: Cardiovascular: Aorta normal caliber without aneurysm or dissection. No pericardial effusion. Pulmonary arteries well opacified and patent. No evidence of pulmonary embolism. Mediastinum/Nodes: Base of cervical region normal appearance. Diffuse infiltration of RIGHT hilum, subcarinal region, and inferior RIGHT paratracheal region by tumor/confluent adenopathy. Tumor surrounds the RIGHT mainstem bronchus, origin of the RIGHT upper lobe bronchus, and the proximal bronchus intermedius. Tumor encompasses the midthoracic esophagus the inferior and posterior  margins of the proximal LEFT mainstem bronchus, and abuts the RIGHT  lateral aspect of the descending thoracic aorta. Tumor infiltration extends cranially in the RIGHT paratracheal region, and extends posteriorly into the RIGHT paraspinal region. Question confluent RIGHT hilar adenopathy versus tumor. 7 mm short axis precarinal node image 37. No additional adenopathy. Lungs/Pleura: Emphysematous changes. Peribronchial thickening with interseptal thickening throughout the RIGHT upper lobe. Scattered bullous changes. Progressive cavitation of tumor in the superior segment of the RIGHT lower lobe adjacent to the hilum, overall size little changed at 2.7 x 2.4 cm. Minimal dependent atelectasis in the posterior lower lobes. Question small nodule versus atelectasis at lateral sulcus LEFT lung base 5 mm diameter image 120. Upper Abdomen: Probable splenule adjacent to splenic hilum. Remaining visualized upper abdomen unremarkable. Musculoskeletal: Diffuse osseous demineralization. No acute bone lesions. Review of the MIP images confirms the above findings. IMPRESSION: No evidence of pulmonary embolism. Progressive cavitation of RIGHT perihilar tumor though overall size appears stable. Extensive underlying bronchitic and bullous emphysematous changes with persistent interseptal thickening, especially in RIGHT upper lobe. Persistent extensive tumor/confluent adenopathy invading the RIGHT hilum and mediastinum, appears little changed in size versus prior exam, with tumor seen extending around the RIGHT mainstem bronchus, RIGHT upper lobe bronchus, proximal bronchus intermedius, esophagus, and adjacent to the proximal LEFT mainstem bronchus in addition to extending into the RIGHT paraspinal region. Emphysema (ICD10-J43.9). Electronically Signed   By: Lavonia Dana M.D.   On: 04/23/2018 19:12     Microbiology: Recent Results (from the past 240 hour(s))  Blood Culture (routine x 2)     Status: None (Preliminary result)   Collection Time: 04/23/18  9:02 AM  Result Value Ref Range Status    Specimen Description   Final    BLOOD LEFT FOREARM BOTTLES DRAWN AEROBIC AND ANAEROBIC   Special Requests Blood Culture adequate volume  Final   Culture   Final    NO GROWTH 1 DAY Performed at [facility], [address], [place], [place] [address]    Report Status PENDING  Incomplete  Blood Culture (routine x 2)     Status: None (Preliminary result)   Collection Time: 04/23/18  9:07 AM  Result Value Ref Range Status   Specimen Description   Final    BLOOD RIGHT HAND BOTTLES DRAWN AEROBIC AND ANAEROBIC   Special Requests Blood Culture adequate volume  Final   Culture   Final    NO GROWTH 1 DAY Performed at [facility], [address], [place], [place] [address]    Report Status PENDING  Incomplete  Urine culture     Status: Abnormal (Preliminary result)   Collection Time: 04/23/18 11:43 AM  Result Value Ref Range Status   Specimen Description   Final    URINE, CLEAN CATCH Performed at [facility], [address] [place], [place] [address]    Special Requests   Final    NONE Performed at [facility], [address] [place], [place] [address]    Culture >=100,000 COLONIES/mL KLEBSIELLA PNEUMONIAE (A)  Final   Report Status PENDING  Incomplete  MRSA PCR Screening     Status: None   Collection Time: 04/23/18  4:50 PM  Result Value Ref Range Status   MRSA by PCR NEGATIVE NEGATIVE Final    Comment:        The GeneXpert MRSA Assay (FDA approved for NASAL specimens only), is one component of a comprehensive MRSA colonization surveillance program. It is not intended to diagnose MRSA infection  nor to guide or monitor treatment for MRSA infections. Performed at [facility], [address], [place], [place] [address]      Labs: Basic Metabolic Panel: Recent Labs  Lab 04/22/18 0825 04/23/18 0902 04/23/18 1811 04/24/18 0433  NA 136 136 138 138  K 4.1 3.6 3.3* 3.2*  CL 102 105 112* 110  CO2 25 24 22 22   GLUCOSE 110* 138* 104* 88  BUN 10 11 8 7   CREATININE 0.61 0.52  0.36* 0.35*  CALCIUM 9.3 8.4* 7.7* 7.9*   Liver Function Tests: Recent Labs  Lab 04/22/18 0825 04/23/18 0902 04/23/18 1811  AST 20 20 21   ALT 21 22 22   ALKPHOS 87 78 64  BILITOT 0.6 0.5 0.4  PROT 7.0 6.3* 5.1*  ALBUMIN 3.7 3.3* 2.6*   No results for input(s): LIPASE, AMYLASE in the last 168 hours. No results for input(s): AMMONIA in the last 168 hours. CBC: Recent Labs  Lab 04/22/18 0825 04/23/18 0902 04/23/18 1811 04/24/18 0433  WBC 6.3 7.8 5.4 5.1  NEUTROABS 4.7 7.4 4.9  --   HGB 15.3* 14.3 12.4 12.8  HCT 47.6* 43.6 38.5 39.9  MCV 90.8 89.3 90.8 90.7  PLT 231 189 157 156   Cardiac Enzymes: Recent Labs  Lab 04/23/18 1811 04/23/18 2251 04/24/18 0433  TROPONINI <0.03 <0.03 <0.03   BNP: Invalid input(s): POCBNP CBG: No results for input(s): GLUCAP in the last 168 hours.  Time coordinating discharge:  36 minutes  Signed:  Orson Eva, DO [facility] Pager: [phone] 04/24/2018, 6:21 PM

## 2018-04-25 LAB — URINE CULTURE

## 2018-04-25 LAB — RESPIRATORY PANEL BY PCR
Adenovirus: NOT DETECTED
Bordetella pertussis: NOT DETECTED
CORONAVIRUS NL63-RVPPCR: NOT DETECTED
CORONAVIRUS OC43-RVPPCR: NOT DETECTED
Chlamydophila pneumoniae: NOT DETECTED
Coronavirus 229E: NOT DETECTED
Coronavirus HKU1: NOT DETECTED
Influenza A: NOT DETECTED
Influenza B: NOT DETECTED
Metapneumovirus: NOT DETECTED
Mycoplasma pneumoniae: NOT DETECTED
PARAINFLUENZA VIRUS 1-RVPPCR: NOT DETECTED
Parainfluenza Virus 2: NOT DETECTED
Parainfluenza Virus 3: NOT DETECTED
Parainfluenza Virus 4: NOT DETECTED
Respiratory Syncytial Virus: NOT DETECTED
Rhinovirus / Enterovirus: NOT DETECTED

## 2018-04-25 LAB — CBC
HCT: 38.9 % (ref 36.0–46.0)
Hemoglobin: 12.6 g/dL (ref 12.0–15.0)
MCH: 29.7 pg (ref 26.0–34.0)
MCHC: 32.4 g/dL (ref 30.0–36.0)
MCV: 91.7 fL (ref 80.0–100.0)
Platelets: 137 10*3/uL — ABNORMAL LOW (ref 150–400)
RBC: 4.24 MIL/uL (ref 3.87–5.11)
RDW: 12.6 % (ref 11.5–15.5)
WBC: 5 10*3/uL (ref 4.0–10.5)
nRBC: 0 % (ref 0.0–0.2)

## 2018-04-25 LAB — BASIC METABOLIC PANEL
Anion gap: 8 (ref 5–15)
BUN: 5 mg/dL — ABNORMAL LOW (ref 6–20)
CO2: 22 mmol/L (ref 22–32)
Calcium: 8 mg/dL — ABNORMAL LOW (ref 8.9–10.3)
Chloride: 109 mmol/L (ref 98–111)
Creatinine, Ser: 0.4 mg/dL — ABNORMAL LOW (ref 0.44–1.00)
GFR calc Af Amer: >60 mL/min (ref >60)
Glucose, Bld: 80 mg/dL (ref 70–99)
Potassium: 2.8 mmol/L — ABNORMAL LOW (ref 3.5–5.1)
Sodium: 139 mmol/L (ref 135–145)

## 2018-04-25 MED ORDER — POTASSIUM CHLORIDE 20 MEQ/15ML (10%) PO SOLN
40.0000 meq | ORAL | Status: AC
  Administered 2018-04-25 (×2): 40 meq via ORAL
  Filled 2018-04-25 (×2): qty 30

## 2018-04-25 MED ORDER — AMOXICILLIN-POT CLAVULANATE 600-42.9 MG/5ML PO SUSR
800.0000 mg | Freq: Two times a day (BID) | ORAL | Status: DC
  Filled 2018-04-25 (×4): qty 6.7

## 2018-04-25 MED ORDER — AMOXICILLIN-POT CLAVULANATE 400-57 MG/5ML PO SUSR: 800.0000 mg | Freq: Two times a day (BID) | ORAL | 0 refills | Status: DC

## 2018-04-25 NOTE — Progress Notes (Signed)
Patient alert and oriented x4. Patient currently has no complaints of pain, shortness of breath, chest pain, dizziness, nausea or vomiting. Patient up out of bed, ambulatory in room with supervision, gait steady. Patient tolerated medications, breakfast and lunch well, appetite fair. Husband at bedside, emotional support provided to both. IV's removed with no complications. Discharge instructions along with appointment follow ups and medication education done with patient and spouse. Both expressed full understanding of instructions and education. Patient discharged home with all belongings via car (husband driving patient home).

## 2018-04-28 ENCOUNTER — Encounter: Payer: Self-pay | Admitting: Family Medicine

## 2018-04-28 LAB — CULTURE, BLOOD (ROUTINE X 2)
Culture: NO GROWTH
Culture: NO GROWTH
Special Requests: ADEQUATE
Special Requests: ADEQUATE

## 2018-04-29 ENCOUNTER — Inpatient Hospital Stay (HOSPITAL_COMMUNITY): Payer: 59

## 2018-04-29 ENCOUNTER — Ambulatory Visit (HOSPITAL_COMMUNITY): Payer: 59

## 2018-04-29 ENCOUNTER — Encounter (HOSPITAL_COMMUNITY): Payer: Self-pay

## 2018-04-29 VITALS — BP 91/65 | HR 86 | Temp 98.4°F | Resp 18 | Wt 129.8 lb

## 2018-04-29 DIAGNOSIS — R112 Nausea with vomiting, unspecified: Secondary | ICD-10-CM | POA: Insufficient documentation

## 2018-04-29 DIAGNOSIS — Z5111 Encounter for antineoplastic chemotherapy: Secondary | ICD-10-CM | POA: Diagnosis not present

## 2018-04-29 DIAGNOSIS — R1310 Dysphagia, unspecified: Secondary | ICD-10-CM | POA: Insufficient documentation

## 2018-04-29 DIAGNOSIS — C3431 Malignant neoplasm of lower lobe, right bronchus or lung: Secondary | ICD-10-CM

## 2018-04-29 DIAGNOSIS — Z79899 Other long term (current) drug therapy: Secondary | ICD-10-CM | POA: Diagnosis not present

## 2018-04-29 DIAGNOSIS — R531 Weakness: Secondary | ICD-10-CM | POA: Diagnosis not present

## 2018-04-29 DIAGNOSIS — R0789 Other chest pain: Secondary | ICD-10-CM | POA: Insufficient documentation

## 2018-04-29 DIAGNOSIS — F17210 Nicotine dependence, cigarettes, uncomplicated: Secondary | ICD-10-CM | POA: Insufficient documentation

## 2018-04-29 DIAGNOSIS — C3491 Malignant neoplasm of unspecified part of right bronchus or lung: Secondary | ICD-10-CM | POA: Insufficient documentation

## 2018-04-29 DIAGNOSIS — Z90710 Acquired absence of both cervix and uterus: Secondary | ICD-10-CM | POA: Diagnosis not present

## 2018-04-29 LAB — COMPREHENSIVE METABOLIC PANEL WITH GFR
ALT: 30 U/L (ref 0–44)
AST: 24 U/L (ref 15–41)
Albumin: 3.3 g/dL — ABNORMAL LOW (ref 3.5–5.0)
Alkaline Phosphatase: 79 U/L (ref 38–126)
Anion gap: 9 (ref 5–15)
BUN: 8 mg/dL (ref 6–20)
CO2: 27 mmol/L (ref 22–32)
Calcium: 9 mg/dL (ref 8.9–10.3)
Chloride: 102 mmol/L (ref 98–111)
Creatinine, Ser: 0.45 mg/dL (ref 0.44–1.00)
GFR calc Af Amer: >60 mL/min
GFR calc non Af Amer: >60 mL/min
Glucose, Bld: 97 mg/dL (ref 70–99)
Potassium: 3.1 mmol/L — ABNORMAL LOW (ref 3.5–5.1)
Sodium: 138 mmol/L (ref 135–145)
Total Bilirubin: 1 mg/dL (ref 0.3–1.2)
Total Protein: 6.1 g/dL — ABNORMAL LOW (ref 6.5–8.1)

## 2018-04-29 LAB — CBC WITH DIFFERENTIAL/PLATELET
Abs Immature Granulocytes: 0.07 10*3/uL (ref 0.00–0.07)
Basophils Absolute: 0 10*3/uL (ref 0.0–0.1)
Basophils Relative: 0 %
Eosinophils Absolute: 0 10*3/uL (ref 0.0–0.5)
Eosinophils Relative: 1 %
HCT: 44.8 % (ref 36.0–46.0)
Hemoglobin: 14.9 g/dL (ref 12.0–15.0)
Immature Granulocytes: 1 %
Lymphocytes Relative: 17 %
Lymphs Abs: 1.4 10*3/uL (ref 0.7–4.0)
MCH: 30 pg (ref 26.0–34.0)
MCHC: 33.3 g/dL (ref 30.0–36.0)
MCV: 90.3 fL (ref 80.0–100.0)
Monocytes Absolute: 0.5 10*3/uL (ref 0.1–1.0)
Monocytes Relative: 6 %
Neutro Abs: 6.1 10*3/uL (ref 1.7–7.7)
Neutrophils Relative %: 75 %
Platelets: 176 10*3/uL (ref 150–400)
RBC: 4.96 MIL/uL (ref 3.87–5.11)
RDW: 12.9 % (ref 11.5–15.5)
WBC: 8.1 10*3/uL (ref 4.0–10.5)
nRBC: 0 % (ref 0.0–0.2)

## 2018-04-29 MED ORDER — PROCHLORPERAZINE MALEATE 10 MG PO TABS: 10.0000 mg | ORAL_TABLET | Freq: Four times a day (QID) | ORAL | 1 refills | Status: AC | PRN

## 2018-04-29 MED ORDER — SODIUM CHLORIDE 0.9 % IV SOLN
Freq: Once | INTRAVENOUS | Status: AC
  Administered 2018-04-29: 11:00:00 via INTRAVENOUS

## 2018-04-29 MED ORDER — SODIUM CHLORIDE 0.9 % IV SOLN
209.4000 mg | Freq: Once | INTRAVENOUS | Status: AC
  Administered 2018-04-29: 210 mg via INTRAVENOUS
  Filled 2018-04-29: qty 21

## 2018-04-29 MED ORDER — SODIUM CHLORIDE 0.9 % IV SOLN
20.0000 mg | Freq: Once | INTRAVENOUS | Status: AC
  Administered 2018-04-29: 20 mg via INTRAVENOUS
  Filled 2018-04-29: qty 2

## 2018-04-29 MED ORDER — POTASSIUM CHLORIDE CRYS ER 20 MEQ PO TBCR
40.0000 meq | EXTENDED_RELEASE_TABLET | Freq: Once | ORAL | Status: AC
  Administered 2018-04-29: 40 meq via ORAL
  Filled 2018-04-29: qty 2

## 2018-04-29 MED ORDER — HEPARIN SOD (PORK) LOCK FLUSH 100 UNIT/ML IV SOLN
500.0000 [IU] | Freq: Once | INTRAVENOUS | Status: AC | PRN
  Administered 2018-04-29: 500 [IU]
  Filled 2018-04-29: qty 5

## 2018-04-29 MED ORDER — SODIUM CHLORIDE 0.9 % IV SOLN
INTRAVENOUS | Status: AC
  Administered 2018-04-29: 10:00:00 via INTRAVENOUS

## 2018-04-29 MED ORDER — FAMOTIDINE IN NACL 20-0.9 MG/50ML-% IV SOLN
20.0000 mg | Freq: Once | INTRAVENOUS | Status: AC
  Administered 2018-04-29: 20 mg via INTRAVENOUS
  Filled 2018-04-29: qty 50

## 2018-04-29 MED ORDER — LORAZEPAM 2 MG/ML IJ SOLN
INTRAMUSCULAR | Status: AC
  Filled 2018-04-29: qty 1

## 2018-04-29 MED ORDER — SODIUM CHLORIDE 0.9 % IV SOLN
45.0000 mg/m2 | Freq: Once | INTRAVENOUS | Status: AC
  Administered 2018-04-29: 78 mg via INTRAVENOUS
  Filled 2018-04-29: qty 13

## 2018-04-29 MED ORDER — LORAZEPAM 2 MG/ML IJ SOLN
1.0000 mg | Freq: Once | INTRAMUSCULAR | Status: AC
  Administered 2018-04-29: 1 mg via INTRAVENOUS

## 2018-04-29 MED ORDER — DIPHENHYDRAMINE HCL 50 MG/ML IJ SOLN
50.0000 mg | Freq: Once | INTRAMUSCULAR | Status: AC
  Administered 2018-04-29: 50 mg via INTRAVENOUS
  Filled 2018-04-29: qty 1

## 2018-04-29 MED ORDER — PALONOSETRON HCL INJECTION 0.25 MG/5ML
0.2500 mg | Freq: Once | INTRAVENOUS | Status: AC
  Administered 2018-04-29: 0.25 mg via INTRAVENOUS
  Filled 2018-04-29: qty 5

## 2018-04-29 MED ORDER — SODIUM CHLORIDE 0.9% FLUSH
10.0000 mL | INTRAVENOUS | Status: DC | PRN
  Administered 2018-04-29: 10 mL
  Filled 2018-04-29: qty 10

## 2018-04-29 NOTE — Progress Notes (Signed)
Patient vomited times one, notified MD. Will give ativan per orders.

## 2018-04-29 NOTE — Progress Notes (Signed)
Nutrition follow-up:  Patient continues to loose weight and with decreased appetite.   ASSESSMENT:   53 year old female with stage III right lung cancer.  Past medical history of smoking.  Patient currently receiving chemotherapy and radiation therapy.    Met with patient and daughter during infusion today. Asked patient if ok for RD to speak with her for little bit and she agreed.  Noted recent hospital admission for sepsis pneumonia, nausea, vomiting, diarrhea (1/4-1/6).  Patient reports that appetite has been decreased especially during admission.  Reports that she has been trying to increase her calories.  Reports breakfast is usually grits with butter.  Also has been making a smoothie with fruits and vegetables and adding benecalorie (330 calorie, 7 g protein) about 2 times per day.  Drinks premier clear shake as does not really like the texture of "milky" shakes.  Has been eating mashed potatoes, ice cream, yogurt, jello.  Volume of food has been low, only taking few bites at times.  Reports breads hard to swallow due to radiation.     Nutrition Focused Physical Exam: deferred   Medications: prilosec, zofran, compazine   Labs: K 3.1   Anthropometrics:   Height: 65.5 inches Weight: 129 lb 12.8 oz UBW: 150 lb in June 2019 BMI: 21  14% weight loss in the last 7 months, significant   Estimated Energy Needs  Kcals: 1800-2100 calories/d Protein: 90-105 g/d Fluid: 1.8 L/d   NUTRITION DIAGNOSIS: Inadequate oral intake related to cancer and cancer related treatment side effects as evidenced by 14% weight loss in 7 months and poor appetite   INTERVENTION:  Discussed strategies to increase calories and protein.  Provided "High Calorie, HIgh protein recipes" from [employer].   Discussed alternative clear liquid shakes and provided samples and coupons (boost soothe, ensure clear, boost breeze) Encouraged small frequent snacks during the day. Discussed soft moist protein foods to easy  swallowing.  Handout provided.   Contact information provided Patient appreciative of information and agreeable to follow-up visit by RD   MONITORING, EVALUATION, GOAL: Patient will consume adequate calories to maintain weight during treatment   Next Visit: Jan 24 during infusion  Nimra Puccinelli B. Zenia Resides, [place], [place] Registered Dietitian [phone] (pager)

## 2018-04-29 NOTE — Patient Instructions (Signed)
[facility] Discharge Instructions for Patients Receiving Chemotherapy  Today you received the following chemotherapy agents taxol and carboplatin.    If you develop nausea and vomiting that is not controlled by your nausea medication, call the clinic.   BELOW ARE SYMPTOMS THAT SHOULD BE REPORTED IMMEDIATELY:  *FEVER GREATER THAN 100.5 F  *CHILLS WITH OR WITHOUT FEVER  NAUSEA AND VOMITING THAT IS NOT CONTROLLED WITH YOUR NAUSEA MEDICATION  *UNUSUAL SHORTNESS OF BREATH  *UNUSUAL BRUISING OR BLEEDING  TENDERNESS IN MOUTH AND THROAT WITH OR WITHOUT PRESENCE OF ULCERS  *URINARY PROBLEMS  *BOWEL PROBLEMS  UNUSUAL RASH Items with * indicate a potential emergency and should be followed up as soon as possible.  Feel free to call the clinic should you have any questions or concerns. The clinic phone number is [phone].  Please show the [place] at check-in to the Emergency Department and triage nurse.

## 2018-04-29 NOTE — Progress Notes (Signed)
1315 patient stated nausea was better.  Resting with family at side.  Patient stated she felt like it was the lunch smell that upset her stomach.  Warm and dry with no s/s of distress noted.     1410-patient tolerated treatment with no complaints voiced.  Stated nausea was better.  Patient warm and dry.  Port site clean and dry with good blood return noted before and after treatment.  Band aid applied.  VSS with discharge and left ambulatory with no s/s of distress noted.

## 2018-04-29 NOTE — Progress Notes (Signed)
5015 Discussed lab results and recent hospitalization with Dr. Delton Coombes as well as B/P of 78/52 today. Pt to receive 1 l NS over 1hr andPotassium 40 meq PO along with chemo tx today per MD

## 2018-05-02 ENCOUNTER — Other Ambulatory Visit: Payer: Self-pay

## 2018-05-02 MED ORDER — TRAZODONE HCL 100 MG PO TABS: 300.0000 mg | ORAL_TABLET | Freq: Every day | ORAL | 5 refills | Status: AC

## 2018-05-02 MED ORDER — SERTRALINE HCL 100 MG PO TABS: ORAL_TABLET | ORAL | 5 refills | Status: AC

## 2018-05-02 MED ORDER — ALPRAZOLAM 1 MG PO TABS: 1.0000 mg | ORAL_TABLET | Freq: Two times a day (BID) | ORAL | 5 refills | Status: DC

## 2018-05-05 ENCOUNTER — Other Ambulatory Visit (HOSPITAL_COMMUNITY): Payer: Self-pay | Admitting: *Deleted

## 2018-05-05 ENCOUNTER — Other Ambulatory Visit: Payer: Self-pay

## 2018-05-05 ENCOUNTER — Encounter (HOSPITAL_COMMUNITY): Payer: Self-pay | Admitting: *Deleted

## 2018-05-05 ENCOUNTER — Encounter (HOSPITAL_COMMUNITY): Payer: Self-pay | Admitting: Emergency Medicine

## 2018-05-05 ENCOUNTER — Emergency Department (HOSPITAL_COMMUNITY)
Admission: EM | Admit: 2018-05-05 | Discharge: 2018-05-05 | Disposition: A | Discharge status: Home / Self Care | Payer: 59 | Attending: Emergency Medicine | Admitting: Emergency Medicine

## 2018-05-05 DIAGNOSIS — R112 Nausea with vomiting, unspecified: Secondary | ICD-10-CM

## 2018-05-05 DIAGNOSIS — Z87891 Personal history of nicotine dependence: Secondary | ICD-10-CM | POA: Insufficient documentation

## 2018-05-05 DIAGNOSIS — D0221 Carcinoma in situ of right bronchus and lung: Secondary | ICD-10-CM | POA: Insufficient documentation

## 2018-05-05 DIAGNOSIS — Z79899 Other long term (current) drug therapy: Secondary | ICD-10-CM | POA: Insufficient documentation

## 2018-05-05 LAB — COMPREHENSIVE METABOLIC PANEL
ALK PHOS: 84 U/L (ref 38–126)
ALT: 44 U/L (ref 0–44)
AST: 40 U/L (ref 15–41)
Albumin: 3.5 g/dL (ref 3.5–5.0)
Anion gap: 17 — ABNORMAL HIGH (ref 5–15)
BUN: 16 mg/dL (ref 6–20)
CALCIUM: 9.1 mg/dL (ref 8.9–10.3)
CO2: 24 mmol/L (ref 22–32)
Chloride: 95 mmol/L — ABNORMAL LOW (ref 98–111)
Creatinine, Ser: 0.59 mg/dL (ref 0.44–1.00)
GFR calc Af Amer: >60 mL/min (ref >60)
GFR calc non Af Amer: >60 mL/min (ref >60)
Glucose, Bld: 101 mg/dL — ABNORMAL HIGH (ref 70–99)
Potassium: 2.7 mmol/L — CL (ref 3.5–5.1)
Sodium: 136 mmol/L (ref 135–145)
Total Bilirubin: 1.5 mg/dL — ABNORMAL HIGH (ref 0.3–1.2)
Total Protein: 6.4 g/dL — ABNORMAL LOW (ref 6.5–8.1)

## 2018-05-05 LAB — CBC WITH DIFFERENTIAL/PLATELET
Abs Immature Granulocytes: 0.04 10*3/uL (ref 0.00–0.07)
Basophils Absolute: 0 10*3/uL (ref 0.0–0.1)
Basophils Relative: 0 %
Eosinophils Absolute: 0.1 10*3/uL (ref 0.0–0.5)
Eosinophils Relative: 1 %
HCT: 46 % (ref 36.0–46.0)
HEMOGLOBIN: 15.6 g/dL — AB (ref 12.0–15.0)
Immature Granulocytes: 1 %
Lymphocytes Relative: 17 %
Lymphs Abs: 1.2 10*3/uL (ref 0.7–4.0)
MCH: 29.5 pg (ref 26.0–34.0)
MCHC: 33.9 g/dL (ref 30.0–36.0)
MCV: 87 fL (ref 80.0–100.0)
Monocytes Absolute: 0.4 10*3/uL (ref 0.1–1.0)
Monocytes Relative: 6 %
Neutro Abs: 5.2 10*3/uL (ref 1.7–7.7)
Neutrophils Relative %: 75 %
Platelets: 105 10*3/uL — ABNORMAL LOW (ref 150–400)
RBC: 5.29 MIL/uL — ABNORMAL HIGH (ref 3.87–5.11)
RDW: 13.3 % (ref 11.5–15.5)
WBC: 6.9 10*3/uL (ref 4.0–10.5)
nRBC: 0 % (ref 0.0–0.2)

## 2018-05-05 MED ORDER — FAMOTIDINE IN NACL 20-0.9 MG/50ML-% IV SOLN
20.0000 mg | Freq: Once | INTRAVENOUS | Status: AC
  Administered 2018-05-05: 20 mg via INTRAVENOUS
  Filled 2018-05-05: qty 50

## 2018-05-05 MED ORDER — SODIUM CHLORIDE 0.9 % IV BOLUS
1000.0000 mL | Freq: Once | INTRAVENOUS | Status: AC
  Administered 2018-05-05: 1000 mL via INTRAVENOUS

## 2018-05-05 MED ORDER — SCOPOLAMINE 1 MG/3DAYS TD PT72: 1.0000 | MEDICATED_PATCH | TRANSDERMAL | 12 refills | Status: AC

## 2018-05-05 MED ORDER — POTASSIUM CHLORIDE CRYS ER 20 MEQ PO TBCR: 20.0000 meq | EXTENDED_RELEASE_TABLET | Freq: Two times a day (BID) | ORAL | 0 refills | Status: AC

## 2018-05-05 MED ORDER — ONDANSETRON HCL 4 MG/2ML IJ SOLN
4.0000 mg | Freq: Once | INTRAMUSCULAR | Status: AC
  Administered 2018-05-05: 4 mg via INTRAVENOUS
  Filled 2018-05-05: qty 2

## 2018-05-05 MED ORDER — POTASSIUM CHLORIDE 10 MEQ/100ML IV SOLN
10.0000 meq | INTRAVENOUS | Status: AC
  Administered 2018-05-05 (×3): 10 meq via INTRAVENOUS
  Filled 2018-05-05 (×3): qty 100

## 2018-05-05 NOTE — Discharge Instructions (Addendum)
Prescription for potassium.  Increase fluids.  You will need your potassium rechecked next week.

## 2018-05-05 NOTE — ED Provider Notes (Signed)
[facility] EMERGENCY DEPARTMENT Provider Note   CSN: [id number] Arrival date & time: 05/05/18  1307     History   Chief Complaint Chief Complaint  Patient presents with  . Emesis    HPI Sharon Berg is a 53 y.o. female.  Nausea and vomiting for the past 24 hours.  Patient is currently being treated for lung cancer which was diagnosed in February 23, 2018.  She has been using Zofran at home with minimal success.  No fever, sweats, chills.  She feels she is dehydrated.  Severity of symptoms is moderate.  Nothing makes symptoms better or worse.     Past Medical History:  Diagnosis Date  . Anxiety   . Depression   . Lung cancer ([place])   . Migraine   . Pneumonia   . Seizures ([place])    stressed induced    Patient Active Problem List   Diagnosis Date Noted  . Acute lower UTI   . Sepsis due to undetermined organism ([place]) 04/23/2018  . 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 Intractable nausea and vomiting 04/23/2018  . Vomiting and diarrhea   . Primary lung cancer, right ([place])   . Cancer of right lung ([place]) 02/28/2018  . Seasonal affective disorder ([place]) 04/01/2017  . Seizure-like activity ([place]) 08/10/2016  . Impaired fasting glucose 11/11/2015  . Tension headache 12/18/2013  . 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 Anxiety and depression 07/30/2013    Past Surgical History:  Procedure Laterality Date  . ABDOMINAL HYSTERECTOMY  2000  . PORTACATH PLACEMENT Left 03/14/2018   Procedure: INSERTION PORT-A-CATH;  Surgeon: Aviva Signs, MD;  Location: [facility];  Service: General;  Laterality: Left;  Marland Kitchen VIDEO BRONCHOSCOPY WITH ENDOBRONCHIAL ULTRASOUND N/A 03/21/2018   Procedure: VIDEO BRONCHOSCOPY WITH ENDOBRONCHIAL ULTRASOUND;  Surgeon: Melrose Nakayama, MD;  Location: [place];  Service: Thoracic;  Laterality: N/A;     OB History   No obstetric history on file.      Home Medications    Prior to Admission medications   Medication Sig  Start Date End Date Taking? Authorizing Provider  ALPRAZolam Duanne Moron) 1 MG tablet Take 1 tablet (1 mg total) by mouth 2 (two) times daily. TAKE 1/2 TO 1 TABLET TWICE A DAY FOR ANXIETY 05/02/18  Yes [name], MD  CARBOPLATIN IV Inject into the vein once a week.   Yes [provider]  HYDROcodone-acetaminophen (NORCO/VICODIN) 5-325 MG tablet Take 1 tablet by mouth every 4 (four) hours as needed for moderate pain. 04/22/18  Yes Lockamy, Randi L, NP-C  lidocaine-prilocaine (EMLA) cream Apply small amount to port site one hour prior to appointment. Cover with plastic wrap. 03/29/18  Yes [provider]  omeprazole (PRILOSEC OTC) 20 MG tablet Take 20 mg by mouth daily.  03/28/18 06/26/18 Yes [provider]  ondansetron (ZOFRAN ODT) 8 MG disintegrating tablet Take 1 tablet (8 mg total) by mouth every 8 (eight) hours as needed for nausea or vomiting (may cause constipation). 04/22/18  Yes Lockamy, Randi L, NP-C  PACLITAXEL IV Inject 78 mg into the vein once a week.   Yes [provider]  prochlorperazine (COMPAZINE) 10 MG tablet Take 1 tablet (10 mg total) by mouth every 6 (six) hours as needed (Nausea or vomiting). 04/29/18  Yes Derek [name], MD  sertraline (ZOLOFT) 100 MG tablet TAKE 1 AND 1/2 TABLETS ONCE DAILY 05/02/18  Yes [name], MD  traZODone (DESYREL) 100 MG tablet Take 3 tablets (  300 mg total) by mouth at bedtime. 05/02/18  Yes [name], MD  potassium chloride SA (K-DUR,KLOR-CON) 20 MEQ tablet Take 1 tablet (20 mEq total) by mouth 2 (two) times daily. 05/05/18   Nat Christen, MD  scopolamine (TRANSDERM-SCOP) 1 MG/3DAYS Place 1 patch (1.5 mg total) onto the skin every 3 (three) days. Patient not taking: Reported on 05/05/2018 05/05/18   Derek [name], MD    Family History Family History  Problem Relation Age of Onset  . Hypertension Mother   . Diabetes Father   . Heart disease Maternal Grandmother   . Stroke Maternal Grandmother     . Heart disease Maternal Grandfather   . Dementia Paternal Grandmother   . Dementia Paternal Grandfather     Social History Social History   Tobacco Use  . Smoking status: Former Smoker    Packs/day: 0.12    Years: 38.00    Pack years: 4.56    Types: Cigarettes  . Smokeless tobacco: Never Used  . Tobacco comment: .2 cigs  a day   Substance Use Topics  . Alcohol use: No    Alcohol/week: 0.0 standard drinks  . Drug use: No     Allergies   Levaquin [levofloxacin] and Codeine   Review of Systems Review of Systems  All other systems reviewed and are negative.    Physical Exam Updated Vital Signs BP 108/78   Pulse 85   Temp (!) 97.5 F (36.4 C) (Temporal)   Resp 16   Ht 5\' 5"  (1.651 m)   Wt 56.7 kg   SpO2 96%   BMI 20.80 kg/m   Physical Exam Vitals signs and nursing note reviewed.  Constitutional:      Appearance: She is well-developed.     Comments: Pale and dehydrated.  HENT:     Head: Normocephalic and atraumatic.  Eyes:     Conjunctiva/sclera: Conjunctivae normal.  Neck:     Musculoskeletal: Neck supple.  Cardiovascular:     Rate and Rhythm: Normal rate and regular rhythm.  Pulmonary:     Effort: Pulmonary effort is normal.     Breath sounds: Normal breath sounds.  Abdominal:     General: Bowel sounds are normal.     Palpations: Abdomen is soft.  Musculoskeletal: Normal range of motion.  Skin:    General: Skin is warm and dry.  Neurological:     Mental Status: She is alert and oriented to person, place, and time.  Psychiatric:        Behavior: Behavior normal.      ED Treatments / Results  Labs (all labs ordered are listed, but only abnormal results are displayed) Labs Reviewed  CBC WITH DIFFERENTIAL/PLATELET - Abnormal; Notable for the following components:      Result Value   RBC 5.29 (*)    Hemoglobin 15.6 (*)    Platelets 105 (*)    All other components within normal limits  COMPREHENSIVE METABOLIC PANEL - Abnormal; Notable for  the following components:   Potassium 2.7 (*)    Chloride 95 (*)    Glucose, Bld 101 (*)    Total Protein 6.4 (*)    Total Bilirubin 1.5 (*)    Anion gap 17 (*)    All other components within normal limits    EKG None  Radiology No results found.  Procedures Procedures (including critical care time)  Medications Ordered in ED Medications  ondansetron (ZOFRAN) injection 4 mg (4 mg Intravenous Given 05/05/18 1356)  sodium chloride  0.9 % bolus 1,000 mL (0 mLs Intravenous Stopped 05/05/18 1525)  famotidine (PEPCID) IVPB 20 mg premix (0 mg Intravenous Stopped 05/05/18 1421)  sodium chloride 0.9 % bolus 1,000 mL (0 mLs Intravenous Stopped 05/05/18 1806)  potassium chloride 10 mEq in 100 mL IVPB (0 mEq Intravenous Stopped 05/05/18 1806)     Initial Impression / Assessment and Plan / ED Course  I have reviewed the triage vital signs and the nursing notes.  Pertinent labs & imaging results that were available during my care of the patient were reviewed by me and considered in my medical decision making (see chart for details).     History and physical consistent with dehydration secondary to persistent nausea and vomiting.  Patient feels much better after 2 L of IV fluids.  Potassium replaced IV.  Patient sent home with oral potassium.  She feels much better at discharge.  Discussed with patient and her daughter.  Final Clinical Impressions(s) / ED Diagnoses   Final diagnoses:  Intractable vomiting with nausea, unspecified vomiting type    ED Discharge Orders         Ordered    potassium chloride SA (K-DUR,KLOR-CON) 20 MEQ tablet  2 times daily     05/05/18 1815           Nat Christen, MD 05/05/18 2132

## 2018-05-05 NOTE — ED Triage Notes (Signed)
Pt c/o n/v/dizziness x 2 days, pt reports she is unable to eat, pt sees cancer center here but receives radiation at [facility] cancer center, pt was sent by [facility] cancer [facility], pt last rcvd chemo 04/29/2018, last radiation treatment 05/04/2018

## 2018-05-05 NOTE — Progress Notes (Signed)
Patient's husband called stating that patient is weak and has not been eating or drinking hardly anything.  He states that she has lost close to 40 pounds all together.  He said that her decrease in intake has been due to nausea and loss of appetite.  He said that she was taking compazine that we gave her and it didn't help so her physician at radiation oncology gave her ODT Zofran which initially helped but now isn't helping as much.  I have offered to call her in Scopolamine patch to use every 72 hours behind ear for nausea and then continue to use the zofran and compazine as needed.  I told him that I would talk with the physician about her appetite.  I told him that we could also bring her in today for fluids and IV nausea meds and he said they would just wait until tomorrow when she comes for her scheduled treatment.    I advised him to continue to encourage her to drink even if she isn't eating much. Her fluid intake will decrease her chance of becoming dehydrated.  Its okay for her not to eat here and there but continue to encourage fluid intake.

## 2018-05-05 NOTE — ED Notes (Signed)
CRITICAL VALUE ALERT  Critical Value:  K+ 2.7  Date & Time Notied:  05/05/2018  Provider Notified: Dr Lacinda Axon  Orders Received/Actions taken: see new orders.

## 2018-05-06 ENCOUNTER — Encounter (HOSPITAL_COMMUNITY): Payer: Self-pay | Admitting: Hematology

## 2018-05-06 ENCOUNTER — Inpatient Hospital Stay (HOSPITAL_BASED_OUTPATIENT_CLINIC_OR_DEPARTMENT_OTHER): Payer: 59 | Admitting: Hematology

## 2018-05-06 ENCOUNTER — Other Ambulatory Visit: Payer: Self-pay

## 2018-05-06 ENCOUNTER — Inpatient Hospital Stay (HOSPITAL_COMMUNITY): Payer: 59

## 2018-05-06 VITALS — BP 108/63 | HR 99 | Temp 97.7°F | Resp 18 | Wt 126.2 lb

## 2018-05-06 DIAGNOSIS — F419 Anxiety disorder, unspecified: Secondary | ICD-10-CM

## 2018-05-06 DIAGNOSIS — F329 Major depressive disorder, single episode, unspecified: Secondary | ICD-10-CM | POA: Diagnosis not present

## 2018-05-06 DIAGNOSIS — R0789 Other chest pain: Secondary | ICD-10-CM

## 2018-05-06 DIAGNOSIS — R112 Nausea with vomiting, unspecified: Secondary | ICD-10-CM | POA: Diagnosis not present

## 2018-05-06 DIAGNOSIS — C3431 Malignant neoplasm of lower lobe, right bronchus or lung: Secondary | ICD-10-CM | POA: Diagnosis not present

## 2018-05-06 DIAGNOSIS — Z5111 Encounter for antineoplastic chemotherapy: Secondary | ICD-10-CM | POA: Diagnosis not present

## 2018-05-06 DIAGNOSIS — Z87891 Personal history of nicotine dependence: Secondary | ICD-10-CM

## 2018-05-06 LAB — COMPREHENSIVE METABOLIC PANEL
ALT: 38 U/L (ref 0–44)
AST: 35 U/L (ref 15–41)
Albumin: 3.1 g/dL — ABNORMAL LOW (ref 3.5–5.0)
Alkaline Phosphatase: 72 U/L (ref 38–126)
Anion gap: 13 (ref 5–15)
BUN: 12 mg/dL (ref 6–20)
CO2: 25 mmol/L (ref 22–32)
Calcium: 8.8 mg/dL — ABNORMAL LOW (ref 8.9–10.3)
Chloride: 100 mmol/L (ref 98–111)
Creatinine, Ser: 0.49 mg/dL (ref 0.44–1.00)
GFR calc Af Amer: >60 mL/min (ref >60)
GFR calc non Af Amer: >60 mL/min (ref >60)
Glucose, Bld: 79 mg/dL (ref 70–99)
Potassium: 3.1 mmol/L — ABNORMAL LOW (ref 3.5–5.1)
Sodium: 138 mmol/L (ref 135–145)
Total Bilirubin: 1.1 mg/dL (ref 0.3–1.2)
Total Protein: 5.6 g/dL — ABNORMAL LOW (ref 6.5–8.1)

## 2018-05-06 LAB — CBC WITH DIFFERENTIAL/PLATELET
Abs Immature Granulocytes: 0.04 10*3/uL (ref 0.00–0.07)
Basophils Absolute: 0 10*3/uL (ref 0.0–0.1)
Basophils Relative: 0 %
Eosinophils Absolute: 0 10*3/uL (ref 0.0–0.5)
Eosinophils Relative: 0 %
HCT: 42.3 % (ref 36.0–46.0)
Hemoglobin: 14 g/dL (ref 12.0–15.0)
Immature Granulocytes: 1 %
Lymphocytes Relative: 17 %
Lymphs Abs: 1 10*3/uL (ref 0.7–4.0)
MCH: 29.4 pg (ref 26.0–34.0)
MCHC: 33.1 g/dL (ref 30.0–36.0)
MCV: 88.9 fL (ref 80.0–100.0)
Monocytes Absolute: 0.4 10*3/uL (ref 0.1–1.0)
Monocytes Relative: 7 %
Neutro Abs: 4.2 10*3/uL (ref 1.7–7.7)
Neutrophils Relative %: 75 %
Platelets: 109 10*3/uL — ABNORMAL LOW (ref 150–400)
RBC: 4.76 MIL/uL (ref 3.87–5.11)
RDW: 13.5 % (ref 11.5–15.5)
WBC: 5.6 10*3/uL (ref 4.0–10.5)
nRBC: 0 % (ref 0.0–0.2)

## 2018-05-06 MED ORDER — SODIUM CHLORIDE 0.9 % IV SOLN
Freq: Once | INTRAVENOUS | Status: AC
  Administered 2018-05-06: 11:00:00 via INTRAVENOUS
  Filled 2018-05-06: qty 1000

## 2018-05-06 MED ORDER — DEXAMETHASONE 4 MG PO TABS: 4.0000 mg | ORAL_TABLET | Freq: Every day | ORAL | 0 refills | Status: DC

## 2018-05-06 MED ORDER — HEPARIN SOD (PORK) LOCK FLUSH 100 UNIT/ML IV SOLN
500.0000 [IU] | Freq: Once | INTRAVENOUS | Status: AC
  Administered 2018-05-06: 500 [IU] via INTRAVENOUS

## 2018-05-06 MED ORDER — SODIUM CHLORIDE 0.9% FLUSH
10.0000 mL | Freq: Once | INTRAVENOUS | Status: AC
  Administered 2018-05-06: 10 mL

## 2018-05-06 MED ORDER — ONDANSETRON HCL 4 MG/2ML IJ SOLN
8.0000 mg | Freq: Once | INTRAMUSCULAR | Status: AC
  Administered 2018-05-06: 8 mg via INTRAVENOUS
  Filled 2018-05-06: qty 4

## 2018-05-06 NOTE — Progress Notes (Signed)
Patient is not getting treatment today. We will give 1 liter NS, zofran, and potassium. We will start back next week.

## 2018-05-06 NOTE — Patient Instructions (Signed)
[facility] Discharge Instructions     Thank you for choosing [facility] at [facility] to provide your oncology and hematology care.  To afford each patient quality time with our provider, please arrive at least 15 minutes before your scheduled appointment time.   If you have a lab appointment with the Cancer Center please come in thru the  Main Entrance and check in at the main information desk  You need to re-schedule your appointment should you arrive 10 or more minutes late.  We strive to give you quality time with our providers, and arriving late affects you and other patients whose appointments are after yours.  Also, if you no show three or more times for appointments you may be dismissed from the clinic at the providers discretion.     Again, thank you for choosing [facility].  Our hope is that these requests will decrease the amount of time that you wait before being seen by our physicians.       _____________________________________________________________  Should you have questions after your visit to [facility], please contact our office at [phone] between the hours of 8:00 a.m. and 4:30 p.m.  Voicemails left after 4:00 p.m. will not be returned until the following business day.  For prescription refill requests, have your pharmacy contact our office and allow 72 hours.    Cancer Center Support Programs:   > Cancer Support Group  2nd Tuesday of the month 1pm-2pm, Journey Room    

## 2018-05-06 NOTE — Patient Instructions (Signed)
[facility] at [facility]  Discharge Instructions:   _______________________________________________________________  Thank you for choosing [facility] at [facility] to provide your oncology and hematology care.  To afford each patient quality time with our providers, please arrive at least 15 minutes before your scheduled appointment.  You need to re-schedule your appointment if you arrive 10 or more minutes late.  We strive to give you quality time with our providers, and arriving late affects you and other patients whose appointments are after yours.  Also, if you no show three or more times for appointments you may be dismissed from the clinic.  Again, thank you for choosing [facility] at [facility]. Our hope is that these requests will allow you access to exceptional care and in a timely manner. _______________________________________________________________  If you have questions after your visit, please contact our office at [phone] between the hours of 8:30 a.m. and 5:00 p.m. Voicemails left after 4:30 p.m. will not be returned until the following business day. _______________________________________________________________  For prescription refill requests, have your pharmacy contact our office. _______________________________________________________________  Recommendations made by the consultant and any test results will be sent to your referring physician. _______________________________________________________________ 

## 2018-05-06 NOTE — Progress Notes (Signed)
Pt's treatment  is being held today per Dr. Delton Coombes VO. VO received to give IV fluids per physician order. NS, K+ 27meq, and Mag 2 Gram.   Vital signs stable. No complaints at this time. Discharged from clinic ambulatory. F/U with [facility] as scheduled.

## 2018-05-06 NOTE — Progress Notes (Signed)
[address], [place] [address]   CLINIC:  Medical Oncology/Hematology  PCP:  [name], [place] [address] [phone]   REASON FOR VISIT: Follow-up for adenocarcinoma of the lung.  CURRENT THERAPY:Chemoradiation  BRIEF ONCOLOGIC HISTORY:    Cancer of right lung ([place])   02/28/2018 Initial Diagnosis    Cancer of right lung ([place])    03/25/2018 Cancer Staging    Staging form: Lung, AJCC 8th Edition - Clinical stage from 03/25/2018: Stage IIIB (cT4, cN2, cM0) - Signed by Derek [name], MD on 03/25/2018    04/07/2018 -  Chemotherapy    The patient had palonosetron (ALOXI) injection 0.25 mg, 0.25 mg, Intravenous,  Once, 4 of 5 cycles Administration: 0.25 mg (04/07/2018), 0.25 mg (04/15/2018), 0.25 mg (04/22/2018), 0.25 mg (04/29/2018) CARBOplatin (PARAPLATIN) 210 mg in sodium chloride 0.9 % 250 mL chemo infusion, 210 mg (100 % of original dose 209.4 mg), Intravenous,  Once, 4 of 5 cycles Dose modification:   (original dose 209.4 mg, Cycle 1),   (original dose 209.4 mg, Cycle 2), 209.4 mg (original dose 209.4 mg, Cycle 3),   (original dose 209.4 mg, Cycle 4) Administration: 210 mg (04/07/2018), 210 mg (04/15/2018), 210 mg (04/22/2018), 210 mg (04/29/2018) PACLitaxel (TAXOL) 78 mg in sodium chloride 0.9 % 250 mL chemo infusion (</= 80mg /m2), 45 mg/m2 = 78 mg, Intravenous,  Once, 4 of 5 cycles Administration: 78 mg (04/07/2018), 78 mg (04/15/2018), 78 mg (04/22/2018), 78 mg (04/29/2018)  for chemotherapy treatment.       CANCER STAGING: Cancer Staging Cancer of right lung [facility]) Staging form: Lung, AJCC 8th Edition - Clinical stage from 03/25/2018: Stage IIIB (cT4, cN2, cM0) - Signed by Derek [name], MD on 03/25/2018    INTERVAL HISTORY:  Ms. Jezewski 53 y.o. female returns for routine follow-up for adenocarcinoma of the lung. She is here today with her daughter. She finishes radiation on February 3rd.  She is having a lot of nausea and vomiting and she is unable to eat. She has no sense of smell. She has lost weight and she has generalized pain from dry heaving so much. She has diarrhea after treatment and it improves after a few days. Denies any constipation. Denies any new pains. Had not noticed any recent bleeding such as epistaxis, hematuria or hematochezia. Denies recent chest pain on exertion, shortness of breath on minimal exertion, pre-syncopal episodes, or palpitations. Denies any numbness or tingling in hands or feet. Denies any recent fevers, infections. Patient reports appetite at 0% and energy level at 0%. She doesn't feel she can handle treatment this week and wishes to start back next week.    REVIEW OF SYSTEMS:  Review of Systems  Constitutional: Positive for fatigue.  Gastrointestinal: Positive for nausea and vomiting.  Neurological: Positive for dizziness and extremity weakness.  All other systems reviewed and are negative.    PAST MEDICAL/SURGICAL HISTORY:  Past Medical History:  Diagnosis Date  . Anxiety   . Depression   . Lung cancer ([place])   . Migraine   . Pneumonia   . 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 ABDOMINAL HYSTERECTOMY  2000  . PORTACATH PLACEMENT Left 03/14/2018   Procedure: INSERTION PORT-A-CATH;  Surgeon: Aviva Signs, MD;  Location: [facility];  Service: General;  Laterality: Left;  Marland Kitchen VIDEO BRONCHOSCOPY WITH ENDOBRONCHIAL ULTRASOUND N/A 03/21/2018   Procedure: VIDEO BRONCHOSCOPY WITH ENDOBRONCHIAL  ULTRASOUND;  Surgeon: Melrose Nakayama, MD;  Location: [facility] OR;  Service: Thoracic;  Laterality: N/A;     SOCIAL HISTORY:  Social History   Socioeconomic History  . Marital status: Married    Spouse name: [name]  . Number of children: 1  . Years of education: College  . Highest education level: Not on file  Occupational History  . Occupation: Advice worker: [employer]    Social Needs  . Financial resource strain: Not hard at all  . Food insecurity:    Worry: Never true    Inability: Never true  . Transportation needs:    Medical: No    Non-medical: No  Tobacco Use  . Smoking status: Former Smoker    Packs/day: 0.12    Years: 38.00    Pack years: 4.56    Types: Cigarettes  . Smokeless tobacco: Never Used  . Tobacco comment: .2 cigs  a day   Substance and Sexual Activity  . Alcohol use: No    Alcohol/week: 0.0 standard drinks  . Drug use: No  . Sexual activity: Yes    Birth control/protection: Surgical  Lifestyle  . Physical activity:    Days per week: 0 days    Minutes per session: 0 min  . Stress: Rather much  Relationships  . Social connections:    Talks on phone: More than three times a week    Gets together: Once a week    Attends religious service: Never    Active member of club or organization: No    Attends meetings of clubs or organizations: Never    Relationship status: Married  . Intimate partner violence:    Fear of current or ex partner: No    Emotionally abused: No    Physically abused: No    Forced sexual activity: No  Other Topics Concern  . Not on file  Social History Narrative   Patient is married with one child.   Patient is right handed.   Patient has college education.   Caffeine use: Patient drinks 1 glass of tea daily.    FAMILY HISTORY:  Family History  Problem Relation Age of Onset  . Hypertension Mother   . Diabetes Father   . Heart disease Maternal Grandmother   . Stroke Maternal Grandmother   . Heart disease Maternal Grandfather   . Dementia Paternal Grandmother   . Dementia Paternal Grandfather     CURRENT MEDICATIONS:  Outpatient Encounter Medications as of 05/06/2018  Medication Sig  . ALPRAZolam (XANAX) 1 MG tablet Take 1 tablet (1 mg total) by mouth 2 (two) times daily. TAKE 1/2 TO 1 TABLET TWICE A DAY FOR ANXIETY  . CARBOPLATIN IV Inject into the vein once a week.  Marland Kitchen  HYDROcodone-acetaminophen (NORCO/VICODIN) 5-325 MG tablet Take 1 tablet by mouth every 4 (four) hours as needed for moderate pain.  Marland Kitchen lidocaine-prilocaine (EMLA) cream Apply small amount to port site one hour prior to appointment. Cover with plastic wrap.  . omeprazole (PRILOSEC OTC) 20 MG tablet Take 20 mg by mouth daily.   . ondansetron (ZOFRAN ODT) 8 MG disintegrating tablet Take 1 tablet (8 mg total) by mouth every 8 (eight) hours as needed for nausea or vomiting (may cause constipation).  Marland Kitchen PACLITAXEL IV Inject 78 mg into the vein once a week.  . potassium chloride SA (K-DUR,KLOR-CON) 20 MEQ tablet Take 1 tablet (20 mEq total) by mouth 2 (two) times daily.  . prochlorperazine (  COMPAZINE) 10 MG tablet Take 1 tablet (10 mg total) by mouth every 6 (six) hours as needed (Nausea or vomiting).  Marland Kitchen scopolamine (TRANSDERM-SCOP) 1 MG/3DAYS Place 1 patch (1.5 mg total) onto the skin every 3 (three) days.  Marland Kitchen sertraline (ZOLOFT) 100 MG tablet TAKE 1 AND 1/2 TABLETS ONCE DAILY  . traZODone (DESYREL) 100 MG tablet Take 3 tablets (300 mg total) by mouth at bedtime.  Marland Kitchen dexamethasone (DECADRON) 4 MG tablet Take 1 tablet (4 mg total) by mouth daily.   No facility-administered encounter medications on file as of 05/06/2018.     ALLERGIES:  Allergies  Allergen Reactions  . Levaquin [Levofloxacin] Other (See Comments)    Myalgia  . Codeine Nausea And Vomiting     PHYSICAL EXAM:  ECOG Performance status: 1  Vitals:   05/06/18 0900  BP: 108/63  Pulse: 99  Resp: 18  Temp: 97.7 F (36.5 C)  SpO2: 100%   Filed Weights   05/06/18 0900  Weight: 126 lb 3 oz (57.2 kg)    Physical Exam Constitutional:      Appearance: Normal appearance. She is normal weight.  Cardiovascular:     Rate and Rhythm: Normal rate and regular rhythm.     Heart sounds: Normal heart sounds.  Pulmonary:     Effort: Pulmonary effort is normal.     Breath sounds: Normal breath sounds.  Musculoskeletal: Normal range of  motion.  Skin:    General: Skin is warm and dry.  Neurological:     Mental Status: She is alert and oriented to person, place, and time. Mental status is at baseline.  Psychiatric:        Mood and Affect: Mood normal.        Behavior: Behavior normal.        Thought Content: Thought content normal.        Judgment: Judgment normal.      LABORATORY DATA:  I have reviewed the labs as listed.  CBC    Component Value Date/Time   WBC 5.6 05/06/2018 0836   RBC 4.76 05/06/2018 0836   HGB 14.0 05/06/2018 0836   HGB 18.1 (H) 09/25/2017 0829   HCT 42.3 05/06/2018 0836   HCT 52.8 (H) 09/25/2017 0829   PLT 109 (L) 05/06/2018 0836   PLT 239 09/25/2017 0829   MCV 88.9 05/06/2018 0836   MCV 91 09/25/2017 0829   MCH 29.4 05/06/2018 0836   MCHC 33.1 05/06/2018 0836   RDW 13.5 05/06/2018 0836   RDW 13.8 09/25/2017 0829   LYMPHSABS 1.0 05/06/2018 0836   LYMPHSABS 3.4 (H) 09/25/2017 0829   MONOABS 0.4 05/06/2018 0836   EOSABS 0.0 05/06/2018 0836   EOSABS 0.2 09/25/2017 0829   BASOSABS 0.0 05/06/2018 0836   BASOSABS 0.0 09/25/2017 0829   CMP Latest Ref Rng & Units 05/06/2018 05/05/2018 04/29/2018  Glucose 70 - 99 mg/dL 79 101(H) 97  BUN 6 - 20 mg/dL 12 16 8   Creatinine 0.44 - 1.00 mg/dL 0.49 0.59 0.45  Sodium 135 - 145 mmol/L 138 136 138  Potassium 3.5 - 5.1 mmol/L 3.1(L) 2.7(LL) 3.1(L)  Chloride 98 - 111 mmol/L 100 95(L) 102  CO2 22 - 32 mmol/L 25 24 27   Calcium 8.9 - 10.3 mg/dL 8.8(L) 9.1 9.0  Total Protein 6.5 - 8.1 g/dL 5.6(L) 6.4(L) 6.1(L)  Total Bilirubin 0.3 - 1.2 mg/dL 1.1 1.5(H) 1.0  Alkaline Phos 38 - 126 U/L 72 84 79  AST 15 - 41 U/L 35 40 24  ALT 0 - 44 U/L 38 44 30       DIAGNOSTIC IMAGING:  I have independently reviewed the scans and discussed with the patient.   I have reviewed Francene Finders, NP's note and agree with the documentation.  I personally performed a face-to-face visit, made revisions and my assessment and plan is as follows.    ASSESSMENT & PLAN:    Cancer of right lung ([place]) 1.  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 -She went to the ER on 02/23/2018 when a CT of the chest was done which showed necrotic adenopathy within the mediastinum and right hilum.  A central superior segment right lower lobe nodule likely represents the primary. - She has a 57-pack-year smoking history. -She lost about 12 pounds in the last couple of months.  She does have occasional dysphagia to solid foods. - She does have hemoptysis with bloodstained sputum for the past couple of months. - I reviewed the results of the PET CT scan dated 03/11/2018 which shows posterior right mediastinal confluent necrotic adenopathy.  Right lower lobe mass measuring 4.4 x 4.1 cm, intimately associated with the anterior aspect of the T4 vertebral body, without gross osseous destruction.  There is a 9 mm subcarinal lymph node.  No other metastatic disease was seen. -We discussed the results of the MRI of the brain dated 02/26/2018 which was negative for metastatic disease. -She underwent bronch with EBUS biopsy on 03/21/2018. -I reviewed the pathology report which was consistent with adenocarcinoma. -I discussed the normal prognosis and treatment plan of stage IIIb non-small cell lung cancer.  She will need combination chemoradiation therapy.  We discussed the side effects of the chemotherapy regimen. - Weekly carboplatin and paclitaxel along with radiation therapy started on 04/07/2018. - Week 4 of chemotherapy on 04/29/2018. -She was admitted to the hospital from 1/4 through 04/25/2018 with postobstructive pneumonia, fever and hypotension. - She had persistent nausea and vomiting since last 1 week.  She is not able to eat much.  She lost 10 pounds since the beginning of the month.  She is taking Zofran and Compazine.  This is not completely helping. -We have called in scopolamine patch.  I have  also called in dexamethasone 4 mg to be taken once daily, to be increased to twice daily if it helps. -She will receive 1 L of fluid with electrolytes today.  She will also receive Zofran IV.  We will hold her week 5 of chemotherapy today. -She will finish radiation therapy on 06/14/2018.  We will plan to give 2 more cycles of weekly carboplatin and paclitaxel. -We will reevaluate her in 1 week.  2.  Anterior chest pain: - She will continue hydrocodone 5 mg every 6 hours as needed for her midsternal pain.        Orders placed this encounter:  Orders Placed This Encounter  Procedures  . Magnesium  . CBC with Differential/Platelet  . Comprehensive metabolic panel      Derek [name], MD [place] [phone]

## 2018-05-06 NOTE — Assessment & Plan Note (Signed)
1.  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 -She went to the ER on 02/23/2018 when a CT of the chest was done which showed necrotic adenopathy within the mediastinum and right hilum.  A central superior segment right lower lobe nodule likely represents the primary. - She has a 57-pack-year smoking history. -She lost about 12 pounds in the last couple of months.  She does have occasional dysphagia to solid foods. - She does have hemoptysis with bloodstained sputum for the past couple of months. - I reviewed the results of the PET CT scan dated 03/11/2018 which shows posterior right mediastinal confluent necrotic adenopathy.  Right lower lobe mass measuring 4.4 x 4.1 cm, intimately associated with the anterior aspect of the T4 vertebral body, without gross osseous destruction.  There is a 9 mm subcarinal lymph node.  No other metastatic disease was seen. -We discussed the results of the MRI of the brain dated 02/26/2018 which was negative for metastatic disease. -She underwent bronch with EBUS biopsy on 03/21/2018. -I reviewed the pathology report which was consistent with adenocarcinoma. -I discussed the normal prognosis and treatment plan of stage IIIb non-small cell lung cancer.  She will need combination chemoradiation therapy.  We discussed the side effects of the chemotherapy regimen. - Weekly carboplatin and paclitaxel along with radiation therapy started on 04/07/2018. - Week 4 of chemotherapy on 04/29/2018. -She was admitted to the hospital from 1/4 through 04/25/2018 with postobstructive pneumonia, fever and hypotension. - She had persistent nausea and vomiting since last 1 week.  She is not able to eat much.  She lost 10 pounds since the beginning of the month.  She is taking Zofran and Compazine.  This is not completely helping. -We have called in scopolamine patch.  I have also called in dexamethasone 4 mg  to be taken once daily, to be increased to twice daily if it helps. -She will receive 1 L of fluid with electrolytes today.  She will also receive Zofran IV.  We will hold her week 5 of chemotherapy today. -She will finish radiation therapy on 06/11/2018.  We will plan to give 2 more cycles of weekly carboplatin and paclitaxel. -We will reevaluate her in 1 week.  2.  Anterior chest pain: - She will continue hydrocodone 5 mg every 6 hours as needed for her midsternal pain.

## 2018-05-11 ENCOUNTER — Encounter (HOSPITAL_COMMUNITY): Payer: Self-pay

## 2018-05-11 ENCOUNTER — Inpatient Hospital Stay (HOSPITAL_COMMUNITY): Payer: 59

## 2018-05-11 VITALS — BP 95/55 | HR 55 | Temp 97.6°F | Resp 18 | Wt 121.0 lb

## 2018-05-11 DIAGNOSIS — E876 Hypokalemia: Secondary | ICD-10-CM

## 2018-05-11 DIAGNOSIS — Z5111 Encounter for antineoplastic chemotherapy: Secondary | ICD-10-CM | POA: Diagnosis not present

## 2018-05-11 DIAGNOSIS — C3431 Malignant neoplasm of lower lobe, right bronchus or lung: Secondary | ICD-10-CM

## 2018-05-11 LAB — COMPREHENSIVE METABOLIC PANEL
ALT: 57 U/L — ABNORMAL HIGH (ref 0–44)
AST: 54 U/L — ABNORMAL HIGH (ref 15–41)
Albumin: 3.6 g/dL (ref 3.5–5.0)
Alkaline Phosphatase: 70 U/L (ref 38–126)
Anion gap: 10 (ref 5–15)
BUN: 16 mg/dL (ref 6–20)
CO2: 30 mmol/L (ref 22–32)
Calcium: 9.1 mg/dL (ref 8.9–10.3)
Chloride: 99 mmol/L (ref 98–111)
Creatinine, Ser: 0.53 mg/dL (ref 0.44–1.00)
GFR calc non Af Amer: >60 mL/min (ref >60)
Glucose, Bld: 110 mg/dL — ABNORMAL HIGH (ref 70–99)
Potassium: 2.5 mmol/L — CL (ref 3.5–5.1)
SODIUM: 139 mmol/L (ref 135–145)
Total Bilirubin: 0.8 mg/dL (ref 0.3–1.2)
Total Protein: 6.1 g/dL — ABNORMAL LOW (ref 6.5–8.1)

## 2018-05-11 LAB — CBC WITH DIFFERENTIAL/PLATELET
Abs Immature Granulocytes: 0.03 10*3/uL (ref 0.00–0.07)
Basophils Absolute: 0 10*3/uL (ref 0.0–0.1)
Basophils Relative: 0 %
Eosinophils Absolute: 0.2 10*3/uL (ref 0.0–0.5)
Eosinophils Relative: 3 %
HCT: 41.1 % (ref 36.0–46.0)
Hemoglobin: 13.8 g/dL (ref 12.0–15.0)
Immature Granulocytes: 0 %
LYMPHS ABS: 0.6 10*3/uL — AB (ref 0.7–4.0)
Lymphocytes Relative: 8 %
MCH: 29.8 pg (ref 26.0–34.0)
MCHC: 33.6 g/dL (ref 30.0–36.0)
MCV: 88.8 fL (ref 80.0–100.0)
Monocytes Absolute: 0.7 10*3/uL (ref 0.1–1.0)
Monocytes Relative: 9 %
NRBC: 0 % (ref 0.0–0.2)
Neutro Abs: 5.9 10*3/uL (ref 1.7–7.7)
Neutrophils Relative %: 80 %
Platelets: 155 10*3/uL (ref 150–400)
RBC: 4.63 MIL/uL (ref 3.87–5.11)
RDW: 14.6 % (ref 11.5–15.5)
WBC: 7.4 10*3/uL (ref 4.0–10.5)

## 2018-05-11 LAB — MAGNESIUM: Magnesium: 1.7 mg/dL (ref 1.7–2.4)

## 2018-05-11 MED ORDER — SODIUM CHLORIDE 0.9% FLUSH
10.0000 mL | INTRAVENOUS | Status: DC | PRN
  Administered 2018-05-11: 10 mL
  Filled 2018-05-11: qty 10

## 2018-05-11 MED ORDER — POTASSIUM CHLORIDE CRYS ER 20 MEQ PO TBCR
40.0000 meq | EXTENDED_RELEASE_TABLET | Freq: Two times a day (BID) | ORAL | Status: DC
  Administered 2018-05-11: 40 meq via ORAL

## 2018-05-11 MED ORDER — FAMOTIDINE IN NACL 20-0.9 MG/50ML-% IV SOLN
20.0000 mg | Freq: Once | INTRAVENOUS | Status: AC
  Administered 2018-05-11: 20 mg via INTRAVENOUS
  Filled 2018-05-11: qty 50

## 2018-05-11 MED ORDER — SODIUM CHLORIDE 0.9 % IV SOLN
INTRAVENOUS | Status: DC
  Administered 2018-05-11: 11:00:00 via INTRAVENOUS

## 2018-05-11 MED ORDER — IPRATROPIUM-ALBUTEROL 0.5-2.5 (3) MG/3ML IN SOLN
3.0000 mL | Freq: Four times a day (QID) | RESPIRATORY_TRACT | Status: DC
  Administered 2018-05-11: 3 mL via RESPIRATORY_TRACT

## 2018-05-11 MED ORDER — HEPARIN SOD (PORK) LOCK FLUSH 100 UNIT/ML IV SOLN
500.0000 [IU] | Freq: Once | INTRAVENOUS | Status: DC | PRN
  Filled 2018-05-11: qty 5

## 2018-05-11 MED ORDER — SODIUM CHLORIDE 0.9 % IV SOLN
45.0000 mg/m2 | Freq: Once | INTRAVENOUS | Status: AC
  Administered 2018-05-11: 78 mg via INTRAVENOUS
  Filled 2018-05-11: qty 13

## 2018-05-11 MED ORDER — POTASSIUM CHLORIDE CRYS ER 20 MEQ PO TBCR
EXTENDED_RELEASE_TABLET | ORAL | Status: AC
  Filled 2018-05-11: qty 2

## 2018-05-11 MED ORDER — SODIUM CHLORIDE 0.9 % IV SOLN
209.4000 mg | Freq: Once | INTRAVENOUS | Status: AC
  Administered 2018-05-11: 210 mg via INTRAVENOUS
  Filled 2018-05-11: qty 21

## 2018-05-11 MED ORDER — PALONOSETRON HCL INJECTION 0.25 MG/5ML
0.2500 mg | Freq: Once | INTRAVENOUS | Status: AC
  Administered 2018-05-11: 0.25 mg via INTRAVENOUS
  Filled 2018-05-11: qty 5

## 2018-05-11 MED ORDER — IPRATROPIUM-ALBUTEROL 0.5-2.5 (3) MG/3ML IN SOLN
RESPIRATORY_TRACT | Status: AC
  Filled 2018-05-11: qty 3

## 2018-05-11 MED ORDER — POTASSIUM CHLORIDE CRYS ER 20 MEQ PO TBCR
40.0000 meq | EXTENDED_RELEASE_TABLET | ORAL | Status: AC
  Administered 2018-05-11: 40 meq via ORAL
  Filled 2018-05-11: qty 2

## 2018-05-11 MED ORDER — SODIUM CHLORIDE 0.9 % IV SOLN: Freq: Once | INTRAVENOUS | Status: DC

## 2018-05-11 MED ORDER — DIPHENHYDRAMINE HCL 50 MG/ML IJ SOLN
50.0000 mg | Freq: Once | INTRAMUSCULAR | Status: AC
  Administered 2018-05-11: 50 mg via INTRAVENOUS
  Filled 2018-05-11: qty 1

## 2018-05-11 MED ORDER — SODIUM CHLORIDE 0.9 % IV SOLN
Freq: Once | INTRAVENOUS | Status: AC
  Administered 2018-05-11: 13:00:00 via INTRAVENOUS
  Filled 2018-05-11: qty 5

## 2018-05-11 MED ORDER — POTASSIUM CHLORIDE 10 MEQ/100ML IV SOLN
10.0000 meq | INTRAVENOUS | Status: AC
  Administered 2018-05-11 (×4): 10 meq via INTRAVENOUS
  Filled 2018-05-11 (×4): qty 100

## 2018-05-11 NOTE — Patient Instructions (Signed)
[facility] Discharge Instructions for Patients Receiving Chemotherapy  Today you received the following chemotherapy agents  If you develop nausea and vomiting that is not controlled by your nausea medication, call the clinic.   BELOW ARE SYMPTOMS THAT SHOULD BE REPORTED IMMEDIATELY:  *FEVER GREATER THAN 100.5 F  *CHILLS WITH OR WITHOUT FEVER  NAUSEA AND VOMITING THAT IS NOT CONTROLLED WITH YOUR NAUSEA MEDICATION  *UNUSUAL SHORTNESS OF BREATH  *UNUSUAL BRUISING OR BLEEDING  TENDERNESS IN MOUTH AND THROAT WITH OR WITHOUT PRESENCE OF ULCERS  *URINARY PROBLEMS  *BOWEL PROBLEMS  UNUSUAL RASH Items with * indicate a potential emergency and should be followed up as soon as possible.  Feel free to call the clinic should you have any questions or concerns. The clinic phone number is [phone].  Please show the CHEMO ALERT CARD at check-in to the Emergency Department and triage nurse.   

## 2018-05-11 NOTE — Progress Notes (Signed)
Reviewed K 2.5 and vital signs with NP and oncologist with verbal orders received and ok to treat today.   Reviewed blood pressures with Dr. Delton Coombes after pharmacy review of vital signs with verbal order NACL 557ml bolus per Dr. Delton Coombes.  Pharmacy notified.    1420- patient complains of Chest pain, difficulty breathing, and cough.  Taxol and potassium run stopped and Dr. Delton Coombes made aware.  Oxygen started at 3L/min and vital signs monitored.  See flowsheet for all vital signs monitored during stay.   1420-Dr. Delton Coombes and Francene Finders, NP, in room with patient and staff.  Duoneb breathing treatment given per verbal order Dr. Delton Coombes.   1422-patient stated chest pains were better and could breathe better.  Family at side.  1430-patient denied chest pains. Patient stated breathing was better. Oxygen discontinued.  Warm and dry with no s/s of distress noted.  1440-family at side.  Patient resting and denied chest pain or any breathing difficulty.  No s/s of distress noted.  1453-reviewed vital signs and patient denying chest pain with oncologist and nurse practitioner.  D/c taxol and start Carboplatin verbal order Dr. Delton Coombes.  Ok to give last potassium run per Dr. Delton Coombes.  Patient to return tomorrow for hydration with Potassium and magnesium verbal order Delton Coombes.  Patient and family aware with understanding verbalized.    1523-patient resting with family at side and denied chest pain or breathing difficulty.  Warm and dry.  Ambulated to bathroom with no complaints voiced.   1532-patient denied chest pain and breathing difficulty.  Patient warm and dry with no s/s of distress noted.  Family at side.   Nurse practitioner aware and ok to discharge.  All questions answered for the family and patient.   1545-patient discharged with VSS. Left by wheelchair with family and no s/s of distress noted.  Reminded the patient to go to the ER if symptoms returned with understanding  verbalized.

## 2018-05-12 ENCOUNTER — Ambulatory Visit (HOSPITAL_COMMUNITY): Payer: 59

## 2018-05-12 ENCOUNTER — Inpatient Hospital Stay (HOSPITAL_COMMUNITY): Payer: 59

## 2018-05-12 DIAGNOSIS — Z5111 Encounter for antineoplastic chemotherapy: Secondary | ICD-10-CM | POA: Diagnosis not present

## 2018-05-12 MED ORDER — SODIUM CHLORIDE 0.9% FLUSH
10.0000 mL | Freq: Once | INTRAVENOUS | Status: AC
  Administered 2018-05-12: 10 mL via INTRAVENOUS

## 2018-05-12 MED ORDER — SODIUM CHLORIDE 0.9 % IV SOLN
Freq: Once | INTRAVENOUS | Status: AC
  Administered 2018-05-12: 09:00:00 via INTRAVENOUS
  Filled 2018-05-12: qty 1000

## 2018-05-12 NOTE — Patient Instructions (Signed)
[facility] at [facility]  Discharge Instructions:   _______________________________________________________________  Thank you for choosing [facility] at [facility] to provide your oncology and hematology care.  To afford each patient quality time with our providers, please arrive at least 15 minutes before your scheduled appointment.  You need to re-schedule your appointment if you arrive 10 or more minutes late.  We strive to give you quality time with our providers, and arriving late affects you and other patients whose appointments are after yours.  Also, if you no show three or more times for appointments you may be dismissed from the clinic.  Again, thank you for choosing [facility]. Our hope is that these requests will allow you access to exceptional care and in a timely manner. _______________________________________________________________  If you have questions after your visit, please contact our office at [phone] between the hours of 8:30 a.m. and 5:00 p.m. Voicemails left after 4:30 p.m. will not be returned until the following business day. _______________________________________________________________  For prescription refill requests, have your pharmacy contact our office. _______________________________________________________________  Recommendations made by the consultant and any test results will be sent to your referring physician. _______________________________________________________________ 

## 2018-05-12 NOTE — Progress Notes (Signed)
Patient here today for ivf hydration, patient stated she is feeling better today, ate half of a cheeseburger last night, some fries, has not vomited this morning, has had a bowel movement. Will give fluids per orders.    Vitals stable and discharged home from clinic ambulatory. Follow up as scheduled.

## 2018-05-13 ENCOUNTER — Encounter (HOSPITAL_COMMUNITY): Payer: Self-pay

## 2018-05-13 ENCOUNTER — Other Ambulatory Visit (HOSPITAL_COMMUNITY): Payer: 59

## 2018-05-13 ENCOUNTER — Inpatient Hospital Stay (HOSPITAL_COMMUNITY): Payer: 59

## 2018-05-13 ENCOUNTER — Other Ambulatory Visit: Payer: Self-pay

## 2018-05-13 ENCOUNTER — Encounter (HOSPITAL_COMMUNITY): Payer: 59

## 2018-05-13 ENCOUNTER — Ambulatory Visit (HOSPITAL_COMMUNITY): Payer: 59

## 2018-05-13 VITALS — BP 80/45 | HR 53 | Temp 99.1°F | Resp 20

## 2018-05-13 DIAGNOSIS — R319 Hematuria, unspecified: Secondary | ICD-10-CM

## 2018-05-13 DIAGNOSIS — C3431 Malignant neoplasm of lower lobe, right bronchus or lung: Secondary | ICD-10-CM

## 2018-05-13 DIAGNOSIS — Z5111 Encounter for antineoplastic chemotherapy: Secondary | ICD-10-CM | POA: Diagnosis not present

## 2018-05-13 DIAGNOSIS — R509 Fever, unspecified: Secondary | ICD-10-CM

## 2018-05-13 DIAGNOSIS — N390 Urinary tract infection, site not specified: Secondary | ICD-10-CM

## 2018-05-13 LAB — URINALYSIS, ROUTINE W REFLEX MICROSCOPIC
Bilirubin Urine: NEGATIVE
Glucose, UA: NEGATIVE mg/dL
Ketones, ur: 5 mg/dL — AB
Leukocytes, UA: NEGATIVE
Nitrite: POSITIVE — AB
Protein, ur: NEGATIVE mg/dL
Specific Gravity, Urine: 1.013 (ref 1.005–1.030)
pH: 5 (ref 5.0–8.0)

## 2018-05-13 LAB — CBC WITH DIFFERENTIAL/PLATELET
Abs Immature Granulocytes: 0.03 10*3/uL (ref 0.00–0.07)
Basophils Absolute: 0 10*3/uL (ref 0.0–0.1)
Basophils Relative: 0 %
Eosinophils Absolute: 0 10*3/uL (ref 0.0–0.5)
Eosinophils Relative: 0 %
HEMATOCRIT: 40.4 % (ref 36.0–46.0)
HEMOGLOBIN: 13.2 g/dL (ref 12.0–15.0)
Immature Granulocytes: 1 %
LYMPHS PCT: 3 %
Lymphs Abs: 0.1 10*3/uL — ABNORMAL LOW (ref 0.7–4.0)
MCH: 29.2 pg (ref 26.0–34.0)
MCHC: 32.7 g/dL (ref 30.0–36.0)
MCV: 89.4 fL (ref 80.0–100.0)
Monocytes Absolute: 0.2 10*3/uL (ref 0.1–1.0)
Monocytes Relative: 6 %
Neutro Abs: 3.6 10*3/uL (ref 1.7–7.7)
Neutrophils Relative %: 90 %
PLATELETS: 98 10*3/uL — AB (ref 150–400)
RBC: 4.52 MIL/uL (ref 3.87–5.11)
RDW: 14.7 % (ref 11.5–15.5)
WBC: 4 10*3/uL (ref 4.0–10.5)
nRBC: 0 % (ref 0.0–0.2)

## 2018-05-13 LAB — COMPREHENSIVE METABOLIC PANEL
ALBUMIN: 2.9 g/dL — AB (ref 3.5–5.0)
ALT: 63 U/L — ABNORMAL HIGH (ref 0–44)
AST: 35 U/L (ref 15–41)
Alkaline Phosphatase: 71 U/L (ref 38–126)
Anion gap: 8 (ref 5–15)
BUN: 16 mg/dL (ref 6–20)
CO2: 24 mmol/L (ref 22–32)
Calcium: 8.3 mg/dL — ABNORMAL LOW (ref 8.9–10.3)
Chloride: 101 mmol/L (ref 98–111)
Creatinine, Ser: 0.47 mg/dL (ref 0.44–1.00)
GFR calc Af Amer: >60 mL/min (ref >60)
GFR calc non Af Amer: >60 mL/min (ref >60)
GLUCOSE: 103 mg/dL — AB (ref 70–99)
Potassium: 3.3 mmol/L — ABNORMAL LOW (ref 3.5–5.1)
SODIUM: 133 mmol/L — AB (ref 135–145)
Total Bilirubin: 1.1 mg/dL (ref 0.3–1.2)
Total Protein: 5.6 g/dL — ABNORMAL LOW (ref 6.5–8.1)

## 2018-05-13 MED ORDER — SODIUM CHLORIDE 0.9 % IV SOLN: 8.0000 mg | Freq: Once | INTRAVENOUS | Status: DC

## 2018-05-13 MED ORDER — SODIUM CHLORIDE 0.9 % IV SOLN
Freq: Once | INTRAVENOUS | Status: AC
  Administered 2018-05-13: 8 mg via INTRAVENOUS
  Filled 2018-05-13: qty 4

## 2018-05-13 MED ORDER — CIPROFLOXACIN HCL 500 MG PO TABS: 500.0000 mg | ORAL_TABLET | Freq: Two times a day (BID) | ORAL | 0 refills | Status: DC

## 2018-05-13 MED ORDER — SODIUM CHLORIDE 0.9% FLUSH
10.0000 mL | Freq: Once | INTRAVENOUS | Status: AC
  Administered 2018-05-13: 10 mL via INTRAVENOUS

## 2018-05-13 MED ORDER — HEPARIN SOD (PORK) LOCK FLUSH 100 UNIT/ML IV SOLN
500.0000 [IU] | Freq: Once | INTRAVENOUS | Status: AC
  Administered 2018-05-13: 500 [IU] via INTRAVENOUS

## 2018-05-13 MED ORDER — SODIUM CHLORIDE 0.9 % IV SOLN
INTRAVENOUS | Status: DC
  Administered 2018-05-13: 10:00:00 via INTRAVENOUS

## 2018-05-13 MED ORDER — HEPARIN SOD (PORK) LOCK FLUSH 100 UNIT/ML IV SOLN
INTRAVENOUS | Status: AC
  Filled 2018-05-13: qty 5

## 2018-05-13 MED ORDER — SODIUM CHLORIDE 0.9 % IV SOLN
Freq: Once | INTRAVENOUS | Status: AC
  Administered 2018-05-13: 11:00:00 via INTRAVENOUS
  Filled 2018-05-13: qty 1000

## 2018-05-13 NOTE — Patient Instructions (Signed)
[facility] at [facility]  Discharge Instructions:   _______________________________________________________________  Thank you for choosing [facility] at [facility] to provide your oncology and hematology care.  To afford each patient quality time with our providers, please arrive at least 15 minutes before your scheduled appointment.  You need to re-schedule your appointment if you arrive 10 or more minutes late.  We strive to give you quality time with our providers, and arriving late affects you and other patients whose appointments are after yours.  Also, if you no show three or more times for appointments you may be dismissed from the clinic.  Again, thank you for choosing [facility] at [facility]. Our hope is that these requests will allow you access to exceptional care and in a timely manner. _______________________________________________________________  If you have questions after your visit, please contact our office at [phone] between the hours of 8:30 a.m. and 5:00 p.m. Voicemails left after 4:30 p.m. will not be returned until the following business day. _______________________________________________________________  For prescription refill requests, have your pharmacy contact our office. _______________________________________________________________  Recommendations made by the consultant and any test results will be sent to your referring physician. _______________________________________________________________ 

## 2018-05-13 NOTE — Progress Notes (Signed)
Nutrition Follow-up:  Patient with right lung cancer.  Currently receiving chemotherapy and radiation therapy.  Noted ED visit with nausea, vomiting.    Met with patient and daughter during infusion today.  Patient reports appetite has not been good due to nausea and vomiting.  Also reports smell of foods get to her.  Has been drinking boost breeze shakes, does not like "milky" shakes.  Reports yesterday was able to eat 1/2 cheeseburger and some fries.  Reports that she is planning to come in next week daily for IV fluids.  "I have been trying but I just can't eat much.   Medications: reviewed  Labs: K 3.3, Na 133  Anthropometrics:   Weight is 121 lb today decreased from 129 lb on 1/10 last RD follow-up.    NUTRITION DIAGNOSIS:  Inadequate oral intake continues   INTERVENTION:  Offered encouragement to patient.  Every bite counts. Encouraged small frequent meals/snacks Encouraged patient to continue boost breeze shakes Reviewed high calorie strategies.      MONITORING, EVALUATION, GOAL:  Patient will consume adequate calories to maintain weight during treatment  NEXT VISIT: Jan 31 during infusion  Rheagan Nayak B. Zenia Resides, [place], [place] Registered Dietitian [phone] (pager)

## 2018-05-13 NOTE — Progress Notes (Signed)
Pt presents today for IV fluids, Zofran/ Decadron. VSS. MAR reviewed. UA ordered today per Dr. Delton Coombes. VO received by HBray RN to coollect a urine sample and may give Zofran8mg / Decadron 8mg  IV.   + for Nitrites on UA results today. Reviewed with RLockamy NP. Script to be called in to [facility] on [address] per NP.   IV fluids administered today per MD orders. Tolerated infusion without adverse affects. Vital signs stable. No complaints at this time. Discharged from clinic via wheel chair. F/U with [facility] as scheduled.

## 2018-05-16 ENCOUNTER — Encounter (HOSPITAL_COMMUNITY): Payer: Self-pay | Admitting: *Deleted

## 2018-05-16 ENCOUNTER — Encounter (HOSPITAL_COMMUNITY): Payer: Self-pay

## 2018-05-16 ENCOUNTER — Other Ambulatory Visit: Payer: Self-pay

## 2018-05-16 ENCOUNTER — Inpatient Hospital Stay (HOSPITAL_COMMUNITY): Payer: 59

## 2018-05-16 DIAGNOSIS — Z5111 Encounter for antineoplastic chemotherapy: Secondary | ICD-10-CM | POA: Diagnosis not present

## 2018-05-16 MED ORDER — SODIUM CHLORIDE 0.9 % IV SOLN
Freq: Once | INTRAVENOUS | Status: AC
  Administered 2018-05-16: 10:00:00 via INTRAVENOUS
  Filled 2018-05-16: qty 1000

## 2018-05-16 MED ORDER — HEPARIN SOD (PORK) LOCK FLUSH 100 UNIT/ML IV SOLN
500.0000 [IU] | Freq: Once | INTRAVENOUS | Status: AC
  Administered 2018-05-16: 500 [IU] via INTRAVENOUS

## 2018-05-16 MED ORDER — SODIUM CHLORIDE 0.9 % IV SOLN
Freq: Once | INTRAVENOUS | Status: AC
  Administered 2018-05-16: 10:00:00 via INTRAVENOUS

## 2018-05-16 MED ORDER — SODIUM CHLORIDE 0.9 % IV SOLN
Freq: Once | INTRAVENOUS | Status: AC
  Administered 2018-05-16: 8 mg via INTRAVENOUS
  Filled 2018-05-16: qty 4

## 2018-05-16 NOTE — Progress Notes (Signed)
Pt presents today for hydration fluids, Magnesium Sulphate 2 grams/ K+ 63meq. VSS. MAR reviewed. Zofran 8mg  and Decadron 8mg . Pt has complaints of nausea and vomiting over the weekend. Pt has radiation therapy at 12:30 today and plans to come everyday this week for hydration IV per pt's words.       Hydration fluids given today per MD orders. Tolerated infusion without adverse affects. Vital signs stable. No complaints at this time. Discharged from clinic via wheel chair. F/U with [facility] as scheduled.

## 2018-05-16 NOTE — Progress Notes (Addendum)
   05/16/18 1200  What Happened  Was fall witnessed? No  Was patient injured? No  Patient found on floor;in bathroom  Found by Staff-comment (mother came to nurses station and notified RN)  Stated prior activity bathroom-unassisted  Follow Up  MD notified yes  Time MD notified 1200  Family notified Yes-comment (Family was present)  Time family notified 1200  Additional tests No  Simple treatment  (none needed)  Progress note created (see row info) Yes  Adult Fall Risk Assessment  Risk Factor Category (scoring not indicated) High fall risk per protocol (document High fall risk)  Fall History: Fall within 6 months prior to admission 0  Elimination; Bowel and/or Urine Incontinence 0  Elimination; Bowel and/or Urine Urgency/Frequency 0  Medications: includes PCA/Opiates, Anti-convulsants, Anti-hypertensives, Diuretics, Hypnotics, Laxatives, Sedatives, and Psychotropics 3  Patient Care Equipment 0  Mobility-Assistance 0  Mobility-Gait 0  Mobility-Sensory Deficit 0  Altered awareness of immediate physical environment 0  Impulsiveness 0  Lack of understanding of one's physical/cognitive limitations 0  Adult Fall Risk Interventions  Required Bundle Interventions *See Row Information* High fall risk - low, moderate, and high requirements implemented  Additional Interventions Room near nurses station  Screening for Fall Injury Risk (To be completed on HIGH fall risk patients) - Assessing Need for Low Bed  Risk For Fall Injury- Low Bed Criteria None identified - Continue screening  Screening for Fall Injury Risk (To be completed on HIGH fall risk patients who do not meet crieteria for Low Bed) - Assessing Need for Floor Mats Only  Risk For Fall Injury- Criteria for Floor Mats None identified - No additional interventions needed    1200-patients mother came to the nurses station to notify us the patient fell in the bathroom.  Patient found on the bathroom floor sitting and stated "I  fell".  Patient also stated she thought the shower curtain was a wall when she reached for it.  Patient was assisted to the toilet to void and then assisted to her chair.  Patient stated she did not hurt herself.  No broken skin or bruising noted.  Dr. Delton Coombes aware and in the room to check on the patient.  No orders received from the oncologist.   Made patient aware to call the staff for assistance with understanding verbalized.

## 2018-05-16 NOTE — Patient Instructions (Signed)
[facility] at [facility]  Discharge Instructions:   _______________________________________________________________  Thank you for choosing [facility] at [facility] to provide your oncology and hematology care.  To afford each patient quality time with our providers, please arrive at least 15 minutes before your scheduled appointment.  You need to re-schedule your appointment if you arrive 10 or more minutes late.  We strive to give you quality time with our providers, and arriving late affects you and other patients whose appointments are after yours.  Also, if you no show three or more times for appointments you may be dismissed from the clinic.  Again, thank you for choosing [facility]. Our hope is that these requests will allow you access to exceptional care and in a timely manner. _______________________________________________________________  If you have questions after your visit, please contact our office at [phone] between the hours of 8:30 a.m. and 5:00 p.m. Voicemails left after 4:30 p.m. will not be returned until the following business day. _______________________________________________________________  For prescription refill requests, have your pharmacy contact our office. _______________________________________________________________  Recommendations made by the consultant and any test results will be sent to your referring physician. _______________________________________________________________ 

## 2018-05-16 NOTE — Progress Notes (Signed)
I spoke with Sharon Berg in [place] and [facility] and they want to have patient come for radiation at 9 am and then patient come here for fluids.  I have called patient and made her aware of schedule change. Patient verbalizes understanding.

## 2018-05-17 ENCOUNTER — Inpatient Hospital Stay (HOSPITAL_COMMUNITY): Payer: 59

## 2018-05-17 ENCOUNTER — Encounter (HOSPITAL_COMMUNITY): Payer: Self-pay | Admitting: Hematology

## 2018-05-17 VITALS — BP 80/45 | HR 70 | Temp 97.6°F | Resp 16 | Wt 115.8 lb

## 2018-05-17 DIAGNOSIS — C3431 Malignant neoplasm of lower lobe, right bronchus or lung: Secondary | ICD-10-CM

## 2018-05-17 DIAGNOSIS — Z5111 Encounter for antineoplastic chemotherapy: Secondary | ICD-10-CM | POA: Diagnosis not present

## 2018-05-17 DIAGNOSIS — R112 Nausea with vomiting, unspecified: Secondary | ICD-10-CM

## 2018-05-17 LAB — CBC WITH DIFFERENTIAL/PLATELET
Abs Immature Granulocytes: 0.07 10*3/uL (ref 0.00–0.07)
Basophils Absolute: 0 10*3/uL (ref 0.0–0.1)
Basophils Relative: 0 %
EOS ABS: 0 10*3/uL (ref 0.0–0.5)
Eosinophils Relative: 0 %
HEMATOCRIT: 39.1 % (ref 36.0–46.0)
Hemoglobin: 12.9 g/dL (ref 12.0–15.0)
Immature Granulocytes: 1 %
Lymphocytes Relative: 7 %
Lymphs Abs: 0.5 10*3/uL — ABNORMAL LOW (ref 0.7–4.0)
MCH: 30.2 pg (ref 26.0–34.0)
MCHC: 33 g/dL (ref 30.0–36.0)
MCV: 91.6 fL (ref 80.0–100.0)
Monocytes Absolute: 0.5 10*3/uL (ref 0.1–1.0)
Monocytes Relative: 7 %
Neutro Abs: 5.9 10*3/uL (ref 1.7–7.7)
Neutrophils Relative %: 85 %
Platelets: 145 10*3/uL — ABNORMAL LOW (ref 150–400)
RBC: 4.27 MIL/uL (ref 3.87–5.11)
RDW: 15.8 % — ABNORMAL HIGH (ref 11.5–15.5)
WBC Morphology: INCREASED
WBC: 7 10*3/uL (ref 4.0–10.5)
nRBC: 0 % (ref 0.0–0.2)

## 2018-05-17 LAB — COMPREHENSIVE METABOLIC PANEL
ALT: 37 U/L (ref 0–44)
AST: 32 U/L (ref 15–41)
Albumin: 2.4 g/dL — ABNORMAL LOW (ref 3.5–5.0)
Alkaline Phosphatase: 66 U/L (ref 38–126)
Anion gap: 10 (ref 5–15)
BUN: 19 mg/dL (ref 6–20)
CO2: 26 mmol/L (ref 22–32)
Calcium: 8.8 mg/dL — ABNORMAL LOW (ref 8.9–10.3)
Chloride: 101 mmol/L (ref 98–111)
Creatinine, Ser: 0.41 mg/dL — ABNORMAL LOW (ref 0.44–1.00)
GFR calc Af Amer: >60 mL/min (ref >60)
GFR calc non Af Amer: >60 mL/min (ref >60)
Glucose, Bld: 123 mg/dL — ABNORMAL HIGH (ref 70–99)
Potassium: 3.1 mmol/L — ABNORMAL LOW (ref 3.5–5.1)
Sodium: 137 mmol/L (ref 135–145)
TOTAL PROTEIN: 5.8 g/dL — AB (ref 6.5–8.1)
Total Bilirubin: 0.8 mg/dL (ref 0.3–1.2)

## 2018-05-17 MED ORDER — SODIUM CHLORIDE 0.9 % IV SOLN
INTRAVENOUS | Status: DC
  Administered 2018-05-17: 11:00:00 via INTRAVENOUS

## 2018-05-17 MED ORDER — SODIUM CHLORIDE 0.9 % IV SOLN
Freq: Once | INTRAVENOUS | Status: AC
  Administered 2018-05-17: 11:00:00 via INTRAVENOUS
  Filled 2018-05-17: qty 1000

## 2018-05-17 MED ORDER — SODIUM CHLORIDE 0.9 % IV SOLN
Freq: Once | INTRAVENOUS | Status: AC
  Administered 2018-05-17: 8 mg via INTRAVENOUS
  Filled 2018-05-17: qty 4

## 2018-05-17 NOTE — Progress Notes (Signed)
Labs drawn today per orders for possible treatment tomorrow. Hydration fluids and meds given today per MD.    Patient tolerated it well without problems. Vitals stable and discharged home from clinic ambulatory. Follow up as scheduled.

## 2018-05-18 ENCOUNTER — Inpatient Hospital Stay (HOSPITAL_BASED_OUTPATIENT_CLINIC_OR_DEPARTMENT_OTHER): Payer: 59 | Admitting: Hematology

## 2018-05-18 ENCOUNTER — Other Ambulatory Visit (HOSPITAL_COMMUNITY): Payer: 59

## 2018-05-18 ENCOUNTER — Inpatient Hospital Stay (HOSPITAL_COMMUNITY): Payer: 59

## 2018-05-18 ENCOUNTER — Other Ambulatory Visit: Payer: Self-pay

## 2018-05-18 ENCOUNTER — Encounter (HOSPITAL_COMMUNITY): Payer: Self-pay | Admitting: Hematology

## 2018-05-18 VITALS — BP 99/59 | HR 80 | Resp 18

## 2018-05-18 VITALS — BP 102/64 | HR 66 | Temp 98.2°F | Resp 16 | Wt 120.0 lb

## 2018-05-18 DIAGNOSIS — R112 Nausea with vomiting, unspecified: Secondary | ICD-10-CM | POA: Diagnosis not present

## 2018-05-18 DIAGNOSIS — C3491 Malignant neoplasm of unspecified part of right bronchus or lung: Secondary | ICD-10-CM | POA: Diagnosis not present

## 2018-05-18 DIAGNOSIS — C3431 Malignant neoplasm of lower lobe, right bronchus or lung: Secondary | ICD-10-CM

## 2018-05-18 DIAGNOSIS — R0789 Other chest pain: Secondary | ICD-10-CM | POA: Diagnosis not present

## 2018-05-18 DIAGNOSIS — R1310 Dysphagia, unspecified: Secondary | ICD-10-CM

## 2018-05-18 DIAGNOSIS — R531 Weakness: Secondary | ICD-10-CM | POA: Diagnosis not present

## 2018-05-18 DIAGNOSIS — Z90710 Acquired absence of both cervix and uterus: Secondary | ICD-10-CM

## 2018-05-18 DIAGNOSIS — F17210 Nicotine dependence, cigarettes, uncomplicated: Secondary | ICD-10-CM

## 2018-05-18 DIAGNOSIS — Z5111 Encounter for antineoplastic chemotherapy: Secondary | ICD-10-CM | POA: Diagnosis not present

## 2018-05-18 DIAGNOSIS — Z79899 Other long term (current) drug therapy: Secondary | ICD-10-CM

## 2018-05-18 MED ORDER — SODIUM CHLORIDE 0.9 % IV SOLN
INTRAVENOUS | Status: DC
  Administered 2018-05-18: 11:00:00 via INTRAVENOUS

## 2018-05-18 MED ORDER — ALPRAZOLAM 1 MG PO TABS: 1.0000 mg | ORAL_TABLET | Freq: Two times a day (BID) | ORAL | 3 refills | Status: AC

## 2018-05-18 MED ORDER — SODIUM CHLORIDE 0.9 % IV SOLN
Freq: Once | INTRAVENOUS | Status: AC
  Administered 2018-05-18: 11:00:00 via INTRAVENOUS
  Filled 2018-05-18: qty 1000

## 2018-05-18 MED ORDER — SODIUM CHLORIDE 0.9 % IV SOLN
8.0000 mg | Freq: Once | INTRAVENOUS | Status: AC
  Administered 2018-05-18: 8 mg via INTRAVENOUS
  Filled 2018-05-18: qty 4

## 2018-05-18 MED ORDER — ONDANSETRON 8 MG PO TBDP: 8.0000 mg | ORAL_TABLET | Freq: Three times a day (TID) | ORAL | 1 refills | Status: AC | PRN

## 2018-05-18 MED ORDER — ALBUTEROL SULFATE HFA 108 (90 BASE) MCG/ACT IN AERS: 2.0000 | INHALATION_SPRAY | Freq: Four times a day (QID) | RESPIRATORY_TRACT | 2 refills | Status: AC | PRN

## 2018-05-18 MED ORDER — HYDROCODONE-ACETAMINOPHEN 5-325 MG PO TABS: 1.0000 | ORAL_TABLET | ORAL | 0 refills | Status: AC | PRN

## 2018-05-18 MED ORDER — SODIUM CHLORIDE 0.9% FLUSH
10.0000 mL | Freq: Once | INTRAVENOUS | Status: AC
  Administered 2018-05-18: 10 mL via INTRAVENOUS

## 2018-05-18 MED ORDER — HEPARIN SOD (PORK) LOCK FLUSH 100 UNIT/ML IV SOLN
500.0000 [IU] | Freq: Once | INTRAVENOUS | Status: AC
  Administered 2018-05-18: 500 [IU] via INTRAVENOUS

## 2018-05-18 NOTE — Patient Instructions (Addendum)
[facility] at [facility] Discharge Instructions   Your last treatment will be next week with labs and MD follow up appointment .  Thank you for choosing [place] at [facility] to provide your oncology and hematology care.  To afford each patient quality time with our provider, please arrive at least 15 minutes before your scheduled appointment time.   If you have a lab appointment with the [facility] please come in thru the  Main Entrance and check in at the main information desk  You need to re-schedule your appointment should you arrive 10 or more minutes late.  We strive to give you quality time with our providers, and arriving late affects you and other patients whose appointments are after yours.  Also, if you no show three or more times for appointments you may be dismissed from the clinic at the providers discretion.     Again, thank you for choosing [facility].  Our hope is that these requests will decrease the amount of time that you wait before being seen by our physicians.       _____________________________________________________________  Should you have questions after your visit to [facility], please contact our office at [phone] between the hours of 8:00 a.m. and 4:30 p.m.  Voicemails left after 4:00 p.m. will not be returned until the following business day.  For prescription refill requests, have your pharmacy contact our office and allow 72 hours.    Cancer Center Support Programs:   > Cancer Support Group  2nd Tuesday of the month 1pm-2pm, Journey Room

## 2018-05-18 NOTE — Assessment & Plan Note (Signed)
1.  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 -She went to the ER on 02/23/2018 when a CT of the chest was done which showed necrotic adenopathy within the mediastinum and right hilum.  A central superior segment right lower lobe nodule likely represents the primary. - She has a 57-pack-year smoking history. -She lost about 12 pounds in the last couple of months.  She does have occasional dysphagia to solid foods. - She does have hemoptysis with bloodstained sputum for the past couple of months. - I reviewed the results of the PET CT scan dated 03/11/2018 which shows posterior right mediastinal confluent necrotic adenopathy.  Right lower lobe mass measuring 4.4 x 4.1 cm, intimately associated with the anterior aspect of the T4 vertebral body, without gross osseous destruction.  There is a 9 mm subcarinal lymph node.  No other metastatic disease was seen. -We discussed the results of the MRI of the brain dated 02/26/2018 which was negative for metastatic disease. -She underwent bronch with EBUS biopsy on 03/21/2018. -I reviewed the pathology report which was consistent with adenocarcinoma. -I discussed the normal prognosis and treatment plan of stage IIIb non-small cell lung cancer.  She will need combination chemoradiation therapy.  We discussed the side effects of the chemotherapy regimen. - Weekly carboplatin and paclitaxel along with radiation therapy started on 04/07/2018. - Week 4 of chemotherapy on 04/29/2018. -She was admitted to the hospital from 1/4 through 04/25/2018 with postobstructive pneumonia, fever and hypotension. - She finished week 5 of chemotherapy on 05/09/2018. - I have added Emend and she did well with nausea.  She is also using dexamethasone as needed in the mornings. - She had fallen couple of times due to weakness in the legs.  I have offered her physical therapy which she declined at this  time. -We will give her prescription for walker with wheels. -Today she is weak to continue chemotherapy.  She has definitely increased her p.o. intake.  Weight has increased. - I will see her back on Monday and try to give her 1 last weekly treatment of carbo Taxol.  She will finish her radiation next Wednesday on 05/26/2018. -We plan to repeat CT scan of the chest in 4 weeks after completion.  She will be planned to start on immunotherapy with durvalumab in 6 weeks.  2.  Anterior chest pain: - She will continue hydrocodone 5 mg every 6 hours as needed for her midsternal pain.  Pain is stable.

## 2018-05-18 NOTE — Patient Instructions (Signed)
[facility]  Discharge Instructions:   _______________________________________________________________  Thank you for choosing [facility] at [facility] to provide your oncology and hematology care.  To afford each patient quality time with our providers, please arrive at least 15 minutes before your scheduled appointment.  You need to re-schedule your appointment if you arrive 10 or more minutes late.  We strive to give you quality time with our providers, and arriving late affects you and other patients whose appointments are after yours.  Also, if you no show three or more times for appointments you may be dismissed from the clinic.  Again, thank you for choosing [facility] at [facility]. Our hope is that these requests will allow you access to exceptional care and in a timely manner. _______________________________________________________________  If you have questions after your visit, please contact our office at [phone] between the hours of 8:30 a.m. and 5:00 p.m. Voicemails left after 4:30 p.m. will not be returned until the following business day. _______________________________________________________________  For prescription refill requests, have your pharmacy contact our office. _______________________________________________________________  Recommendations made by the consultant and any test results will be sent to your referring physician. _______________________________________________________________ 

## 2018-05-18 NOTE — Progress Notes (Signed)
[place], [place] [address]   CLINIC:  Medical Oncology/Hematology  PCP:  [name], [place] [address] [phone]   REASON FOR VISIT: Follow-up for adenocarcinoma of the lung.  CURRENT THERAPY:Chemoradiation   BRIEF ONCOLOGIC HISTORY:    Cancer of right lung ([place])   02/28/2018 Initial Diagnosis    Cancer of right lung ([place])    03/25/2018 Cancer Staging    Staging form: Lung, AJCC 8th Edition - Clinical stage from 03/25/2018: Stage IIIB (cT4, cN2, cM0) - Signed by Derek [name], MD on 03/25/2018    04/07/2018 -  Chemotherapy    The patient had palonosetron (ALOXI) injection 0.25 mg, 0.25 mg, Intravenous,  Once, 5 of 6 cycles Administration: 0.25 mg (04/07/2018), 0.25 mg (05/11/2018), 0.25 mg (04/15/2018), 0.25 mg (04/22/2018), 0.25 mg (04/29/2018) CARBOplatin (PARAPLATIN) 210 mg in sodium chloride 0.9 % 250 mL chemo infusion, 210 mg (100 % of original dose 209.4 mg), Intravenous,  Once, 5 of 6 cycles Dose modification:   (original dose 209.4 mg, Cycle 1),   (original dose 209.4 mg, Cycle 5),   (original dose 209.4 mg, Cycle 2), 209.4 mg (original dose 209.4 mg, Cycle 3),   (original dose 209.4 mg, Cycle 4) Administration: 210 mg (04/07/2018), 210 mg (05/11/2018), 210 mg (04/15/2018), 210 mg (04/22/2018), 210 mg (04/29/2018) PACLitaxel (TAXOL) 78 mg in sodium chloride 0.9 % 250 mL chemo infusion (</= 80mg /m2), 45 mg/m2 = 78 mg, Intravenous,  Once, 5 of 6 cycles Administration: 78 mg (04/07/2018), 78 mg (05/11/2018), 78 mg (04/15/2018), 78 mg (04/22/2018), 78 mg (04/29/2018) fosaprepitant (EMEND) 150 mg, dexamethasone (DECADRON) 20 mg in sodium chloride 0.9 % 145 mL IVPB, , Intravenous,  Once, 1 of 2 cycles Administration:  (05/11/2018)  for chemotherapy treatment.       CANCER STAGING: Cancer Staging Cancer of right lung [facility]) Staging form: Lung, AJCC 8th Edition - Clinical stage from 03/25/2018:  Stage IIIB (cT4, cN2, cM0) - Signed by Derek [name], MD on 03/25/2018    INTERVAL HISTORY:  Ms. Uncapher 53 y.o. female returns for routine follow-up for for adenocarcinoma of the lung. She is here today with her daughter. She is very weak due to her lack of nutrition. She has increased her calorie intake over the past two days. She has gained a few pounds. She still has nausea but it is improved since her last treatment. Denies any vomiting or diarrhea. Denies any new pains. Had not noticed any recent bleeding such as epistaxis, hematuria or hematochezia. Denies recent chest pain on exertion, shortness of breath on minimal exertion, pre-syncopal episodes, or palpitations. Denies any numbness or tingling in hands or feet. Denies any recent fevers, infections, or recent hospitalizations. Patient reports appetite at 25% and energy level at 25%.    REVIEW OF SYSTEMS:  Review of Systems  Gastrointestinal: Positive for nausea.  Neurological: Positive for numbness.  All other systems reviewed and are negative.    PAST MEDICAL/SURGICAL HISTORY:  Past Medical History:  Diagnosis Date  . Anxiety   . Depression   . Lung cancer ([place])   . Migraine   . Pneumonia   . 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 ABDOMINAL HYSTERECTOMY  2000  . PORTACATH PLACEMENT Left 03/14/2018   Procedure: INSERTION PORT-A-CATH;  Surgeon: Aviva Signs, MD;  Location: [facility];  Service: General;  Laterality: Left;  Marland Kitchen VIDEO BRONCHOSCOPY WITH  ENDOBRONCHIAL ULTRASOUND N/A 03/21/2018   Procedure: VIDEO BRONCHOSCOPY WITH ENDOBRONCHIAL ULTRASOUND;  Surgeon: Melrose Nakayama, MD;  Location: [facility];  Service: Thoracic;  Laterality: N/A;     SOCIAL HISTORY:  Social History   Socioeconomic History  . Marital status: Married    Spouse name: [name]  . Number of children: 1  . Years of education: College  . Highest education level: Not on file  Occupational  History  . 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 Financial resource strain: Not hard at all  . Food insecurity:    Worry: Never true    Inability: Never true  . Transportation needs:    Medical: No    Non-medical: No  Tobacco Use  . Smoking status: Former Smoker    Packs/day: 0.12    Years: 38.00    Pack years: 4.56    Types: Cigarettes  . Smokeless tobacco: Never Used  . Tobacco comment: .2 cigs  a day   Substance and Sexual Activity  . Alcohol use: No    Alcohol/week: 0.0 standard drinks  . Drug use: No  . Sexual activity: Yes    Birth control/protection: Surgical  Lifestyle  . Physical activity:    Days per week: 0 days    Minutes per session: 0 min  . Stress: Rather much  Relationships  . Social connections:    Talks on phone: More than three times a week    Gets together: Once a week    Attends religious service: Never    Active member of club or organization: No    Attends meetings of clubs or organizations: Never    Relationship status: Married  . Intimate partner violence:    Fear of current or ex partner: No    Emotionally abused: No    Physically abused: No    Forced sexual activity: No  Other Topics Concern  . Not on file  Social History Narrative   Patient is married with one child.   Patient is right handed.   Patient has college education.   Caffeine use: Patient drinks 1 glass of tea daily.    FAMILY HISTORY:  Family History  Problem Relation Age of Onset  . Hypertension Mother   . Diabetes Father   . Heart disease Maternal Grandmother   . Stroke Maternal Grandmother   . Heart disease Maternal Grandfather   . Dementia Paternal Grandmother   . Dementia Paternal Grandfather     CURRENT MEDICATIONS:  Outpatient Encounter Medications as of 05/18/2018  Medication Sig  . ALPRAZolam (XANAX) 1 MG tablet Take 1 tablet (1 mg total) by mouth 2 (two) times daily. TAKE 1/2 TO 1 TABLET TWICE A DAY FOR  ANXIETY  . CARBOPLATIN IV Inject into the vein once a week.  . ciprofloxacin (CIPRO) 500 MG tablet Take 1 tablet (500 mg total) by mouth 2 (two) times daily.  Marland Kitchen dexamethasone (DECADRON) 4 MG tablet Take 1 tablet (4 mg total) by mouth daily.  Marland Kitchen HYDROcodone-acetaminophen (NORCO/VICODIN) 5-325 MG tablet Take 1 tablet by mouth every 4 (four) hours as needed for moderate pain.  Marland Kitchen lidocaine (XYLOCAINE) 2 % solution   . lidocaine-prilocaine (EMLA) cream Apply small amount to port site one hour prior to appointment. Cover with plastic wrap.  . magic mouthwash SOLN Take 5 mLs by mouth.  Marland Kitchen omeprazole (PRILOSEC OTC) 20 MG tablet Take 20 mg by mouth daily.   . ondansetron (  ZOFRAN ODT) 8 MG disintegrating tablet Take 1 tablet (8 mg total) by mouth every 8 (eight) hours as needed for nausea or vomiting (may cause constipation).  Marland Kitchen PACLITAXEL IV Inject 78 mg into the vein once a week.  . potassium chloride SA (K-DUR,KLOR-CON) 20 MEQ tablet Take 1 tablet (20 mEq total) by mouth 2 (two) times daily.  . prochlorperazine (COMPAZINE) 10 MG tablet Take 1 tablet (10 mg total) by mouth every 6 (six) hours as needed (Nausea or vomiting).  Marland Kitchen scopolamine (TRANSDERM-SCOP) 1 MG/3DAYS Place 1 patch (1.5 mg total) onto the skin every 3 (three) days.  Marland Kitchen sertraline (ZOLOFT) 100 MG tablet TAKE 1 AND 1/2 TABLETS ONCE DAILY  . traZODone (DESYREL) 100 MG tablet Take 3 tablets (300 mg total) by mouth at bedtime.  Marland Kitchen albuterol (PROVENTIL HFA;VENTOLIN HFA) 108 (90 Base) MCG/ACT inhaler Inhale 2 puffs into the lungs every 6 (six) hours as needed for wheezing or shortness of breath.   No facility-administered encounter medications on file as of 05/18/2018.     ALLERGIES:  Allergies  Allergen Reactions  . Levaquin [Levofloxacin] Other (See Comments)    Myalgia  . Codeine Nausea And Vomiting     PHYSICAL EXAM:  ECOG Performance status: 1  Vitals:   05/18/18 0900  BP: 102/64  Pulse: 66  Resp: 16  Temp: 98.2 F (36.8 C)    SpO2: 95%   Filed Weights   05/18/18 0900  Weight: 120 lb (54.4 kg)    Physical Exam Constitutional:      Appearance: Normal appearance. She is normal weight.  Cardiovascular:     Rate and Rhythm: Normal rate and regular rhythm.     Heart sounds: Normal heart sounds.  Pulmonary:     Effort: Pulmonary effort is normal.     Breath sounds: Normal breath sounds.  Musculoskeletal: Normal range of motion.  Skin:    General: Skin is warm and dry.  Neurological:     Mental Status: She is alert and oriented to person, place, and time. Mental status is at baseline.  Psychiatric:        Mood and Affect: Mood normal.        Behavior: Behavior normal.        Thought Content: Thought content normal.        Judgment: Judgment normal.      LABORATORY DATA:  I have reviewed the labs as listed.  CBC    Component Value Date/Time   WBC 7.0 05/17/2018 1033   RBC 4.27 05/17/2018 1033   HGB 12.9 05/17/2018 1033   HGB 18.1 (H) 09/25/2017 0829   HCT 39.1 05/17/2018 1033   HCT 52.8 (H) 09/25/2017 0829   PLT 145 (L) 05/17/2018 1033   PLT 239 09/25/2017 0829   MCV 91.6 05/17/2018 1033   MCV 91 09/25/2017 0829   MCH 30.2 05/17/2018 1033   MCHC 33.0 05/17/2018 1033   RDW 15.8 (H) 05/17/2018 1033   RDW 13.8 09/25/2017 0829   LYMPHSABS 0.5 (L) 05/17/2018 1033   LYMPHSABS 3.4 (H) 09/25/2017 0829   MONOABS 0.5 05/17/2018 1033   EOSABS 0.0 05/17/2018 1033   EOSABS 0.2 09/25/2017 0829   BASOSABS 0.0 05/17/2018 1033   BASOSABS 0.0 09/25/2017 0829   CMP Latest Ref Rng & Units 05/17/2018 05/13/2018 05/11/2018  Glucose 70 - 99 mg/dL 123(H) 103(H) 110(H)  BUN 6 - 20 mg/dL 19 16 16   Creatinine 0.44 - 1.00 mg/dL 0.41(L) 0.47 0.53  Sodium 135 - 145 mmol/L 137  133(L) 139  Potassium 3.5 - 5.1 mmol/L 3.1(L) 3.3(L) 2.5(LL)  Chloride 98 - 111 mmol/L 101 101 99  CO2 22 - 32 mmol/L 26 24 30   Calcium 8.9 - 10.3 mg/dL 8.8(L) 8.3(L) 9.1  Total Protein 6.5 - 8.1 g/dL 5.8(L) 5.6(L) 6.1(L)  Total Bilirubin  0.3 - 1.2 mg/dL 0.8 1.1 0.8  Alkaline Phos 38 - 126 U/L 66 71 70  AST 15 - 41 U/L 32 35 54(H)  ALT 0 - 44 U/L 37 63(H) 57(H)       DIAGNOSTIC IMAGING:  I have independently reviewed the scans and discussed with the patient.   I have reviewed Francene Finders, NP's note and agree with the documentation.  I personally performed a face-to-face visit, made revisions and my assessment and plan is as follows.    ASSESSMENT & PLAN:   Cancer of right lung ([place]) 1.  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 -She went to the ER on 02/23/2018 when a CT of the chest was done which showed necrotic adenopathy within the mediastinum and right hilum.  A central superior segment right lower lobe nodule likely represents the primary. - She has a 57-pack-year smoking history. -She lost about 12 pounds in the last couple of months.  She does have occasional dysphagia to solid foods. - She does have hemoptysis with bloodstained sputum for the past couple of months. - I reviewed the results of the PET CT scan dated 03/11/2018 which shows posterior right mediastinal confluent necrotic adenopathy.  Right lower lobe mass measuring 4.4 x 4.1 cm, intimately associated with the anterior aspect of the T4 vertebral body, without gross osseous destruction.  There is a 9 mm subcarinal lymph node.  No other metastatic disease was seen. -We discussed the results of the MRI of the brain dated 02/26/2018 which was negative for metastatic disease. -She underwent bronch with EBUS biopsy on 03/21/2018. -I reviewed the pathology report which was consistent with adenocarcinoma. -I discussed the normal prognosis and treatment plan of stage IIIb non-small cell lung cancer.  She will need combination chemoradiation therapy.  We discussed the side effects of the chemotherapy regimen. - Weekly carboplatin and paclitaxel along with radiation therapy  started on 04/07/2018. - Week 4 of chemotherapy on 04/29/2018. -She was admitted to the hospital from 1/4 through 04/25/2018 with postobstructive pneumonia, fever and hypotension. - She finished week 5 of chemotherapy on 05/09/2018. - I have added Emend and she did well with nausea.  She is also using dexamethasone as needed in the mornings. - She had fallen couple of times due to weakness in the legs.  I have offered her physical therapy which she declined at this time. -We will give her prescription for walker with wheels. -Today she is weak to continue chemotherapy.  She has definitely increased her p.o. intake.  Weight has increased. - I will see her back on Monday and try to give her 1 last weekly treatment of carbo Taxol.  She will finish her radiation next Wednesday on 05/26/2018. -We plan to repeat CT scan of the chest in 4 weeks after completion.  She will be planned to start on immunotherapy with durvalumab in 6 weeks.  2.  Anterior chest pain: - She will continue hydrocodone 5 mg every 6 hours as needed for her midsternal pain.  Pain is stable.        Orders placed this encounter:  Orders Placed This Encounter  Procedures  . For home use only DME 4 wheeled rolling walker with seat      Derek [name], MD [place] [phone]

## 2018-05-18 NOTE — Progress Notes (Signed)
Patient seen by the oncologist with lab review and hydration only.  Family with patient and no s/s of distress noted.   Patient tolerated hydration with no complaints voiced. Port site clean with no bruising or swelling noted at site.  Good blood return noted before and after hydration.  Band aid applied.  VSS with discharge and left by wheelchair with family with no s/s of distress noted.

## 2018-05-19 ENCOUNTER — Other Ambulatory Visit (HOSPITAL_COMMUNITY): Payer: Self-pay | Admitting: Emergency Medicine

## 2018-05-19 ENCOUNTER — Other Ambulatory Visit (HOSPITAL_COMMUNITY): Payer: Self-pay | Admitting: *Deleted

## 2018-05-19 ENCOUNTER — Ambulatory Visit (HOSPITAL_COMMUNITY): Payer: 59

## 2018-05-19 DIAGNOSIS — C3431 Malignant neoplasm of lower lobe, right bronchus or lung: Secondary | ICD-10-CM

## 2018-05-19 NOTE — Progress Notes (Signed)
I received a call from [place] , Therapist, sports at [employer] in [place] stating that patient has home health needs as asked if we could send the referral.    I have contacted patient and she is willing to receive the services from [facility].   Orders have been placed.

## 2018-05-20 ENCOUNTER — Ambulatory Visit (HOSPITAL_COMMUNITY): Payer: 59

## 2018-05-20 ENCOUNTER — Inpatient Hospital Stay (HOSPITAL_COMMUNITY): Payer: 59

## 2018-05-20 ENCOUNTER — Encounter (HOSPITAL_COMMUNITY): Payer: Self-pay

## 2018-05-20 ENCOUNTER — Other Ambulatory Visit (HOSPITAL_COMMUNITY): Payer: 59

## 2018-05-20 VITALS — BP 83/41 | HR 88 | Temp 98.2°F | Resp 18

## 2018-05-20 DIAGNOSIS — C3431 Malignant neoplasm of lower lobe, right bronchus or lung: Secondary | ICD-10-CM

## 2018-05-20 MED ORDER — SODIUM CHLORIDE 0.9% FLUSH
10.0000 mL | Freq: Once | INTRAVENOUS | Status: AC
  Administered 2018-05-20: 10 mL via INTRAVENOUS

## 2018-05-20 MED ORDER — SODIUM CHLORIDE 0.9 % IV SOLN
Freq: Once | INTRAVENOUS | Status: AC
  Administered 2018-05-20: 10:00:00 via INTRAVENOUS
  Filled 2018-05-20: qty 1000

## 2018-05-20 MED ORDER — SODIUM CHLORIDE 0.9 % IV SOLN
Freq: Once | INTRAVENOUS | Status: AC
  Administered 2018-05-20: 8 mg via INTRAVENOUS
  Filled 2018-05-20: qty 4

## 2018-05-20 MED ORDER — SODIUM CHLORIDE 0.9 % IV SOLN
INTRAVENOUS | Status: DC
  Administered 2018-05-20: 10:00:00 via INTRAVENOUS

## 2018-05-20 MED ORDER — HEPARIN SOD (PORK) LOCK FLUSH 100 UNIT/ML IV SOLN
500.0000 [IU] | Freq: Once | INTRAVENOUS | Status: AC
  Administered 2018-05-20: 500 [IU] via INTRAVENOUS

## 2018-05-20 NOTE — Progress Notes (Signed)
Patient to treatment room for hydration and nausea medications.  Patient stated ate a good dinner yesterday with no nausea or vomiting.  Patient given call bell and instructed to call for any assistance with understanding verbalized.  Husband at the patients side.  No s/s of distress noted.    Patient tolerated hydration with no complaints voiced.  Port site clean and dry with no bruising or swelling noted at site.  Good blood return noted before and after hydration.  Band aid applied.  Patient voided x 2 during stay.  VSS with discharge and left by wheelchair with family with no s/s of distress noted.

## 2018-05-20 NOTE — Patient Instructions (Signed)
[facility]  Discharge Instructions:   _______________________________________________________________  Thank you for choosing [facility] to provide your oncology and hematology care.  To afford each patient quality time with our providers, please arrive at least 15 minutes before your scheduled appointment.  You need to re-schedule your appointment if you arrive 10 or more minutes late.  We strive to give you quality time with our providers, and arriving late affects you and other patients whose appointments are after yours.  Also, if you no show three or more times for appointments you may be dismissed from the clinic.  Again, thank you for choosing [facility]. Our hope is that these requests will allow you access to exceptional care and in a timely manner. _______________________________________________________________  If you have questions after your visit, please contact our office at [phone] between the hours of 8:30 a.m. and 5:00 p.m. Voicemails left after 4:30 p.m. will not be returned until the following business day. _______________________________________________________________  For prescription refill requests, have your pharmacy contact our office. _______________________________________________________________  Recommendations made by the consultant and any test results will be sent to your referring physician. _______________________________________________________________ 

## 2018-05-20 NOTE — Progress Notes (Signed)
Nutrition Follow-up:  Patient with right lung cancer.  Currently receiving chemotherapy and radiation therapy.  Patient reports last chemo and radiation will be this coming Wednesday, 2/5.  Met with patient and husband this am during infusion.  Patient reports she no change this week, still with poor appetite, nausea, vomiting.  Has received fluids every day this week except yesterday.  Husband reports drank 1000 calorie smoothie this am (whole milk, ice cream, benecalorie).  "I am trying. "  "As soon as I finish my treatment I am going to eat everything in [place]."   Medications: reviewed  Labs: reviewed  Anthropometrics:   Weight 120 lb slight decrease from 121 lb on 1/24.     NUTRITION DIAGNOSIS: Inadequate oral intake continues   INTERVENTION:  Offered encouragement and support.   Patient appreciative of visit.     MONITORING, EVALUATION, GOAL: weight trends, intake   NEXT VISIT: as needed RD on PAL on 2/7   Sharon Berg B. Zenia Resides, [place], [place] Registered Dietitian [phone] (pager)

## 2018-05-23 ENCOUNTER — Encounter (HOSPITAL_COMMUNITY): Payer: Self-pay | Admitting: Emergency Medicine

## 2018-05-23 ENCOUNTER — Inpatient Hospital Stay (HOSPITAL_COMMUNITY): Payer: 59 | Attending: Hematology

## 2018-05-23 ENCOUNTER — Other Ambulatory Visit: Payer: Self-pay

## 2018-05-23 ENCOUNTER — Encounter (HOSPITAL_COMMUNITY): Payer: Self-pay

## 2018-05-23 ENCOUNTER — Inpatient Hospital Stay (HOSPITAL_COMMUNITY)
Admission: EM | Admit: 2018-05-23 | Discharge: 2018-06-19 | DRG: 870 | Disposition: E | Payer: 59 | Attending: Internal Medicine | Admitting: Internal Medicine

## 2018-05-23 ENCOUNTER — Emergency Department (HOSPITAL_COMMUNITY): Payer: 59

## 2018-05-23 DIAGNOSIS — J860 Pyothorax with fistula: Secondary | ICD-10-CM | POA: Diagnosis present

## 2018-05-23 DIAGNOSIS — J440 Chronic obstructive pulmonary disease with acute lower respiratory infection: Secondary | ICD-10-CM | POA: Diagnosis present

## 2018-05-23 DIAGNOSIS — L89152 Pressure ulcer of sacral region, stage 2: Secondary | ICD-10-CM | POA: Diagnosis present

## 2018-05-23 DIAGNOSIS — J189 Pneumonia, unspecified organism: Secondary | ICD-10-CM | POA: Diagnosis present

## 2018-05-23 DIAGNOSIS — Z978 Presence of other specified devices: Secondary | ICD-10-CM | POA: Diagnosis not present

## 2018-05-23 DIAGNOSIS — E871 Hypo-osmolality and hyponatremia: Secondary | ICD-10-CM | POA: Diagnosis not present

## 2018-05-23 DIAGNOSIS — D6959 Other secondary thrombocytopenia: Secondary | ICD-10-CM | POA: Diagnosis present

## 2018-05-23 DIAGNOSIS — C3411 Malignant neoplasm of upper lobe, right bronchus or lung: Secondary | ICD-10-CM | POA: Diagnosis not present

## 2018-05-23 DIAGNOSIS — K8020 Calculus of gallbladder without cholecystitis without obstruction: Secondary | ICD-10-CM | POA: Diagnosis present

## 2018-05-23 DIAGNOSIS — F329 Major depressive disorder, single episode, unspecified: Secondary | ICD-10-CM | POA: Diagnosis present

## 2018-05-23 DIAGNOSIS — R64 Cachexia: Secondary | ICD-10-CM | POA: Diagnosis present

## 2018-05-23 DIAGNOSIS — R109 Unspecified abdominal pain: Secondary | ICD-10-CM

## 2018-05-23 DIAGNOSIS — R599 Enlarged lymph nodes, unspecified: Secondary | ICD-10-CM | POA: Diagnosis present

## 2018-05-23 DIAGNOSIS — C3491 Malignant neoplasm of unspecified part of right bronchus or lung: Secondary | ICD-10-CM | POA: Diagnosis present

## 2018-05-23 DIAGNOSIS — Z681 Body mass index (BMI) 19 or less, adult: Secondary | ICD-10-CM | POA: Diagnosis not present

## 2018-05-23 DIAGNOSIS — M7989 Other specified soft tissue disorders: Secondary | ICD-10-CM | POA: Diagnosis not present

## 2018-05-23 DIAGNOSIS — G40209 Localization-related (focal) (partial) symptomatic epilepsy and epileptic syndromes with complex partial seizures, not intractable, without status epilepticus: Secondary | ICD-10-CM | POA: Diagnosis present

## 2018-05-23 DIAGNOSIS — Z515 Encounter for palliative care: Secondary | ICD-10-CM

## 2018-05-23 DIAGNOSIS — A419 Sepsis, unspecified organism: Secondary | ICD-10-CM | POA: Diagnosis not present

## 2018-05-23 DIAGNOSIS — R112 Nausea with vomiting, unspecified: Secondary | ICD-10-CM | POA: Diagnosis not present

## 2018-05-23 DIAGNOSIS — Z66 Do not resuscitate: Secondary | ICD-10-CM | POA: Diagnosis not present

## 2018-05-23 DIAGNOSIS — J9690 Respiratory failure, unspecified, unspecified whether with hypoxia or hypercapnia: Secondary | ICD-10-CM

## 2018-05-23 DIAGNOSIS — R748 Abnormal levels of other serum enzymes: Secondary | ICD-10-CM

## 2018-05-23 DIAGNOSIS — Z79899 Other long term (current) drug therapy: Secondary | ICD-10-CM

## 2018-05-23 DIAGNOSIS — T451X5A Adverse effect of antineoplastic and immunosuppressive drugs, initial encounter: Secondary | ICD-10-CM | POA: Diagnosis present

## 2018-05-23 DIAGNOSIS — C3401 Malignant neoplasm of right main bronchus: Secondary | ICD-10-CM | POA: Diagnosis not present

## 2018-05-23 DIAGNOSIS — Z7189 Other specified counseling: Secondary | ICD-10-CM | POA: Diagnosis not present

## 2018-05-23 DIAGNOSIS — C3431 Malignant neoplasm of lower lobe, right bronchus or lung: Secondary | ICD-10-CM | POA: Diagnosis not present

## 2018-05-23 DIAGNOSIS — F32A Depression, unspecified: Secondary | ICD-10-CM | POA: Diagnosis present

## 2018-05-23 DIAGNOSIS — E870 Hyperosmolality and hypernatremia: Secondary | ICD-10-CM | POA: Diagnosis present

## 2018-05-23 DIAGNOSIS — Z4659 Encounter for fitting and adjustment of other gastrointestinal appliance and device: Secondary | ICD-10-CM

## 2018-05-23 DIAGNOSIS — E876 Hypokalemia: Secondary | ICD-10-CM | POA: Diagnosis present

## 2018-05-23 DIAGNOSIS — Z8249 Family history of ischemic heart disease and other diseases of the circulatory system: Secondary | ICD-10-CM

## 2018-05-23 DIAGNOSIS — K859 Acute pancreatitis without necrosis or infection, unspecified: Secondary | ICD-10-CM | POA: Diagnosis not present

## 2018-05-23 DIAGNOSIS — K760 Fatty (change of) liver, not elsewhere classified: Secondary | ICD-10-CM | POA: Diagnosis present

## 2018-05-23 DIAGNOSIS — J9601 Acute respiratory failure with hypoxia: Secondary | ICD-10-CM | POA: Diagnosis present

## 2018-05-23 DIAGNOSIS — E43 Unspecified severe protein-calorie malnutrition: Secondary | ICD-10-CM

## 2018-05-23 DIAGNOSIS — Z87891 Personal history of nicotine dependence: Secondary | ICD-10-CM

## 2018-05-23 DIAGNOSIS — Z789 Other specified health status: Secondary | ICD-10-CM

## 2018-05-23 DIAGNOSIS — Z881 Allergy status to other antibiotic agents status: Secondary | ICD-10-CM

## 2018-05-23 DIAGNOSIS — Z90710 Acquired absence of both cervix and uterus: Secondary | ICD-10-CM

## 2018-05-23 DIAGNOSIS — J9600 Acute respiratory failure, unspecified whether with hypoxia or hypercapnia: Secondary | ICD-10-CM | POA: Diagnosis not present

## 2018-05-23 DIAGNOSIS — F419 Anxiety disorder, unspecified: Secondary | ICD-10-CM | POA: Diagnosis present

## 2018-05-23 DIAGNOSIS — Y95 Nosocomial condition: Secondary | ICD-10-CM | POA: Diagnosis present

## 2018-05-23 DIAGNOSIS — Z9911 Dependence on respirator [ventilator] status: Secondary | ICD-10-CM

## 2018-05-23 DIAGNOSIS — Z885 Allergy status to narcotic agent status: Secondary | ICD-10-CM

## 2018-05-23 DIAGNOSIS — L8990 Pressure ulcer of unspecified site, unspecified stage: Secondary | ICD-10-CM

## 2018-05-23 DIAGNOSIS — R52 Pain, unspecified: Secondary | ICD-10-CM | POA: Diagnosis not present

## 2018-05-23 DIAGNOSIS — Z9221 Personal history of antineoplastic chemotherapy: Secondary | ICD-10-CM

## 2018-05-23 DIAGNOSIS — R6520 Severe sepsis without septic shock: Secondary | ICD-10-CM | POA: Diagnosis present

## 2018-05-23 DIAGNOSIS — Z923 Personal history of irradiation: Secondary | ICD-10-CM

## 2018-05-23 LAB — COMPREHENSIVE METABOLIC PANEL
ALT: 39 U/L (ref 0–44)
AST: 50 U/L — AB (ref 15–41)
Albumin: 1.9 g/dL — ABNORMAL LOW (ref 3.5–5.0)
Alkaline Phosphatase: 63 U/L (ref 38–126)
Anion gap: 10 (ref 5–15)
BUN: 31 mg/dL — ABNORMAL HIGH (ref 6–20)
CO2: 22 mmol/L (ref 22–32)
CREATININE: 0.59 mg/dL (ref 0.44–1.00)
Calcium: 7.6 mg/dL — ABNORMAL LOW (ref 8.9–10.3)
Chloride: 103 mmol/L (ref 98–111)
GFR calc non Af Amer: >60 mL/min (ref >60)
Glucose, Bld: 56 mg/dL — ABNORMAL LOW (ref 70–99)
Potassium: 3.3 mmol/L — ABNORMAL LOW (ref 3.5–5.1)
Sodium: 135 mmol/L (ref 135–145)
Total Bilirubin: 0.8 mg/dL (ref 0.3–1.2)
Total Protein: 4.7 g/dL — ABNORMAL LOW (ref 6.5–8.1)

## 2018-05-23 LAB — BLOOD GAS, ARTERIAL
Acid-base deficit: 2.6 mmol/L — ABNORMAL HIGH (ref 0.0–2.0)
Bicarbonate: 21.5 mmol/L (ref 20.0–28.0)
FIO2: 80
O2 Saturation: 86.1 %
Patient temperature: 37
pCO2 arterial: 50.9 mmHg — ABNORMAL HIGH (ref 32.0–48.0)
pH, Arterial: 7.28 — ABNORMAL LOW (ref 7.350–7.450)
pO2, Arterial: 66.6 mmHg — ABNORMAL LOW (ref 83.0–108.0)

## 2018-05-23 LAB — CBC WITH DIFFERENTIAL/PLATELET
Abs Immature Granulocytes: 0.08 10*3/uL — ABNORMAL HIGH (ref 0.00–0.07)
Basophils Absolute: 0 10*3/uL (ref 0.0–0.1)
Basophils Relative: 1 %
EOS ABS: 1 10*3/uL — AB (ref 0.0–0.5)
Eosinophils Relative: 17 %
HCT: 34.3 % — ABNORMAL LOW (ref 36.0–46.0)
Hemoglobin: 11 g/dL — ABNORMAL LOW (ref 12.0–15.0)
IMMATURE GRANULOCYTES: 1 %
Lymphocytes Relative: 1 %
Lymphs Abs: 0.1 10*3/uL — ABNORMAL LOW (ref 0.7–4.0)
MCH: 29.8 pg (ref 26.0–34.0)
MCHC: 32.1 g/dL (ref 30.0–36.0)
MCV: 93 fL (ref 80.0–100.0)
Monocytes Absolute: 0.1 10*3/uL (ref 0.1–1.0)
Monocytes Relative: 2 %
NEUTROS PCT: 78 %
Neutro Abs: 4.8 10*3/uL (ref 1.7–7.7)
Platelets: 128 10*3/uL — ABNORMAL LOW (ref 150–400)
RBC: 3.69 MIL/uL — ABNORMAL LOW (ref 3.87–5.11)
RDW: 16.8 % — AB (ref 11.5–15.5)
WBC MORPHOLOGY: INCREASED
WBC: 6.1 10*3/uL (ref 4.0–10.5)
nRBC: 0 % (ref 0.0–0.2)

## 2018-05-23 LAB — PROTIME-INR
INR: 1.15
Prothrombin Time: 14.6 seconds (ref 11.4–15.2)

## 2018-05-23 LAB — LACTIC ACID, PLASMA
Lactic Acid, Venous: 2.3 mmol/L (ref 0.5–1.9)
Lactic Acid, Venous: 2.8 mmol/L (ref 0.5–1.9)
Lactic Acid, Venous: 2.7 mmol/L (ref 0.5–1.9)

## 2018-05-23 LAB — CBG MONITORING, ED: Glucose-Capillary: 76 mg/dL (ref 70–99)

## 2018-05-23 LAB — MAGNESIUM: Magnesium: 2.4 mg/dL (ref 1.7–2.4)

## 2018-05-23 MED ORDER — SODIUM CHLORIDE 0.9 % IV SOLN
2.0000 g | Freq: Once | INTRAVENOUS | Status: AC
  Administered 2018-05-23: 2 g via INTRAVENOUS
  Filled 2018-05-23: qty 2

## 2018-05-23 MED ORDER — ALPRAZOLAM 0.5 MG PO TABS
0.5000 mg | ORAL_TABLET | Freq: Two times a day (BID) | ORAL | Status: DC | PRN
  Administered 2018-05-24 – 2018-05-25 (×3): 0.5 mg via ORAL
  Filled 2018-05-23 (×3): qty 1

## 2018-05-23 MED ORDER — IPRATROPIUM-ALBUTEROL 0.5-2.5 (3) MG/3ML IN SOLN
3.0000 mL | Freq: Four times a day (QID) | RESPIRATORY_TRACT | Status: DC
  Administered 2018-05-23: 3 mL via RESPIRATORY_TRACT

## 2018-05-23 MED ORDER — SODIUM CHLORIDE 0.9 % IV SOLN
Freq: Once | INTRAVENOUS | Status: AC
  Administered 2018-05-23: 15:00:00 via INTRAVENOUS
  Filled 2018-05-23: qty 1000

## 2018-05-23 MED ORDER — SODIUM CHLORIDE 0.9 % IV SOLN
Freq: Once | INTRAVENOUS | Status: AC
  Administered 2018-05-23: 14:00:00 via INTRAVENOUS

## 2018-05-23 MED ORDER — IPRATROPIUM-ALBUTEROL 0.5-2.5 (3) MG/3ML IN SOLN
3.0000 mL | Freq: Four times a day (QID) | RESPIRATORY_TRACT | Status: DC
  Administered 2018-05-23 – 2018-05-24 (×4): 3 mL via RESPIRATORY_TRACT
  Filled 2018-05-23 (×4): qty 3

## 2018-05-23 MED ORDER — VANCOMYCIN HCL IN DEXTROSE 1-5 GM/200ML-% IV SOLN
1000.0000 mg | Freq: Once | INTRAVENOUS | Status: AC
  Administered 2018-05-23: 1000 mg via INTRAVENOUS
  Filled 2018-05-23: qty 200

## 2018-05-23 MED ORDER — ONDANSETRON HCL 4 MG PO TABS: 4.0000 mg | ORAL_TABLET | Freq: Four times a day (QID) | ORAL | Status: DC | PRN

## 2018-05-23 MED ORDER — SODIUM CHLORIDE 0.9 % IV SOLN
Freq: Once | INTRAVENOUS | Status: AC
  Administered 2018-05-23: 8 mg via INTRAVENOUS
  Filled 2018-05-23: qty 4

## 2018-05-23 MED ORDER — HYDROCODONE-ACETAMINOPHEN 5-325 MG PO TABS
1.0000 | ORAL_TABLET | ORAL | Status: DC | PRN
  Administered 2018-05-23 – 2018-05-24 (×3): 1 via ORAL
  Filled 2018-05-23 (×7): qty 1

## 2018-05-23 MED ORDER — LACTATED RINGERS IV BOLUS (SEPSIS)
250.0000 mL | Freq: Once | INTRAVENOUS | Status: AC
  Administered 2018-05-23: 250 mL via INTRAVENOUS

## 2018-05-23 MED ORDER — SERTRALINE HCL 50 MG PO TABS
150.0000 mg | ORAL_TABLET | Freq: Every morning | ORAL | Status: DC
  Administered 2018-05-24 – 2018-05-25 (×2): 150 mg via ORAL
  Filled 2018-05-23 (×2): qty 3

## 2018-05-23 MED ORDER — PIPERACILLIN-TAZOBACTAM 3.375 G IVPB
3.3750 g | Freq: Three times a day (TID) | INTRAVENOUS | Status: DC
  Administered 2018-05-23 – 2018-05-26 (×8): 3.375 g via INTRAVENOUS
  Filled 2018-05-23 (×9): qty 50

## 2018-05-23 MED ORDER — LACTATED RINGERS IV BOLUS (SEPSIS)
500.0000 mL | Freq: Once | INTRAVENOUS | Status: AC
  Administered 2018-05-23: 500 mL via INTRAVENOUS

## 2018-05-23 MED ORDER — ONDANSETRON HCL 4 MG/2ML IJ SOLN
4.0000 mg | Freq: Four times a day (QID) | INTRAMUSCULAR | Status: DC | PRN
  Administered 2018-05-25: 4 mg via INTRAVENOUS
  Filled 2018-05-23 (×2): qty 2

## 2018-05-23 MED ORDER — ENOXAPARIN SODIUM 40 MG/0.4ML ~~LOC~~ SOLN
40.0000 mg | SUBCUTANEOUS | Status: DC
  Administered 2018-05-23 – 2018-05-25 (×3): 40 mg via SUBCUTANEOUS
  Filled 2018-05-23 (×3): qty 0.4

## 2018-05-23 MED ORDER — IPRATROPIUM-ALBUTEROL 0.5-2.5 (3) MG/3ML IN SOLN
RESPIRATORY_TRACT | Status: AC
  Filled 2018-05-23: qty 3

## 2018-05-23 MED ORDER — IPRATROPIUM-ALBUTEROL 0.5-2.5 (3) MG/3ML IN SOLN: 3.0000 mL | RESPIRATORY_TRACT | Status: DC | PRN

## 2018-05-23 MED ORDER — FLUCONAZOLE 100 MG PO TABS
100.0000 mg | ORAL_TABLET | Freq: Every day | ORAL | Status: DC
  Administered 2018-05-24 – 2018-05-29 (×5): 100 mg via ORAL
  Filled 2018-05-23 (×6): qty 1

## 2018-05-23 MED ORDER — POLYETHYLENE GLYCOL 3350 17 G PO PACK
17.0000 g | PACK | Freq: Every day | ORAL | Status: DC | PRN
  Filled 2018-05-23: qty 1

## 2018-05-23 MED ORDER — SODIUM CHLORIDE 0.9 % IV BOLUS
1000.0000 mL | Freq: Once | INTRAVENOUS | Status: AC
  Administered 2018-05-23: 1000 mL via INTRAVENOUS

## 2018-05-23 MED ORDER — LACTATED RINGERS IV BOLUS (SEPSIS)
1000.0000 mL | Freq: Once | INTRAVENOUS | Status: AC
  Administered 2018-05-23: 1000 mL via INTRAVENOUS

## 2018-05-23 MED ORDER — VANCOMYCIN HCL 500 MG IV SOLR
500.0000 mg | Freq: Two times a day (BID) | INTRAVENOUS | Status: DC
  Administered 2018-05-24 – 2018-05-25 (×2): 500 mg via INTRAVENOUS
  Filled 2018-05-23 (×5): qty 500

## 2018-05-23 MED ORDER — KCL IN DEXTROSE-NACL 40-5-0.9 MEQ/L-%-% IV SOLN
INTRAVENOUS | Status: AC
  Administered 2018-05-23 – 2018-05-24 (×2): via INTRAVENOUS
  Filled 2018-05-23 (×7): qty 1000

## 2018-05-23 MED ORDER — TRAZODONE HCL 50 MG PO TABS
300.0000 mg | ORAL_TABLET | Freq: Every day | ORAL | Status: DC
  Administered 2018-05-25: 300 mg via ORAL
  Filled 2018-05-23: qty 6

## 2018-05-23 MED ORDER — SODIUM CHLORIDE 0.9 % IV SOLN: 2.0000 g | Freq: Three times a day (TID) | INTRAVENOUS | Status: DC

## 2018-05-23 NOTE — ED Notes (Signed)
Admitting MD notified of pt's O2 sat despite being on 15L NRB

## 2018-05-23 NOTE — ED Notes (Signed)
Pt unable to provide urine sample.

## 2018-05-23 NOTE — ED Notes (Signed)
AC to bring D5 with 40K.

## 2018-05-23 NOTE — ED Notes (Signed)
Date and time results received: 06/06/2018 8:42 PM   Test:  Lactic Acid, Venous    Component Value Date/Time   LATICACIDVEN 2.8 [facility]) 06/05/2018 1920     Name of Provider Notified: Emokpae  Orders Received? Or Actions Taken?: paged MD

## 2018-05-23 NOTE — ED Notes (Signed)
CRITICAL VALUE ALERT  Critical Value:  Lactic Acid - 2.3  Date & Time Notied:  06/15/2018   1813  Provider Notified: Dr Sabra Heck  Orders Received/Actions taken:

## 2018-05-23 NOTE — H&P (Addendum)
History and Physical    Sharon Berg:[id number] DOB: September 21, 1965 DOA: 05/22/2018  PCP: [name], MD   Patient coming from: Home  I have personally briefly reviewed patient's old medical records in [place]  Chief Complaint: SOB, Cough, vomiting   HPI: Sharon Berg is a 53 y.o. female with medical history significant for active lung cancer on chemo diagnosed Dec 2019, seizures, anxiety, who presented to the ED with complaints of shortness of breath since yesterday and worsening productive cough of 4 days duration.  Cough is productive of dark brown foul-smelling sputum also 4 days.  Patient also reports multiple episodes of vomiting over the past 3 days, with no p.o. intake.  No loose stools.  No abdominal pain. Patient presented to the cancer center today-was tachycardic- 137, with blood pressure systolic 81/19, O2 sats 7% on room air.  Patient was sent to the ED for evaluation.  Recent hospital admission -1/4- 1/6-for sepsis secondary to aspiration/postobstructive pneumonia.  CTA chest was negative for PE but showed progressive cavitation of right perihilar to more though stable size.  Distant extensive tumor/confluent adenopathy invading the right hilum and mediastinum.  ED Course: Tachycardic to 123, tachypneic 34, blood pressure systolic 14/78.  Lactic acid 2.3.  WBC 6.1.  Two-view chest x-ray-multilobar right lung pneumonia, extensive in the upper lobe.  EKG sinus tachycardia.  Patient was started on broad-spectrum antibiotics-vancomycin and cefepime hospitalist to admit for HCAP.  Review of Systems: As per HPI all other systems reviewed and negative.  Past Medical History:  Diagnosis Date  . Anxiety   . Depression   . Lung cancer ([place])   . Migraine   . Pneumonia   . Seizures ([place])    stressed induced    Past Surgical History:  Procedure Laterality Date  . ABDOMINAL HYSTERECTOMY  2000  . PORTACATH PLACEMENT Left 03/14/2018   Procedure: INSERTION  PORT-A-CATH;  Surgeon: Aviva Signs, MD;  Location: [facility];  Service: General;  Laterality: Left;  Marland Kitchen VIDEO BRONCHOSCOPY WITH ENDOBRONCHIAL ULTRASOUND N/A 03/21/2018   Procedure: VIDEO BRONCHOSCOPY WITH ENDOBRONCHIAL ULTRASOUND;  Surgeon: Melrose Nakayama, MD;  Location: [facility];  Service: Thoracic;  Laterality: N/A;     reports that she has quit smoking. Her smoking use included cigarettes. She has a 4.56 pack-year smoking history. She has never used smokeless tobacco. She reports that she does not drink alcohol or use drugs.  Allergies  Allergen Reactions  . Levaquin [Levofloxacin] Other (See Comments)    Myalgia  . Codeine Nausea And Vomiting    Family History  Problem Relation Age of Onset  . Hypertension Mother   . Diabetes Father   . Heart disease Maternal Grandmother   . Stroke Maternal Grandmother   . Heart disease Maternal Grandfather   . Dementia Paternal Grandmother   . Dementia Paternal Grandfather     Prior to Admission medications   Medication Sig Start Date End Date Taking? Authorizing Provider  albuterol (PROVENTIL HFA;VENTOLIN HFA) 108 (90 Base) MCG/ACT inhaler Inhale 2 puffs into the lungs every 6 (six) hours as needed for wheezing or shortness of breath. 05/18/18  Yes Lockamy, Randi L, NP-C  ALPRAZolam (XANAX) 1 MG tablet Take 1 tablet (1 mg total) by mouth 2 (two) times daily. TAKE 1/2 TO 1 TABLET TWICE A DAY FOR ANXIETY 05/18/18  Yes Lockamy, Randi L, NP-C  CARBOPLATIN IV Inject into the vein once a week.   Yes [provider]  fluconazole (DIFLUCAN) 100 MG tablet  Take 100 mg by mouth daily.  05/16/18  Yes [provider]  HYDROcodone-acetaminophen (NORCO/VICODIN) 5-325 MG tablet Take 1 tablet by mouth every 4 (four) hours as needed for moderate pain. 05/18/18  Yes Lockamy, Randi L, NP-C  lidocaine (XYLOCAINE) 2 % solution Use as directed 15 mLs in the mouth or throat every 6 (six) hours as needed for mouth pain (Mixed with Magic Mouthwash).   05/10/18  Yes [provider]  lidocaine-prilocaine (EMLA) cream Apply 1 application topically See admin instructions. Applied to port 1 hour prior to appointment 03/29/18  Yes [provider]  magic mouthwash SOLN Take 5 mLs by mouth 4 (four) times daily as needed for mouth pain.    Yes [provider]  omeprazole (PRILOSEC OTC) 20 MG tablet Take 20 mg by mouth daily.  03/28/18 06/26/18 Yes [provider]  ondansetron (ZOFRAN ODT) 8 MG disintegrating tablet Take 1 tablet (8 mg total) by mouth every 8 (eight) hours as needed for nausea or vomiting (may cause constipation). 05/18/18  Yes Lockamy, Randi L, NP-C  PACLITAXEL IV Inject 78 mg into the vein once a week.   Yes [provider]  potassium chloride SA (K-DUR,KLOR-CON) 20 MEQ tablet Take 1 tablet (20 mEq total) by mouth 2 (two) times daily. 05/05/18  Yes Nat Christen, MD  prochlorperazine (COMPAZINE) 10 MG tablet Take 1 tablet (10 mg total) by mouth every 6 (six) hours as needed (Nausea or vomiting). 04/29/18  Yes Derek [name], MD  scopolamine (TRANSDERM-SCOP) 1 MG/3DAYS Place 1 patch (1.5 mg total) onto the skin every 3 (three) days. 05/05/18  Yes Derek [name], MD  sertraline (ZOLOFT) 100 MG tablet TAKE 1 AND 1/2 TABLETS ONCE DAILY Patient taking differently: Take 150 mg by mouth every morning.  05/02/18  Yes [name], MD  traZODone (DESYREL) 100 MG tablet Take 3 tablets (300 mg total) by mouth at bedtime. 05/02/18  Yes [name], MD  ciprofloxacin (CIPRO) 500 MG tablet Take 1 tablet (500 mg total) by mouth 2 (two) times daily. Patient not taking: Reported on 06/13/2018 05/13/18   Francene Finders L, NP-C  dexamethasone (DECADRON) 4 MG tablet Take 1 tablet (4 mg total) by mouth daily. Patient not taking: Reported on 05/21/2018 05/06/18   Glennie Isle, NP-C    Physical Exam: Vitals:   05/26/2018 1800 06/05/2018 1825 06/09/2018 1830 06/05/2018 1846  BP: (!) 87/56  (!) 87/46   Pulse:  (!) 112  (!) 107 (!) 107  Resp: (!) 21  (!) 34 (!) 29  SpO2: (S) (!) 85% (S) 96% 90% 91%  Weight:      Height:        Constitutional: Acutely and chronically ill-appearing, pale, initially calm but on exam patient became mild to moderately dyspneic Vitals:   06/03/2018 1800 06/11/2018 1825 06/09/2018 1830 06/06/2018 1846  BP: (!) 87/56  (!) 87/46   Pulse: (!) 112  (!) 107 (!) 107  Resp: (!) 21  (!) 34 (!) 29  SpO2: (S) (!) 85% (S) 96% 90% 91%  Weight:      Height:       Eyes: PERRL, lids and conjunctivae normal ENMT: Mucous membranes are dry Posterior pharynx clear of any exudate or lesions.  Neck: normal, supple, no masses, no thyromegaly Respiratory: Transmitted upper airway sounds, congested and wet on auscultation, mild- moderate respiratory distress Cardiovascular: Tachycardic regular rate and rhythm, no murmurs / rubs / gallops. No extremity edema. 2+ pedal pulses. No carotid bruits.  Abdomen: no tenderness, no masses palpated. No hepatosplenomegaly. Bowel sounds positive.  Musculoskeletal: no clubbing / cyanosis. No joint deformity upper and lower extremities. Good ROM, no contractures. Normal muscle tone.  Skin: no rashes, lesions, ulcers. No induration Neurologic: CN 2-12 grossly intact. Strength 5/5 in all 4.  Psychiatric: Normal judgment and insight. Alert and oriented x 3. Normal mood.   Labs on Admission: I have personally reviewed following labs and imaging studies  CBC: Recent Labs  Lab 05/17/18 1033 05/29/2018 1728  WBC 7.0 6.1  NEUTROABS 5.9 4.8  HGB 12.9 11.0*  HCT 39.1 34.3*  MCV 91.6 93.0  PLT 145* 938*   Basic Metabolic Panel: Recent Labs  Lab 05/17/18 1033 05/29/2018 1728  NA 137 135  K 3.1* 3.3*  CL 101 103  CO2 26 22  GLUCOSE 123* 56*  BUN 19 31*  CREATININE 0.41* 0.59  CALCIUM 8.8* 7.6*   Liver Function Tests: Recent Labs  Lab 05/17/18 1033 05/25/2018 1728  AST 32 50*  ALT 37 39  ALKPHOS 66 63  BILITOT 0.8 0.8  PROT 5.8* 4.7*  ALBUMIN 2.4*  1.9*   Coagulation Profile: Recent Labs  Lab 05/22/2018 1728  INR 1.15    Radiological Exams on Admission: Dg Chest 2 View  Result Date: 06/17/2018 CLINICAL DATA:  53 year old female with sepsis. Treated right lung cancer. EXAM: CHEST - 2 VIEW COMPARISON:  Chest CT 04/23/2018 and earlier. FINDINGS: Upright AP and lateral views. New confluent abnormal mixed airspace and interstitial opacity throughout the right upper lobe, relatively sparing the parenchyma nearest the right hilum. Patchy similar opacity at the right lung base. No superimposed pneumothorax or pleural effusion. The left lung appears stable and clear. Mediastinal contours are stable. Visualized tracheal air column is within normal limits. Left chest power port remains in place and is accessed. Osteopenia. No acute osseous abnormality identified. Negative visible bowel gas pattern. IMPRESSION: New abnormal opacity most suggestive of Multilobar Right Lung Pneumonia, extensive in the upper lobe. No pleural effusion. Electronically Signed   By: Genevie Ann M.D.   On: 05/25/2018 17:50    EKG: Independently reviewed.  Sinus tachycardia.  EKG unchanged from prior.  Assessment/Plan Active Problems:   HCAP (healthcare-associated pneumonia)   HCAP-immunocompromised on chemo. SOB, worsening productive cough- dark,, foul-smelling.  No blood.  Chest x-ray shows multilobar right lung pneumonia extensive in the upper lobe.  Again concern for postobstructive pneumonia.  WBC- 6.1. Tachycardic, tachypneic, hypoxic, hypotensive.  Meeting sepsis criteria, with lactic acidosis of 2.3.  IV Vanco and cefepime started in ED. 1.72 L given in ED. Vomiting, poor p.o. intake likely contributing to hypotension.. -Continue IV Vanco, switch cefepime to Zosyn with cavitation in lungs recent CTA, foul-smelling sputum, history of necrotic adenopathy. -Held off on additional imaging at this time, with recent chest CT -Mucolytics, duo nebs -Follow-up blood cultures -  ABG - D5, N/s + 40 KCL 125cc/hr X 15 hrs -Trend lactic acid  Acute respiratory failure-likely secondary to HCAP.  Not on home O2. ABG shows respiratory acidosis pH 7.28, PCO2 50, PO2 66% on nonrebreather.  Bicarb 22.  -BiPAP ordered -Respiratory protocol  Right lung adenocarcinoma-stage IIIb. Seen on CT 02/2018.  Currently on chemotherapy, completed 6th course.  Does not appear to be tolerating chemo well.  Vomiting poor p.o. intake. -Oncology consult, order placed  Active nausea and vomiting-likely chemotherapy induced.  With poor p.o. intake.  Likely contributing to hypotension, tachycardia.  Creatinine stable.  -Hydrate -Zofran PRN -Bowel rest with clear liquid  diet, advance as tolerated  Chronic right chest wall pain-unchanged.  Worked up recent hospitalization with negative troponins and subsequent CTA that showed imaging versus findings as listed above. -Continue home narcotics  Depression/anxiety -Continue home dose alprazolam and Zoloft  Seizure-like activity -Patient has history ofPNES  DVT prophylaxis: Lovenox Code Status: Full Family Communication: Spouse at bedside Disposition Plan: Per rounding team Consults called: Oncology Admission status: Inpt, Stepdown   Bethena Roys MD [facility]  05/26/2018, 7:50 PM

## 2018-05-23 NOTE — Progress Notes (Signed)
Pharmacy Antibiotic Note  Sharon Berg is a 53 y.o. female admitted on 05/22/2018 with pneumonia.  Pharmacy has been consulted for Vancomycin and Cefepime dosing.  Plan: Vancomycin 1000 mg IV x 1 dose. Vancomycin 500 mg IV every 12 hours.  Goal trough 15-20 mcg/mL.  Cefepime 2000 mg IV every 8 hours. Monitor labs, c/s, and vanco trough as indicated.  Height: 5\' 5"  (165.1 cm) Weight: 118 lb (53.5 kg) IBW/kg (Calculated) : 57  Temp (24hrs), Avg:94.1 F (34.5 C), Min:91 F (32.8 C), Max:97.1 F (36.2 C)  Recent Labs  Lab 05/17/18 1033 06/01/2018 1728  WBC 7.0 6.1  CREATININE 0.41* 0.59  LATICACIDVEN  --  2.3*    Estimated Creatinine Clearance: 67.9 mL/min (by C-G formula based on SCr of 0.59 mg/dL).    Allergies  Allergen Reactions  . Levaquin [Levofloxacin] Other (See Comments)    Myalgia  . Codeine Nausea And Vomiting    Antimicrobials this admission: Vanco 2/3 >>  Cefepime 2/3 >>   Dose adjustments this admission: N/A  Microbiology results: 2/3 BCx: pending   Thank you for allowing pharmacy to be a part of this patient's care.  Ramond Craver 05/22/2018 7:03 PM

## 2018-05-23 NOTE — ED Notes (Signed)
Per MD Emokpae place pt on 15L non rebreather at this time.

## 2018-05-23 NOTE — ED Triage Notes (Signed)
Pt from [facility], being tx for lung CA, last tx was 3 weeks ago. Received 1L with K+ and Mag, dextrose, and zofran. Has had increased weakness. While in tx center pt is 77% on RA after a breathing tx. In ED pt is 88% on 3L.

## 2018-05-23 NOTE — ED Provider Notes (Signed)
[facility] EMERGENCY DEPARTMENT Provider Note   CSN: [id number] Arrival date & time: 06/10/2018  1718     History   Chief Complaint Chief Complaint  Patient presents with  . Shortness of Breath    HPI Sharon Berg is a 53 y.o. female.  HPI  53 year old female, history of lung cancer diagnosed within the last 6 months and started on chemotherapy and radiation in December 2019, she is currently taking carboplatin and paclitaxel along with radiation, treatment started April 07, 2018.  She has since that time developed complications including persistent nausea and vomiting, she was admitted with postobstructive pneumonia with fever and hypotension approximately 1 month ago, she finished week 5 of chemotherapy as of January 20.  Over the last couple of weeks she has had almost persistent nausea and vomiting and has had multiple visits to the cancer center for IV fluid hydration.  She presents from the cancer center today because of significant tachycardia, hypotension, respiratory distress and ongoing severe nausea.  She had received IV fluids prior to this transfer including some supplemental potassium.  The patient reports ongoing nausea and vomiting, denies abdominal pain, she does have some chest discomfort especially on the right (has been found to have some necrotic adenopathy of the mediastinum).  She denies any swelling of the legs, denies objective fevers, endorses having some coughing of some brown and red-colored sputum.  Symptoms are persistent, gradually worsening and have now become severe.  Past Medical History:  Diagnosis Date  . Anxiety   . Depression   . Lung cancer ([place])   . Migraine   . Pneumonia   . Seizures ([place])    stressed induced    Patient Active Problem List   Diagnosis Date Noted  . Acute lower UTI   . Sepsis due to undetermined organism ([place]) 04/23/2018  . Non-small cell carcinoma of lung, right ([place]) 04/23/2018  . Intractable nausea and vomiting  04/23/2018  . Vomiting and diarrhea   . Primary lung cancer, right ([place])   . Cancer of right lung ([place]) 02/28/2018  . Seasonal affective disorder ([place]) 04/01/2017  . Seizure-like activity ([place]) 08/10/2016  . Impaired fasting glucose 11/11/2015  . Tension headache 12/18/2013  . 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 Anxiety and depression 07/30/2013    Past Surgical History:  Procedure Laterality Date  . ABDOMINAL HYSTERECTOMY  2000  . PORTACATH PLACEMENT Left 03/14/2018   Procedure: INSERTION PORT-A-CATH;  Surgeon: Aviva Signs, MD;  Location: [facility];  Service: General;  Laterality: Left;  Marland Kitchen VIDEO BRONCHOSCOPY WITH ENDOBRONCHIAL ULTRASOUND N/A 03/21/2018   Procedure: VIDEO BRONCHOSCOPY WITH ENDOBRONCHIAL ULTRASOUND;  Surgeon: Melrose Nakayama, MD;  Location: [facility];  Service: Thoracic;  Laterality: N/A;     OB History   No obstetric history on file.      Home Medications    Prior to Admission medications   Medication Sig Start Date End Date Taking? Authorizing Provider  albuterol (PROVENTIL HFA;VENTOLIN HFA) 108 (90 Base) MCG/ACT inhaler Inhale 2 puffs into the lungs every 6 (six) hours as needed for wheezing or shortness of breath. 05/18/18   Lockamy, Randi L, NP-C  ALPRAZolam (XANAX) 1 MG tablet Take 1 tablet (1 mg total) by mouth 2 (two) times daily. TAKE 1/2 TO 1 TABLET TWICE A DAY FOR ANXIETY 05/18/18   Lockamy, Randi L, NP-C  CARBOPLATIN IV Inject into the vein once a week.    [provider]  ciprofloxacin (CIPRO) 500 MG tablet Take 1 tablet (  500 mg total) by mouth 2 (two) times daily. 05/13/18   Lockamy, Randi L, NP-C  dexamethasone (DECADRON) 4 MG tablet Take 1 tablet (4 mg total) by mouth daily. 05/06/18   Glennie Isle, NP-C  fluconazole (DIFLUCAN) 100 MG tablet  05/16/18   [provider]  HYDROcodone-acetaminophen (NORCO/VICODIN) 5-325 MG tablet Take 1 tablet by mouth every 4 (four) hours as needed for moderate pain. 05/18/18   Glennie Isle, NP-C  lidocaine (XYLOCAINE) 2 % solution  05/10/18   [provider]  lidocaine-prilocaine (EMLA) cream Apply small amount to port site one hour prior to appointment. Cover with plastic wrap. 03/29/18   [provider]  magic mouthwash SOLN Take 5 mLs by mouth.    [provider]  omeprazole (PRILOSEC OTC) 20 MG tablet Take 20 mg by mouth daily.  03/28/18 06/26/18  [provider]  ondansetron (ZOFRAN ODT) 8 MG disintegrating tablet Take 1 tablet (8 mg total) by mouth every 8 (eight) hours as needed for nausea or vomiting (may cause constipation). 05/18/18   Lockamy, Randi L, NP-C  PACLITAXEL IV Inject 78 mg into the vein once a week.    [provider]  potassium chloride SA (K-DUR,KLOR-CON) 20 MEQ tablet Take 1 tablet (20 mEq total) by mouth 2 (two) times daily. 05/05/18   Nat Christen, MD  prochlorperazine (COMPAZINE) 10 MG tablet Take 1 tablet (10 mg total) by mouth every 6 (six) hours as needed (Nausea or vomiting). 04/29/18   Derek [name], MD  scopolamine (TRANSDERM-SCOP) 1 MG/3DAYS Place 1 patch (1.5 mg total) onto the skin every 3 (three) days. 05/05/18   Derek [name], MD  sertraline (ZOLOFT) 100 MG tablet TAKE 1 AND 1/2 TABLETS ONCE DAILY 05/02/18   [name], MD  traZODone (DESYREL) 100 MG tablet Take 3 tablets (300 mg total) by mouth at bedtime. 05/02/18   [name], MD    Family History Family History  Problem Relation Age of Onset  . Hypertension Mother   . Diabetes Father   . Heart disease Maternal Grandmother   . Stroke Maternal Grandmother   . Heart disease Maternal Grandfather   . Dementia Paternal Grandmother   . Dementia Paternal Grandfather     Social History Social History   Tobacco Use  . Smoking status: Former Smoker    Packs/day: 0.12    Years: 38.00    Pack years: 4.56    Types: Cigarettes  . Smokeless tobacco: Never Used  . Tobacco comment: .2 cigs  a day   Substance Use Topics  .  Alcohol use: No    Alcohol/week: 0.0 standard drinks  . Drug use: No     Allergies   Levaquin [levofloxacin] and Codeine   Review of Systems Review of Systems  All other systems reviewed and are negative.    Physical Exam Updated Vital Signs Ht 1.651 m (5\' 5" )   Wt 53.5 kg   BMI 19.64 kg/m   Physical Exam Vitals signs and nursing note reviewed.  Constitutional:      General: She is in acute distress.     Appearance: She is ill-appearing.  HENT:     Head: Normocephalic and atraumatic.     Comments: Oropharynx is dry mucous membranes are dry    Mouth/Throat:     Pharynx: No oropharyngeal exudate.  Eyes:     General: No scleral icterus.       Right eye: No discharge.  Left eye: No discharge.     Conjunctiva/sclera: Conjunctivae normal.     Pupils: Pupils are equal, round, and reactive to light.  Neck:     Musculoskeletal: Normal range of motion and neck supple.     Thyroid: No thyromegaly.     Vascular: No JVD.  Cardiovascular:     Rate and Rhythm: Regular rhythm. Tachycardia present.     Heart sounds: Normal heart sounds. No murmur. No friction rub. No gallop.   Pulmonary:     Effort: Tachypnea and respiratory distress present.     Breath sounds: Rales present. No wheezing.     Comments: Subtle rales intermittently heard, no wheezing Chest:     Chest wall: No tenderness.  Abdominal:     General: Bowel sounds are normal. There is no distension.     Palpations: Abdomen is soft. There is no mass.     Tenderness: There is no abdominal tenderness.  Musculoskeletal: Normal range of motion.        General: No tenderness.  Lymphadenopathy:     Cervical: No cervical adenopathy.  Skin:    General: Skin is warm and dry.     Findings: No erythema or rash.  Neurological:     Mental Status: She is alert.     Coordination: Coordination normal.     Comments: Speech is clear, coordination is normal, the patient is diffusely generally weak  Psychiatric:         Behavior: Behavior normal.      ED Treatments / Results  Labs (all labs ordered are listed, but only abnormal results are displayed) Labs Reviewed  CULTURE, BLOOD (ROUTINE X 2)  CULTURE, BLOOD (ROUTINE X 2)  COMPREHENSIVE METABOLIC PANEL  LACTIC ACID, PLASMA  LACTIC ACID, PLASMA  CBC WITH DIFFERENTIAL/PLATELET  PROTIME-INR  URINALYSIS, ROUTINE W REFLEX MICROSCOPIC    EKG None  Radiology No results found.  Procedures .Critical Care Performed by: Noemi Chapel, MD Authorized by: Noemi Chapel, MD   Critical care provider statement:    Critical care time (minutes):  35   Critical care time was exclusive of:  Separately billable procedures and treating other patients and teaching time   Critical care was necessary to treat or prevent imminent or life-threatening deterioration of the following conditions:  Respiratory failure   Critical care was time spent personally by me on the following activities:  Blood draw for specimens, development of treatment plan with patient or surrogate, discussions with consultants, evaluation of patient's response to treatment, examination of patient, obtaining history from patient or surrogate, ordering and performing treatments and interventions, ordering and review of laboratory studies, ordering and review of radiographic studies, pulse oximetry, re-evaluation of patient's condition and review of old charts   (including critical care time)  Medications Ordered in ED Medications  lactated ringers bolus 1,000 mL (1,000 mLs Intravenous New Bag/Given 06/08/2018 1816)    And  lactated ringers bolus 500 mL (500 mLs Intravenous New Bag/Given 05/22/2018 1822)    And  lactated ringers bolus 250 mL (250 mLs Intravenous New Bag/Given 05/26/2018 1823)  vancomycin (VANCOCIN) IVPB 1000 mg/200 mL premix (1,000 mg Intravenous New Bag/Given 05/22/2018 1817)  ceFEPIme (MAXIPIME) 2 g in sodium chloride 0.9 % 100 mL IVPB (2 g Intravenous New Bag/Given 06/13/2018 1756)      Initial Impression / Assessment and Plan / ED Course  I have reviewed the triage vital signs and the nursing notes.  Pertinent labs & imaging results that were available during my care  of the patient were reviewed by me and considered in my medical decision making (see chart for details).  Clinical Course as of May 24 1823  Mon May 23, 2018  1807 I have personally viewed the chest x-ray, 2 view PA and lateral, my interpretation is that this x-ray shows multilobar right-sided pneumonia.  This is significant and severe, the patient will need to be admitted to the hospital.  Antibiotics have been ordered as well as 30 cc/kg of IV fluids   [BM]    Clinical Course User Index [BM] Noemi Chapel, MD    Due to the patient's condition she was sent to the emergency department for further work-up including "a stat scan", will check renal function prior to placing the patient under a contrast load although I would endorse a work-up for both sepsis as well as pneumothorax, pulmonary embolism etc.  Chest x-ray and labs pending, IV fluid bolus has been ordered at 30 cc/kg and broad-spectrum antibiotics due to the high likelihood that this is a worsening pneumonia.  After discussion with the patient's family and review of prior medical records it appears that the patient's blood pressure usually runs very low below 100, her blood pressure here is between 85 and 95 systolic, her lactic acid is 2.3, she does not have a leukocytosis but the fact that she is on chemotherapy and her relative immunosuppression yields significant concern.  I will treat this as if it is severe sepsis, I do not think she is truly in shock but will repeat the lactic acid to be sure.  I discussed the case with the hospitalist at 6:20 PM, she has been kind enough to admit.  With supplemental oxygen the patient is over 90%  Vitals:   06/10/2018 1714 05/29/2018 1800  BP:  (!) 87/56  Pulse:  (!) 112  Resp:  (!) 21  SpO2:  (!) 85%   Weight: 53.5 kg   Height: 1.651 m (5\' 5" )      Final Clinical Impressions(s) / ED Diagnoses   Final diagnoses:  Sepsis with acute hypoxic respiratory failure without septic shock, due to unspecified organism [facility])     Noemi Chapel, MD 06/17/2018 [phone]

## 2018-05-23 NOTE — ED Notes (Signed)
Pt now on bi-pap.

## 2018-05-23 NOTE — Patient Instructions (Signed)
[facility]  Discharge Instructions:   _______________________________________________________________  Thank you for choosing [facility] at [facility] to provide your oncology and hematology care.  To afford each patient quality time with our providers, please arrive at least 15 minutes before your scheduled appointment.  You need to re-schedule your appointment if you arrive 10 or more minutes late.  We strive to give you quality time with our providers, and arriving late affects you and other patients whose appointments are after yours.  Also, if you no show three or more times for appointments you may be dismissed from the clinic.  Again, thank you for choosing  Cancer Center at [facility]. Our hope is that these requests will allow you access to exceptional care and in a timely manner. _______________________________________________________________  If you have questions after your visit, please contact our office at [phone] between the hours of 8:30 a.m. and 5:00 p.m. Voicemails left after 4:30 p.m. will not be returned until the following business day. _______________________________________________________________  For prescription refill requests, have your pharmacy contact our office. _______________________________________________________________  Recommendations made by the consultant and any test results will be sent to your referring physician. _______________________________________________________________ 

## 2018-05-23 NOTE — Progress Notes (Signed)
Spoke with Dr. Delton Coombes pertaining to pt status. Reported VS, HR of 137, pt complaints of vomiting x 3 days. BP 80/30, temp 97.1, resp. 16. VO received at this time to infuse IV fluids Mag 2 grams / K+ 20 meq. No labs to be drawn at this time per MD.   Requested Dr. Delton Coombes to come to the bedside. Discharge VS sats 77% on room air. 2 L of 02 placed via [place] and sat increased to 90%. Pt in no distress at this time. Pt seen and assessed by RLockamy NP.   Dr. Delton Coombes at the bedside. Plan of care discussed with the patient. Per Dr. Tomie China VO. Send pt the ED for a stat scan.   Called report to Leslie/ Charge nurse in the ED. Pt will go to room 6. Pt brought to the ED via wheel chair by [facility] RN/ [facility] RN.

## 2018-05-24 ENCOUNTER — Other Ambulatory Visit: Payer: Self-pay

## 2018-05-24 ENCOUNTER — Other Ambulatory Visit (HOSPITAL_COMMUNITY): Payer: 59

## 2018-05-24 ENCOUNTER — Inpatient Hospital Stay (HOSPITAL_COMMUNITY): Payer: 59

## 2018-05-24 ENCOUNTER — Ambulatory Visit (HOSPITAL_COMMUNITY): Payer: 59

## 2018-05-24 ENCOUNTER — Encounter (HOSPITAL_COMMUNITY): Payer: Self-pay

## 2018-05-24 DIAGNOSIS — E43 Unspecified severe protein-calorie malnutrition: Secondary | ICD-10-CM

## 2018-05-24 DIAGNOSIS — R6520 Severe sepsis without septic shock: Secondary | ICD-10-CM

## 2018-05-24 DIAGNOSIS — Z7189 Other specified counseling: Secondary | ICD-10-CM

## 2018-05-24 DIAGNOSIS — J9601 Acute respiratory failure with hypoxia: Secondary | ICD-10-CM

## 2018-05-24 DIAGNOSIS — A419 Sepsis, unspecified organism: Principal | ICD-10-CM

## 2018-05-24 DIAGNOSIS — C3431 Malignant neoplasm of lower lobe, right bronchus or lung: Secondary | ICD-10-CM

## 2018-05-24 DIAGNOSIS — Z515 Encounter for palliative care: Secondary | ICD-10-CM

## 2018-05-24 DIAGNOSIS — R112 Nausea with vomiting, unspecified: Secondary | ICD-10-CM

## 2018-05-24 LAB — LACTIC ACID, PLASMA
Lactic Acid, Venous: 2.3 mmol/L (ref 0.5–1.9)
Lactic Acid, Venous: 2.6 mmol/L (ref 0.5–1.9)
Lactic Acid, Venous: 2.7 mmol/L (ref 0.5–1.9)
Lactic Acid, Venous: 2.1 mmol/L (ref 0.5–1.9)
Lactic Acid, Venous: 2.1 mmol/L (ref 0.5–1.9)

## 2018-05-24 LAB — CBC
HCT: 30.2 % — ABNORMAL LOW (ref 36.0–46.0)
HEMOGLOBIN: 9.7 g/dL — AB (ref 12.0–15.0)
MCH: 29.3 pg (ref 26.0–34.0)
MCHC: 32.1 g/dL (ref 30.0–36.0)
MCV: 91.2 fL (ref 80.0–100.0)
Platelets: 92 10*3/uL — ABNORMAL LOW (ref 150–400)
RBC: 3.31 MIL/uL — ABNORMAL LOW (ref 3.87–5.11)
RDW: 16.8 % — ABNORMAL HIGH (ref 11.5–15.5)
WBC: 3.4 10*3/uL — ABNORMAL LOW (ref 4.0–10.5)
nRBC: 0 % (ref 0.0–0.2)

## 2018-05-24 LAB — BASIC METABOLIC PANEL
Anion gap: 7 (ref 5–15)
BUN: 22 mg/dL — ABNORMAL HIGH (ref 6–20)
CO2: 22 mmol/L (ref 22–32)
Calcium: 7.6 mg/dL — ABNORMAL LOW (ref 8.9–10.3)
Chloride: 109 mmol/L (ref 98–111)
Creatinine, Ser: 0.37 mg/dL — ABNORMAL LOW (ref 0.44–1.00)
GFR calc Af Amer: >60 mL/min (ref >60)
GLUCOSE: 162 mg/dL — AB (ref 70–99)
Potassium: 3.5 mmol/L (ref 3.5–5.1)
Sodium: 138 mmol/L (ref 135–145)

## 2018-05-24 LAB — PROCALCITONIN: Procalcitonin: 4.03 ng/mL

## 2018-05-24 LAB — MRSA PCR SCREENING: MRSA by PCR: NEGATIVE

## 2018-05-24 MED ORDER — NOREPINEPHRINE 4 MG/250ML-% IV SOLN
0.0000 ug/min | INTRAVENOUS | Status: DC
  Administered 2018-05-24 – 2018-05-26 (×8): 10 ug/min via INTRAVENOUS
  Filled 2018-05-24 (×6): qty 250

## 2018-05-24 MED ORDER — SODIUM CHLORIDE 0.9 % IV BOLUS (SEPSIS)
250.0000 mL | Freq: Once | INTRAVENOUS | Status: AC
  Administered 2018-05-24: 250 mL via INTRAVENOUS

## 2018-05-24 MED ORDER — SODIUM CHLORIDE 0.9 % IV BOLUS
1000.0000 mL | Freq: Once | INTRAVENOUS | Status: AC
  Administered 2018-05-24: 1000 mL via INTRAVENOUS

## 2018-05-24 MED ORDER — MORPHINE SULFATE (PF) 2 MG/ML IV SOLN
2.0000 mg | INTRAVENOUS | Status: DC | PRN
  Administered 2018-05-24 – 2018-05-26 (×8): 2 mg via INTRAVENOUS
  Filled 2018-05-24 (×8): qty 1

## 2018-05-24 MED ORDER — IPRATROPIUM BROMIDE 0.02 % IN SOLN
0.5000 mg | Freq: Four times a day (QID) | RESPIRATORY_TRACT | Status: DC
  Administered 2018-05-24 – 2018-05-29 (×19): 0.5 mg via RESPIRATORY_TRACT
  Filled 2018-05-24 (×18): qty 2.5

## 2018-05-24 MED ORDER — SODIUM CHLORIDE 0.9 % IV BOLUS (SEPSIS)
500.0000 mL | Freq: Once | INTRAVENOUS | Status: AC
  Administered 2018-05-24: 500 mL via INTRAVENOUS

## 2018-05-24 MED ORDER — BOOST / RESOURCE BREEZE PO LIQD CUSTOM
1.0000 | Freq: Three times a day (TID) | ORAL | Status: DC
  Administered 2018-05-24 (×2): 1 via ORAL

## 2018-05-24 MED ORDER — SODIUM CHLORIDE 0.9 % IV BOLUS (SEPSIS)
1000.0000 mL | Freq: Once | INTRAVENOUS | Status: AC
  Administered 2018-05-24: 1000 mL via INTRAVENOUS

## 2018-05-24 MED ORDER — LACTATED RINGERS IV BOLUS
1000.0000 mL | Freq: Once | INTRAVENOUS | Status: AC
  Administered 2018-05-24: 1000 mL via INTRAVENOUS

## 2018-05-24 MED ORDER — CHLORHEXIDINE GLUCONATE 0.12 % MT SOLN
15.0000 mL | Freq: Two times a day (BID) | OROMUCOSAL | Status: DC
  Administered 2018-05-24 – 2018-05-26 (×4): 15 mL via OROMUCOSAL
  Filled 2018-05-24 (×5): qty 15

## 2018-05-24 MED ORDER — ORAL CARE MOUTH RINSE
15.0000 mL | Freq: Two times a day (BID) | OROMUCOSAL | Status: DC
  Administered 2018-05-24 – 2018-05-25 (×2): 15 mL via OROMUCOSAL

## 2018-05-24 MED ORDER — VANCOMYCIN HCL 500 MG IV SOLR
INTRAVENOUS | Status: AC
  Filled 2018-05-24: qty 500

## 2018-05-24 MED ORDER — LEVALBUTEROL HCL 0.63 MG/3ML IN NEBU
0.6300 mg | INHALATION_SOLUTION | Freq: Four times a day (QID) | RESPIRATORY_TRACT | Status: DC
  Administered 2018-05-24 – 2018-05-29 (×19): 0.63 mg via RESPIRATORY_TRACT
  Filled 2018-05-24 (×19): qty 3

## 2018-05-24 NOTE — Progress Notes (Signed)
05/24/2018 6:12 PM  Pt hypotensive with severe sepsis, starting norepinephrine infusion and ordering PICC line.  Continue sepsis bundle IV fluids.  Murvin Natal MD

## 2018-05-24 NOTE — Progress Notes (Signed)
Removed bipap from patient at this time and placed on 11lpm HFNC. Pt is stable and comfortable at this time.

## 2018-05-24 NOTE — Consult Note (Addendum)
Consultation Note Date: 05/24/2018   Patient Name: Sharon Berg  DOB: April 08, 1966  MRN: [id number]  Age / Sex: 53 y.o., female  PCP: [name], MD Referring Physician: Murlean Iba, MD  Reason for Consultation: Establishing goals of care  HPI/Patient Profile: 53 y.o. female  with past medical history of stage IIIb adenocarcinoma lung cancer diagnosed Dec 2019 (currently taking chemo/radiation), seizures, migraines, anxiety/depression, recent admission with post-obstructive pneumonia admitted on 05/29/2018 with SOB, cough, vomiting and being treated for multilobar pneumonia and respiratory failure requiring BiPAP. Also with poor tolerance of chemotherapy and extremely frail and deconditioned.   Clinical Assessment and Goals of Care: I met today at Sharon Berg's bedside along with her husband and daughter. Sharon Berg is requesting chemo and mainly concerned about when she gets her next treatment. They tell me that she has 1 more chemo and 3 more radiation sessions planned and that she has been looking forward to "ringing the bell." Per oncology notes there are plans for 4 week break after completing chemo/radiation then restaging scans and after 6 weeks potentially beginning immunotherapy. Sharon Berg is severely deconditioned and she talks of wanting to get stronger. I encourage her to focus on resting and trying to improve nutrition although reiterating that we understand that she is doing the best she is able.   Sharon Berg has struggled through treatment with nausea/vomiting, extremely poor appetite, fatigue, weakness, falls. They are all very clear that they believe the treatments are working and that she will improve. Daughter is concerned about PE and I will update attending regarding this concern. Family is trying to reassure Sharon Berg to focus on resting, eating/drinking, and treating pneumonia before continuing treatment for  cancer. I did explain how the cancer is contributing to these pneumonia infections.   Primary Decision Maker PATIENT    SUMMARY OF RECOMMENDATIONS   - Continue full aggressive care - [place]ful for improvement to complete 1 more chemo and 3 more radiation treatments  Code Status/Advance Care Planning:  Full code   Symptom Management:   Severe malnutrition with cachexia/albumin 1.9: Encourage supplements. Recommend nutritional consult. Offered appetite stimulant but family declined at this time. Consider mirtazapine 7.5 mg qhs (or decadron trial). May also require artificial nutrition if they continue to desire aggressive care. She struggles with strength to even obtaining fluids via straw.   Palliative Prophylaxis:   Aspiration, Frequent Pain Assessment, Oral Care and Turn Reposition  Additional Recommendations (Limitations, Scope, Preferences):  Full Scope Treatment  Psycho-social/Spiritual:   Desire for further Chaplaincy support:yes  Additional Recommendations: Caregiving  Support/Resources  Prognosis:   Prognosis poor with severe acute illness combined with severe frailty and deconditioning.   Discharge Planning: To Be Determined      Primary Diagnoses: Present on Admission: . HCAP (healthcare-associated pneumonia) . Anxiety and depression . 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 Cancer of right lung ([place]) . Primary lung cancer, right ([place]) . Non-small cell carcinoma of lung, right ([place]) . Intractable nausea and vomiting   I have reviewed the medical record, interviewed the  patient and family, and examined the patient. The following aspects are pertinent.  Past Medical History:  Diagnosis Date  . Anxiety   . Depression   . Lung cancer ([place])   . Migraine   . Pneumonia   . Seizures ([place])    stressed induced   Social History   Socioeconomic History  . Marital status: Married    Spouse name: Sharon Berg  . Number of children: 1  . Years of education: College  .  Highest education level: Not on file  Occupational History  . 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 Financial resource strain: Not hard at all  . Food insecurity:    Worry: Never true    Inability: Never true  . Transportation needs:    Medical: No    Non-medical: No  Tobacco Use  . Smoking status: Former Smoker    Packs/day: 0.12    Years: 38.00    Pack years: 4.56    Types: Cigarettes  . Smokeless tobacco: Never Used  . Tobacco comment: .2 cigs  a day   Substance and Sexual Activity  . Alcohol use: No    Alcohol/week: 0.0 standard drinks  . Drug use: No  . Sexual activity: Yes    Birth control/protection: Surgical  Lifestyle  . Physical activity:    Days per week: 0 days    Minutes per session: 0 min  . Stress: Rather much  Relationships  . Social connections:    Talks on phone: More than three times a week    Gets together: Once a week    Attends religious service: Never    Active member of club or organization: No    Attends meetings of clubs or organizations: Never    Relationship status: Married  Other Topics Concern  . Not on file  Social History Narrative   Patient is married with one child.   Patient is right handed.   Patient has college education.   Caffeine use: Patient drinks 1 glass of tea daily.   Family History  Problem Relation Age of Onset  . Hypertension Mother   . Diabetes Father   . Heart disease Maternal Grandmother   . Stroke Maternal Grandmother   . Heart disease Maternal Grandfather   . Dementia Paternal Grandmother   . Dementia Paternal Grandfather    Scheduled Meds: . chlorhexidine  15 mL Mouth Rinse BID  . enoxaparin (LOVENOX) injection  40 mg Subcutaneous Q24H  . feeding supplement  1 Container Oral TID BM  . fluconazole  100 mg Oral Daily  . ipratropium-albuterol  3 mL Nebulization Q6H  . mouth rinse  15 mL Mouth Rinse q12n4p  . sertraline  150 mg Oral q morning - 10a  .  traZODone  300 mg Oral QHS   Continuous Infusions: . dextrose 5 % and 0.9 % NaCl with KCl 40 mEq/L 150 mL/hr at 05/24/18 0704  . piperacillin-tazobactam (ZOSYN)  IV 3.375 g (05/24/18 0741)  . sodium chloride     And  . sodium chloride    . vancomycin Stopped (05/24/18 8250)   PRN Meds:.ALPRAZolam, HYDROcodone-acetaminophen, ipratropium-albuterol, ondansetron **OR** ondansetron (ZOFRAN) IV, polyethylene glycol Allergies  Allergen Reactions  . Levaquin [Levofloxacin] Other (See Comments)    Myalgia  . Codeine Nausea And Vomiting   Review of Systems  Constitutional: Positive for activity change, appetite change and fatigue.  Respiratory: Positive for cough and shortness of breath.  Neurological: Positive for weakness.  Psychiatric/Behavioral: The patient is nervous/anxious.     Physical Exam Vitals signs and nursing note reviewed.  Constitutional:      Appearance: She is cachectic. She is toxic-appearing.     Comments: Pale  Cardiovascular:     Rate and Rhythm: Tachycardia present.  Pulmonary:     Effort: Respiratory distress present. No tachypnea or accessory muscle usage.  Abdominal:     General: Abdomen is flat.  Neurological:     Mental Status: She is lethargic and confused.     Motor: Weakness present.     Vital Signs: BP (!) 94/48   Pulse (!) 107   Temp (!) 97.5 F (36.4 C) (Oral)   Resp (!) 24   Ht _0  (1.651 m)   Wt 57 kg   SpO2 95%   BMI 20.91 kg/m  Pain Scale: 0-10   Pain Score: 6    SpO2: SpO2: 95 % O2 Device:SpO2: 95 % O2 Flow Rate: .   IO: Intake/output summary:   Intake/Output Summary (Last 24 hours) at 05/24/2018 1237 Last data filed at 05/24/2018 2182 Gross per 24 hour  Intake 4907.2 ml  Output -  Net 4907.2 ml    LBM: Last BM Date: 05/24/18 Baseline Weight: Weight: 53.5 kg Most recent weight: Weight: 57 kg     Palliative Assessment/Data: 30%     Time In: 1100 Time Out: 1215 Time Total: 75 min Greater than 50%  of this time  was spent counseling and coordinating care related to the above assessment and plan.  Signed by: Vinie Sill, NP Palliative Medicine Team Pager # [phone] (M-F 8a-5p) Team Phone # [phone] (Nights/Weekends)

## 2018-05-24 NOTE — Progress Notes (Signed)
CRITICAL VALUE ALERT  Critical Value:  Lactic Acid of 2.6  Date & Time Notied:  05/24/2018 1109  Provider Notified:  Irwin Brakeman, MD   Orders Received/Actions taken: sepsis bundle initiated

## 2018-05-24 NOTE — Progress Notes (Signed)
Initial Nutrition Assessment  DOCUMENTATION CODES:  Severe malnutrition in context of chronic illness  INTERVENTION:  Continue Boost Breeze po TID as respiratory status allows, each supplement provides 250 kcal and 9 grams of protein   RD took list of foods the pt does best with. Will put in place  Handout/suggestions on N/v-though spouse believes much of nausea is mentally-based  NUTRITION DIAGNOSIS:  Severe Malnutrition(Chronic) related to cancer and cancer related treatments, nausea as evidenced by loss of >10% bw in <6 months and an oral intake that has met </= to 75% of needs for >/= to 1 month  GOAL:  Patient will meet greater than or equal to 90% of their needs  MONITOR:  PO intake, Supplement acceptance, I & O's, Diet advancement, Labs, Weight trends  REASON FOR ASSESSMENT:  Malnutrition Screening Tool    ASSESSMENT:  53 y/o female PMHx Anxiety, depression, significant tobacco abuse (>55 pack yr), and active lung cancer (dx Nov 2019) for which she has been receiving chemoradiation. Presented to ED from OP oncology appt d/t hypoxia, tachycardia and hypotension. CXR showed multilobar PNA. Admitted for sepsis 2/2 HCAP.   Spoke w/ the outpatient RD who has been following pt at cancer center. Pt's main issue has been intractable nausea, thought to be consequence of her chemo regimen. She is only able to tolerate small amounts at once and she had been coming in regularly for preemptive IVFs. RD further notes the pt is unable to tolerate high-kcal supplements and has been restricted to the "clear" versions. Family has been adding benecalorie to pts fluids since time of her dx.   On RD arrival today, pt actively vomiting. She is placed back onto BiPAP. NT reports the patient has been consuming the [facility] well, but ate essentially nothing off her CL lunch tray. She did not have any breakfast as she was on BiPAP.  Pt unable to converse. All information obtained from husband at  bedside. He reports pt abruptly declined Saturday and likens it to falling off a cliff. Prior to Saturday, she had ~1 week where she was doing relatively well. He says he got the pt to consume 1700 kcals on Friday. Pt did 180 on Saturday, developing severe SOB/weakness. Pt mainly has been consuming jello, thinned-ice cream, juices and other liquids, all with benecalorie added to them. He says the pts last tx was ~2 weeks ago. She had been having interruptions in her treatment d/t severe nausea and has been regularly receiving IVF for the past 2 weeks or so. He firmly believes much of the patients nausea is mental-whenever pt looks at food, she quickly will become nauseated-no matter what it is-it could be the most appropriate food for nausea and pt will still get sick. Husband is detail oriented and keeps track of number of calories pt eats. He estimates pt ate ~1200 kcals maybe 7 or 8x in past month. It sounds pt met <75% of needs for vast majority of past several months.  Weight wise, chart shows pt weighing 144 lbs at time of cancer dx in November. It appears her weight had been trending down for roughly a year or so prior to this. Was upper 140s low 150s earlier this year. Currently, she is 118 lbs (standing wt at [facility] prior to being sent to ED). This is a loss of 18% bw in ~4 months-meets malnutrition criteria.   RD noted what items the pt does best with. Will add to meals as her diet is advanced. Continue  boost breeze TID. RD provided handout on nausea/vomiting- potentially will offer pt/spouse new ideas on how to manage nausea.  Labs: Lactic:2.6, H/H:9.7/30.2, Glu: 162, Albumin:1.9,  Meds: Boost Breeze TID (consuming), Diflucan, IVF, IV abx  Recent Labs  Lab 06/17/2018 1728 05/24/18 0428  NA 135 138  K 3.3* 3.5  CL 103 109  CO2 22 22  BUN 31* 22*  CREATININE 0.59 0.37*  CALCIUM 7.6* 7.6*  MG 2.4  --   GLUCOSE 56* 162*   NUTRITION - FOCUSED PHYSICAL EXAM: Deferred for now- will do  complete assess when not hindered by BiPAP  Diet Order:   Diet Order            Diet clear liquid Room service appropriate? Yes; Fluid consistency: Thin  Diet effective now             EDUCATION NEEDS:  Education needs have been addressed  Skin:  Skin Assessment: Reviewed RN Assessment  Last BM:  2/4  Height:  Ht Readings from Last 1 Encounters:  05/24/18 '5\' 5"'$  (1.651 m)   Weight:  Wt Readings from Last 1 Encounters:  05/24/18 57 kg   Wt Readings from Last 10 Encounters:  05/24/18 57 kg  05/18/18 54.4 kg  05/17/18 52.5 kg  05/16/18 53.6 kg  05/11/18 54.9 kg  05/06/18 57.2 kg  05/05/18 56.7 kg  04/29/18 58.9 kg  04/23/18 60.2 kg  04/22/18 60.1 kg   Ideal Body Weight:  56.82 kg  BMI:  Body mass index is 20.91 kg/m.  Dry wt: 118 (53.64 kg) Estimated Nutritional Needs:  Kcal:  1800-2000 kcals (34-37 kcal/kg bw) Protein:  80-95g Pro (1.5-1.8g/kg bw) Fluid:  >1.9 L fluid (35 ml/kg bw)  Burtis Junes RD, LDN, CNSC Clinical Nutrition Available Tues-Sat via Pager: [phone] 05/24/2018 3:13 PM

## 2018-05-24 NOTE — Progress Notes (Addendum)
PROGRESS NOTE  Sharon Berg  XTK:[id number]  DOB: 09/01/1965  DOA: 05/24/2018 PCP: [name], MD   Brief Admission Hx: 53 year old female being actively treated for lung cancer since December 2019 was sent to the ED from the cancer center with hypotension, hypoxic, shortness of breath and diagnosed with pneumonia.  MDM/Assessment & Plan:   1. HCAP-patient is immunocompromised and currently receiving chemotherapy however there is concern for postobstructive pneumonia with anaerobic infection given the cavitation in the lung seen on recent CTA.  She was also noted to have a foul-smelling sputum.  Continue Zosyn and vancomycin.  CTA chest to rule out PE.   2. Sepsis secondary to pneumonia- continue IV fluid resuscitation and repeat lactic acid until corrected. 3. Right lung adenocarcinoma-stage IIIb.  This was seen on CT 03/09/2018.  The patient is currently completed her 6 courses of chemotherapy.  She is having a difficult time tolerating her chemotherapy.  She has poor oral intake can vomiting.  She appears very deconditioned.  Oncology consult has been requested.  Requested goals of care consultation. 4. Nausea and vomiting-suspect this is chemotherapy-induced.  She is being treated with IV fluids and supportive therapy.  She is tachycardic and hypotensive which is improving with hydration.  She has been followed in the stepdown unit closely.  Zofran ordered for nausea symptoms.  She is on a clear liquid diet for bowel rest. 5. Acute respiratory failure-secondary to HCAP.  The patient is not on home oxygen.  She is being supported with bipap and I have consulted RT to watch her closely in stepdown unit.  6. Chronic right chest wall pain- she has been worked up for this with recent hospitalizations and has been treated with narcotics. 7. Depression/anxiety- resume home alprazolam and Zoloft.  DVT prophylaxis: Lovenox Code Status: Full Family Communication: spouse Disposition Plan:  stepdown ICU  Consultants:  oncology  Procedures:  n/a  Antimicrobials:  Zosyn 2/3 >  Vancomycin 2/3 >  Cefepime 2/3   Subjective: Patient is having a difficult time tolerating the BiPAP and wants it removed.  She still having shortness of breath.  She is still having chest wall pain.  Objective: Vitals:   05/24/18 0700 05/24/18 0755 05/24/18 0801 05/24/18 0805  BP: (!) 101/53     Pulse: (!) 106   (!) 103  Resp: (!) 24     Temp:  (!) 97.5 F (36.4 C)    TempSrc:  Oral    SpO2: 98%  98% 99%  Weight:      Height:        Intake/Output Summary (Last 24 hours) at 05/24/2018 9924 Last data filed at 05/24/2018 2683 Gross per 24 hour  Intake 4907.2 ml  Output -  Net 4907.2 ml   Filed Weights   06/09/2018 1714 05/24/18 0243 05/24/18 0421  Weight: 53.5 kg 57 kg 57 kg     REVIEW OF SYSTEMS  As per history otherwise all reviewed and reported negative  Exam:  General exam: Chronically ill-appearing, emaciated, pale in mild to moderate distress on BiPAP, poor dentition Respiratory system: Diffuse Rales and crackles in right upper lobe moderate increased work of breathing. Cardiovascular system: S1 & S2 heard. No JVD, murmurs, gallops, clicks or pedal edema. Gastrointestinal system: Abdomen is nondistended, soft and nontender. Normal bowel sounds heard. Central nervous system: Alert and oriented. No focal neurological deficits. Extremities: no CCE.  Data Reviewed: Basic Metabolic Panel: Recent Labs  Lab 05/17/18 1033 05/22/2018 1728 05/24/18 0428  NA 137  135 138  K 3.1* 3.3* 3.5  CL 101 103 109  CO2 26 22 22   GLUCOSE 123* 56* 162*  BUN 19 31* 22*  CREATININE 0.41* 0.59 0.37*  CALCIUM 8.8* 7.6* 7.6*  MG  --  2.4  --    Liver Function Tests: Recent Labs  Lab 05/17/18 1033 06/17/2018 1728  AST 32 50*  ALT 37 39  ALKPHOS 66 63  BILITOT 0.8 0.8  PROT 5.8* 4.7*  ALBUMIN 2.4* 1.9*   No results for input(s): LIPASE, AMYLASE in the last 168 hours. No results  for input(s): AMMONIA in the last 168 hours. CBC: Recent Labs  Lab 05/17/18 1033 06/10/2018 1728 05/24/18 0428  WBC 7.0 6.1 3.4*  NEUTROABS 5.9 4.8  --   HGB 12.9 11.0* 9.7*  HCT 39.1 34.3* 30.2*  MCV 91.6 93.0 91.2  PLT 145* 128* 92*   Cardiac Enzymes: No results for input(s): CKTOTAL, CKMB, CKMBINDEX, TROPONINI in the last 168 hours. CBG (last 3)  Recent Labs    06/08/2018 2004  GLUCAP 76   Recent Results (from the past 240 hour(s))  Culture, blood (Routine x 2)     Status: None (Preliminary result)   Collection Time: 06/05/2018  5:28 PM  Result Value Ref Range Status   Specimen Description BLOOD PORTA CATH DRAWN BY RN  Final   Special Requests   Final    BOTTLES DRAWN AEROBIC AND ANAEROBIC Blood Culture adequate volume   Culture   Final    NO GROWTH < 12 HOURS Performed at [facility], [address], [place], [place] [address]    Report Status PENDING  Incomplete  Culture, blood (Routine x 2)     Status: None (Preliminary result)   Collection Time: 05/28/2018  5:28 PM  Result Value Ref Range Status   Specimen Description BLOOD PORTA CATH DRAWN BY RN  Final   Special Requests   Final    BOTTLES DRAWN AEROBIC ONLY Blood Culture results may not be optimal due to an inadequate volume of blood received in culture bottles   Culture   Final    NO GROWTH < 12 HOURS Performed at [facility], [address], [place], [place] [address]    Report Status PENDING  Incomplete     Studies: Dg Chest 2 View  Result Date: 05/31/2018 CLINICAL DATA:  53 year old female with sepsis. Treated right lung cancer. EXAM: CHEST - 2 VIEW COMPARISON:  Chest CT 04/23/2018 and earlier. FINDINGS: Upright AP and lateral views. New confluent abnormal mixed airspace and interstitial opacity throughout the right upper lobe, relatively sparing the parenchyma nearest the right hilum. Patchy similar opacity at the right lung base. No superimposed pneumothorax or pleural effusion. The left lung appears stable and  clear. Mediastinal contours are stable. Visualized tracheal air column is within normal limits. Left chest power port remains in place and is accessed. Osteopenia. No acute osseous abnormality identified. Negative visible bowel gas pattern. IMPRESSION: New abnormal opacity most suggestive of Multilobar Right Lung Pneumonia, extensive in the upper lobe. No pleural effusion. Electronically Signed   By: Genevie Ann M.D.   On: 05/28/2018 17:50     Scheduled Meds: . chlorhexidine  15 mL Mouth Rinse BID  . enoxaparin (LOVENOX) injection  40 mg Subcutaneous Q24H  . feeding supplement  1 Container Oral TID BM  . fluconazole  100 mg Oral Daily  . ipratropium-albuterol  3 mL Nebulization Q6H  . mouth rinse  15 mL Mouth Rinse q12n4p  .  sertraline  150 mg Oral q morning - 10a  . traZODone  300 mg Oral QHS   Continuous Infusions: . dextrose 5 % and 0.9 % NaCl with KCl 40 mEq/L 150 mL/hr at 05/24/18 0704  . piperacillin-tazobactam (ZOSYN)  IV 3.375 g (05/24/18 0741)  . vancomycin Stopped (05/24/18 4496)    Active Problems:   HCAP (healthcare-associated pneumonia)   Critical Care Time spent: 64 minutes  Irwin Brakeman, MD [facility] 05/24/2018, 9:17 AM    LOS: 1 day  How to contact the [facility] Attending or Consulting provider [place] or covering provider during after hours [place], for this patient?  1. Check the care team in [facility] and look for a) attending/consulting [facility] provider listed and b) the [facility] team listed 2. Log into [email or web address] and use [place]'s universal password to access. If you do not have the password, please contact the hospital operator. 3. Locate the [facility] provider you are looking for under [facility] and page to a number that you can be directly reached. 4. If you still have difficulty reaching the provider, please page the [facility] (Director on Call) for the Hospitalists listed on amion for assistance.

## 2018-05-25 ENCOUNTER — Inpatient Hospital Stay (HOSPITAL_COMMUNITY): Payer: 59

## 2018-05-25 ENCOUNTER — Inpatient Hospital Stay: Payer: Self-pay

## 2018-05-25 ENCOUNTER — Ambulatory Visit (HOSPITAL_COMMUNITY): Payer: 59

## 2018-05-25 ENCOUNTER — Ambulatory Visit (HOSPITAL_COMMUNITY): Payer: 59 | Admitting: Nurse Practitioner

## 2018-05-25 DIAGNOSIS — E43 Unspecified severe protein-calorie malnutrition: Secondary | ICD-10-CM

## 2018-05-25 LAB — COMPREHENSIVE METABOLIC PANEL
ALBUMIN: 1.4 g/dL — AB (ref 3.5–5.0)
ALT: 50 U/L — ABNORMAL HIGH (ref 0–44)
AST: 56 U/L — ABNORMAL HIGH (ref 15–41)
Alkaline Phosphatase: 76 U/L (ref 38–126)
Anion gap: 5 (ref 5–15)
BILIRUBIN TOTAL: 0.5 mg/dL (ref 0.3–1.2)
BUN: 14 mg/dL (ref 6–20)
CO2: 22 mmol/L (ref 22–32)
Calcium: 8.1 mg/dL — ABNORMAL LOW (ref 8.9–10.3)
Chloride: 119 mmol/L — ABNORMAL HIGH (ref 98–111)
Creatinine, Ser: <0.3 mg/dL — ABNORMAL LOW (ref 0.44–1.00)
GLUCOSE: 154 mg/dL — AB (ref 70–99)
POTASSIUM: 3.6 mmol/L (ref 3.5–5.1)
Sodium: 146 mmol/L — ABNORMAL HIGH (ref 135–145)
Total Protein: 3.9 g/dL — ABNORMAL LOW (ref 6.5–8.1)

## 2018-05-25 LAB — BLOOD GAS, ARTERIAL
ACID-BASE DEFICIT: 1.3 mmol/L (ref 0.0–2.0)
Bicarbonate: 22.7 mmol/L (ref 20.0–28.0)
FIO2: 80
O2 Saturation: 94 %
PH ART: 7.32 — AB (ref 7.350–7.450)
Patient temperature: 37
pCO2 arterial: 47.9 mmHg (ref 32.0–48.0)
pO2, Arterial: 82.1 mmHg — ABNORMAL LOW (ref 83.0–108.0)

## 2018-05-25 LAB — CBC WITH DIFFERENTIAL/PLATELET
Abs Immature Granulocytes: 0.09 10*3/uL — ABNORMAL HIGH (ref 0.00–0.07)
Basophils Absolute: 0 10*3/uL (ref 0.0–0.1)
Basophils Relative: 0 %
Eosinophils Absolute: 0 10*3/uL (ref 0.0–0.5)
Eosinophils Relative: 0 %
HCT: 32.9 % — ABNORMAL LOW (ref 36.0–46.0)
Hemoglobin: 10.4 g/dL — ABNORMAL LOW (ref 12.0–15.0)
Immature Granulocytes: 1 %
LYMPHS PCT: 2 %
Lymphs Abs: 0.2 10*3/uL — ABNORMAL LOW (ref 0.7–4.0)
MCH: 29.7 pg (ref 26.0–34.0)
MCHC: 31.6 g/dL (ref 30.0–36.0)
MCV: 94 fL (ref 80.0–100.0)
Monocytes Absolute: 0.1 10*3/uL (ref 0.1–1.0)
Monocytes Relative: 2 %
Neutro Abs: 8 10*3/uL — ABNORMAL HIGH (ref 1.7–7.7)
Neutrophils Relative %: 95 %
Platelets: 60 10*3/uL — ABNORMAL LOW (ref 150–400)
RBC: 3.5 MIL/uL — ABNORMAL LOW (ref 3.87–5.11)
RDW: 17.8 % — ABNORMAL HIGH (ref 11.5–15.5)
WBC: 8.5 10*3/uL (ref 4.0–10.5)
nRBC: 0 % (ref 0.0–0.2)

## 2018-05-25 LAB — MAGNESIUM: Magnesium: 1.7 mg/dL (ref 1.7–2.4)

## 2018-05-25 MED ORDER — IOPAMIDOL (ISOVUE-370) INJECTION 76%
100.0000 mL | Freq: Once | INTRAVENOUS | Status: AC | PRN
  Administered 2018-05-25: 100 mL via INTRAVENOUS

## 2018-05-25 MED ORDER — SODIUM CHLORIDE 0.9% FLUSH: 10.0000 mL | INTRAVENOUS | Status: DC | PRN

## 2018-05-25 MED ORDER — METHYLPREDNISOLONE SODIUM SUCC 125 MG IJ SOLR
80.0000 mg | Freq: Three times a day (TID) | INTRAMUSCULAR | Status: DC
  Administered 2018-05-25 – 2018-05-27 (×6): 80 mg via INTRAVENOUS
  Filled 2018-05-25 (×7): qty 2

## 2018-05-25 MED ORDER — CHLORHEXIDINE GLUCONATE CLOTH 2 % EX PADS
6.0000 | MEDICATED_PAD | Freq: Every day | CUTANEOUS | Status: DC
  Administered 2018-05-25 – 2018-06-04 (×11): 6 via TOPICAL

## 2018-05-25 MED ORDER — SODIUM CHLORIDE 0.9% FLUSH
10.0000 mL | Freq: Two times a day (BID) | INTRAVENOUS | Status: DC
  Administered 2018-05-25 – 2018-05-27 (×3): 10 mL
  Administered 2018-05-27: 30 mL
  Administered 2018-05-28: 10 mL
  Administered 2018-05-28 – 2018-05-29 (×2): 30 mL
  Administered 2018-05-29 – 2018-06-04 (×11): 10 mL

## 2018-05-25 MED ORDER — LEVALBUTEROL HCL 0.63 MG/3ML IN NEBU: 0.6300 mg | INHALATION_SOLUTION | Freq: Four times a day (QID) | RESPIRATORY_TRACT | Status: DC | PRN

## 2018-05-25 NOTE — Progress Notes (Signed)
Palliative:  I met today again with Sharon Berg but she is BiPAP dependent at this time so I spoke more with her husband, Sharon Berg, and her daughter, Sharon Berg. We discussed concern that she has worsening respiratory status now BiPAP dependent on 100% FiO2 along with need for vasopressors. We discussed concern that she could easily be nearing a need for intubation. I fear that she is so debilitated from chemo and her cancer that her pneumonia is not responding well to our treatments and that her body is struggling to fight infection. CT is process for more info and to r/o PE. They ask about having surgery to remove her tumor and I explain that this is not an option with stage IIIb lung cancer and also with significant infection. We discussed current status with acute illness and reviewed past scans including PET scan and chest xrays to explain why surgery is not the answer. Sharon Berg tells me that he is afraid that if she is intubated that she will never be able to come off - I acknowledge that this is also a concern that I share.   At this time family would opt for full aggressive treatment including intubation, bronch, etc to try and help [name] improve. However they also acknowledge that they would not want her to just be kept alive on machines if there is no hope after continued aggressive care.   All questions/concerns addressed. Emotional support provided.   Plan: - Continue full aggressive care.  - Intubate if indicated and necessary. Daughter, Sharon Berg, would like to be notified prior to intubation if possible (she understands that this is often done emergently and not always possible).   92 min  Vinie Sill, NP Palliative Medicine Team Pager # [phone] (M-F 8a-5p) Team Phone # [phone] (Nights/Weekends)

## 2018-05-25 NOTE — Progress Notes (Signed)
Removed patient from bipap for less than 3 minutes and placed on HFNC at 15lpm. Patients SPO2 dropped into the 30s. Placed patient back on BIpap with increased pressure 16/8 and 100% FIO2. Her SPO2 recovered to 94% at this time. RT will cont to monitor throughout the day.

## 2018-05-25 NOTE — Consult Note (Signed)
I have discussed the findings of the CT scan of the chest with the patient's husband and her daughter by reviewing the films with them.  There is a substantial enlargement of the cavitary lesion in the right lung which directly communicates with right mainstem bronchus and has an air-fluid level.  There is also diffuse interstitial opacities bilaterally.  There is extensive consolidative opacity of the right lung.  I have told him that she is critically ill.  I have also talked with Dr. Dyann Kief.

## 2018-05-25 NOTE — Progress Notes (Addendum)
Removed bipap to perform mouth care on patient, patient placed on high flow.  Patient desat, patient placed back on bipap. Patient family at bedside

## 2018-05-25 NOTE — Progress Notes (Signed)
Peripherally Inserted Central Catheter/Midline Placement  The IV Nurse has discussed with the patient and/or persons authorized to consent for the patient, the purpose of this procedure and the potential benefits and risks involved with this procedure.  The benefits include less needle sticks, lab draws from the catheter, and the patient may be discharged home with the catheter. Risks include, but not limited to, infection, bleeding, blood clot (thrombus formation), and puncture of an artery; nerve damage and irregular heartbeat and possibility to perform a PICC exchange if needed/ordered by physician.  Alternatives to this procedure were also discussed.  Bard Power PICC patient education guide, fact sheet on infection prevention and patient information card has been provided to patient /or left at bedside.    PICC/Midline Placement Documentation  PICC Double Lumen 05/25/18 PICC Right Brachial 37 cm 0 cm (Active)  Indication for Insertion or Continuance of Line Limited venous access - need for IV therapy >5 days (PICC only) 05/25/2018 10:25 AM  Exposed Catheter (cm) 0 cm 05/25/2018 10:25 AM  Site Assessment Clean;Dry;Intact 05/25/2018 10:25 AM  Lumen #1 Status Flushed;Blood return noted 05/25/2018 10:25 AM  Lumen #2 Status Flushed;Blood return noted 05/25/2018 10:25 AM  Dressing Type Transparent 05/25/2018 10:25 AM  Dressing Status Clean;Dry;Intact;Antimicrobial disc in place 05/25/2018 10:25 AM  Dressing Intervention New dressing 05/25/2018 10:25 AM  Dressing Change Due 06/01/18 05/25/2018 10:25 AM   Husband signed consent at bedside    Synthia Innocent 05/25/2018, 10:27 AM

## 2018-05-25 NOTE — Progress Notes (Signed)
PROGRESS NOTE  JAYNIA FENDLEY  PYP:[id number]  DOB: 1965/09/15  DOA: 06/01/2018 PCP: [name], MD   Brief Admission Hx: 53 year old female being actively treated for lung cancer since December 2019 was sent to the ED from the cancer center with hypotension, hypoxic, shortness of breath and diagnosed with pneumonia.  MDM/Assessment & Plan:   1. HCAP-patient is immunocompromised and currently receiving chemotherapy however there is concern for postobstructive pneumonia with anaerobic infection given the cavitation in the lung seen on recent CT.  She was also noted to have a foul-smelling sputum.  Continue Zosyn and at this point given neg MRSA PCR will d/c  vancomycin.  CTA chest has r/o PE. Worsening cavitary lesion, which directly communicates with the right mainstem bronchus and has a mild fluid level suggesting fistulization.  There is also diffuse interstitial opacities bilaterally.  An extensive consolidative opacity of the right lung.  Following recommendations by pulmonologist patient will be started on Solu-Medrol to assist with any component of pneumonitis. 2. Sepsis secondary to pneumonia- continue IV fluid resuscitation and current antibiotics. Lactic acid stable at 2.1 3. Right lung adenocarcinoma-stage IIIb.  This was seen on CT 03/09/2018.  The patient has currently completed 6 courses of chemotherapy.  She is having a difficult time tolerating her chemotherapy.  She has poor oral intake due to vomiting.  She appears very deconditioned.  Oncology consult has been requested.  Requested goals of care consultation through palliative care as well. 4. Nausea and vomiting-suspect this is chemotherapy-induced.  She is being treated with IV fluids and supportive therapy.  She is tachycardic and hypotensive which is improving with hydration.  She has been followed in the stepdown unit closely.  Zofran ordered for nausea symptoms.  She is on a clear liquid diet for bowel rest. 5. Acute  respiratory failure-secondary to HCAP/post-obstructive PNA and possible component from pneumonitis/lymphangitic spread..  The patient is not on oxygen at home.  She is being supported with bipap and I have discussed CT chest findings with critical care/pulmonology; will add solumedrol and make decision for intubation. If intubated will transfer to [facility] for bronchoscopy and further pulmonary care. Continue Zosyn. 6. Chronic right chest wall pain- she has been worked up for this with recent hospitalizations and has been treated with narcotics. Will continue PRN analgesics, being cautious with resp depression. 7. Depression/anxiety- resume home alprazolam and Zoloft when able to tolerate PO's.. 8. Severe protein calorie malnutrition: Currently unable to tolerate by mouth and essentially BiPAP dependent.  Have discussed with family members who understand and agree that the patient is intubated we can use transient NG tube for nutrition purposes.  DVT prophylaxis: Lovenox Code Status: Full Family Communication: spouse Disposition Plan: stepdown ICU; patient continue to be critically ill and experiencing acute resp failure; unable to wean off BIPAP. Discussed with family and will have final decision regarding intubation in am. Case discussed with oncology and critical care/pulmonology; if intubated will transfer to [facility]/ICU.  Consultants:  Oncology  Critical care/pulmonology  Palliative care  Procedures:  See below for x-ray reports   Antimicrobials:  Zosyn 2/3 >  Vancomycin 2/3 >2/5  Subjective: Afebrile, with improved BP with use of pressors, no nausea, no vomiting. Acute resp failure and inability to be weaned off BIPAP currently.  Objective: Vitals:   05/25/18 1900 05/25/18 1949 05/25/18 2000 05/25/18 2100  BP:   123/70 122/76  Pulse: (!) 101 (!) 101 (!) 109 (!) 111  Resp: (!) 26 (!) 29 (!) 24 Marland Kitchen)  33  Temp:      TempSrc:      SpO2: 97% 97% 98% 96%  Weight:      Height:         Intake/Output Summary (Last 24 hours) at 05/25/2018 2242 Last data filed at 05/25/2018 6294 Gross per 24 hour  Intake -  Output 2000 ml  Net -2000 ml   Filed Weights   05/24/18 0243 05/24/18 0421 05/25/18 0500  Weight: 57 kg 57 kg 57.3 kg     REVIEW OF SYSTEMS  As per history otherwise all reviewed and reported negative  Exam: General exam: chronically ill in appearance, significantly underweight, with acute resp distress and unable to speak in full sentences. BIPAP in place. Afebrile. Able to follow commands.  Respiratory system: diffuse rhonchi, positive exp wheezing, decrease BS bases (R > L); mild use of accessory muscles and wearing BIPAP with 80% FIO2 Cardiovascular system: mild tachycardia, no rubs, no gallops, no JVD. Gastrointestinal system: Abdomen is nondistended, soft and nontender. No organomegaly or masses felt. Normal bowel sounds heard. Central nervous system: Alert and oriented X2. No focal neurological deficits. Extremities: No C/C/E, +pedal pulses Skin: No rashes, lesions or ulcers Psychiatry: Judgement and insight appear to be poor currently given current mentation and acute distress.   Data Reviewed: Basic Metabolic Panel: Recent Labs  Lab 06/03/2018 1728 05/24/18 0428 05/25/18 0445  NA 135 138 146*  K 3.3* 3.5 3.6  CL 103 109 119*  CO2 22 22 22   GLUCOSE 56* 162* 154*  BUN 31* 22* 14  CREATININE 0.59 0.37* <0.30*  CALCIUM 7.6* 7.6* 8.1*  MG 2.4  --  1.7   Liver Function Tests: Recent Labs  Lab 06/14/2018 1728 05/25/18 0445  AST 50* 56*  ALT 39 50*  ALKPHOS 63 76  BILITOT 0.8 0.5  PROT 4.7* 3.9*  ALBUMIN 1.9* 1.4*   CBC: Recent Labs  Lab 06/08/2018 1728 05/24/18 0428 05/25/18 0445  WBC 6.1 3.4* 8.5  NEUTROABS 4.8  --  8.0*  HGB 11.0* 9.7* 10.4*  HCT 34.3* 30.2* 32.9*  MCV 93.0 91.2 94.0  PLT 128* 92* 60*   CBG (last 3)  Recent Labs    05/28/2018 2004  GLUCAP 76   Recent Results (from the past 240 hour(s))  Culture, blood  (Routine x 2)     Status: None (Preliminary result)   Collection Time: 06/03/2018  5:28 PM  Result Value Ref Range Status   Specimen Description BLOOD PORTA CATH DRAWN BY RN  Final   Special Requests   Final    BOTTLES DRAWN AEROBIC AND ANAEROBIC Blood Culture adequate volume   Culture   Final    NO GROWTH 2 DAYS Performed at [facility], [address] [place], [place] [address]    Report Status PENDING  Incomplete  Culture, blood (Routine x 2)     Status: None (Preliminary result)   Collection Time: 06/14/2018  5:28 PM  Result Value Ref Range Status   Specimen Description BLOOD PORTA CATH DRAWN BY RN  Final   Special Requests   Final    BOTTLES DRAWN AEROBIC ONLY Blood Culture results may not be optimal due to an inadequate volume of blood received in culture bottles   Culture   Final    NO GROWTH 2 DAYS Performed at [facility], [address] [place], [place] [address]    Report Status PENDING  Incomplete  MRSA PCR Screening     Status: None   Collection Time:  05/24/18  2:04 AM  Result Value Ref Range Status   MRSA by PCR NEGATIVE NEGATIVE Final    Comment:        The GeneXpert MRSA Assay (FDA approved for NASAL specimens only), is one component of a comprehensive MRSA colonization surveillance program. It is not intended to diagnose MRSA infection nor to guide or monitor treatment for MRSA infections. Performed at [facility], [address] [place], [place] [address]      Studies: Ct Angio Chest Pe W Or Wo Contrast  Result Date: 05/25/2018 CLINICAL DATA:  Suspect PE EXAM: CT ANGIOGRAPHY CHEST WITH CONTRAST TECHNIQUE: Multidetector CT imaging of the chest was performed using the standard protocol during bolus administration of intravenous contrast. Multiplanar CT image reconstructions and MIPs were obtained to evaluate the vascular anatomy. CONTRAST:  140mL ISOVUE-370 IOPAMIDOL (ISOVUE-370) INJECTION 76% COMPARISON:  Chest radiograph, 06/05/2018, CT chest, 04/23/2018  FINDINGS: Cardiovascular: Satisfactory opacification of the pulmonary arteries to the segmental level. No evidence of pulmonary embolism. Normal heart size. No pericardial effusion. Mediastinum/Nodes: Unchanged right hilar and mediastinal soft tissue thickening. Thyroid gland, trachea, and esophagus demonstrate no significant findings. Lungs/Pleura: Substantial interval enlargement of a large cavitary lesion of the suprahilar right lung and right upper lobe which contains an air-fluid level demonstrates fistulization to the right mainstem bronchus (series 7, image 56). Underlying emphysema. There is extensive consolidative opacity of the right lung with diffuse interstitial opacity bilaterally, small bilateral pleural effusions, and associated dependent atelectasis or consolidation. Upper Abdomen: No acute abnormality. Musculoskeletal: No chest wall abnormality. No acute or significant osseous findings. Review of the MIP images confirms the above findings. IMPRESSION: 1.  Negative examination for pulmonary embolism. 2. 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 3. Underlying emphysema. There is extensive consolidative opacity of the right lung with diffuse interstitial opacity bilaterally, small bilateral pleural effusions, and associated dependent atelectasis or consolidation. Findings are substantially worsened compared to prior examination dated 04/23/2018 and multifocal infection, edema, and lymphangitic spread of malignancy are difficult to distinguish differential considerations. Electronically Signed   By: Eddie Candle M.D.   On: 05/25/2018 14:09   [place] Ekg Site Rite  Result Date: 05/25/2018 If Site Rite image not attached, placement could not be confirmed due to current cardiac rhythm.    Scheduled Meds: . chlorhexidine   15 mL Mouth Rinse BID  . Chlorhexidine Gluconate Cloth  6 each Topical Daily  . enoxaparin (LOVENOX) injection  40 mg Subcutaneous Q24H  . feeding supplement  1 Container Oral TID BM  . fluconazole  100 mg Oral Daily  . ipratropium  0.5 mg Nebulization Q6H  . levalbuterol  0.63 mg Nebulization Q6H  . mouth rinse  15 mL Mouth Rinse q12n4p  . methylPREDNISolone (SOLU-MEDROL) injection  80 mg Intravenous Q8H  . sertraline  150 mg Oral q morning - 10a  . sodium chloride flush  10-40 mL Intracatheter Q12H  . traZODone  300 mg Oral QHS   Continuous Infusions: . norepinephrine (LEVOPHED) Adult infusion 10 mcg/min (05/25/18 1649)  . piperacillin-tazobactam (ZOSYN)  IV 3.375 g (05/25/18 1652)    Active Problems:   Anxiety and depression   Complex partial epilepsy ([place])   Cancer of right lung ([place])   Primary lung cancer, right (HCC)   Non-small cell carcinoma of lung, right (HCC)   Intractable nausea and  vomiting   HCAP (healthcare-associated pneumonia)   Sepsis with acute hypoxic respiratory failure without septic shock (HCC)   Severe malnutrition (HCC)   Protein-calorie malnutrition, severe   Critical Care Time spent: 40 minutes; more than 50% of the time dedicated on face by face evaluation, coordination of care, discussing with other specialist involved in her care and reviewing images/old records. Family was updated at bedside and extensive discussion about anticipated trajectory due to her high oxygen requirements and failure to wean off BIPAP provided.   Barton Dubois, MD [facility]  05/25/2018, 10:42 PM    LOS: 2 days  How to contact the [facility] Attending or Consulting provider [place] or covering provider during after hours [place], for this patient?  1. Check the care team in [facility] and look for a) attending/consulting [facility] provider listed and b) the [facility] team listed 2. Log into [email or web address] and use [place] universal password to access. If you do not have the password,  please contact the hospital operator. 3. Locate the [facility] provider you are looking for under [facility] and page to a number that you can be directly reached. 4. If you still have difficulty reaching the provider, please page the [facility] (Director on Call) for the Hospitalists listed on amion for assistance.

## 2018-05-26 ENCOUNTER — Inpatient Hospital Stay (HOSPITAL_COMMUNITY): Payer: 59 | Admitting: Anesthesiology

## 2018-05-26 ENCOUNTER — Inpatient Hospital Stay (HOSPITAL_COMMUNITY): Payer: 59

## 2018-05-26 ENCOUNTER — Ambulatory Visit (HOSPITAL_COMMUNITY): Payer: 59

## 2018-05-26 DIAGNOSIS — Z978 Presence of other specified devices: Secondary | ICD-10-CM

## 2018-05-26 DIAGNOSIS — C3401 Malignant neoplasm of right main bronchus: Secondary | ICD-10-CM

## 2018-05-26 DIAGNOSIS — J9600 Acute respiratory failure, unspecified whether with hypoxia or hypercapnia: Secondary | ICD-10-CM

## 2018-05-26 DIAGNOSIS — J9601 Acute respiratory failure with hypoxia: Secondary | ICD-10-CM

## 2018-05-26 DIAGNOSIS — J189 Pneumonia, unspecified organism: Secondary | ICD-10-CM

## 2018-05-26 DIAGNOSIS — C3491 Malignant neoplasm of unspecified part of right bronchus or lung: Secondary | ICD-10-CM

## 2018-05-26 LAB — BLOOD GAS, ARTERIAL
Acid-Base Excess: 0.6 mmol/L (ref 0.0–2.0)
Bicarbonate: 23.5 mmol/L (ref 20.0–28.0)
FIO2: 100
O2 Saturation: 91.3 %
Patient temperature: 36.1
pCO2 arterial: 69.9 mmHg (ref 32.0–48.0)
pH, Arterial: 7.22 — ABNORMAL LOW (ref 7.350–7.450)
pO2, Arterial: 82.9 mmHg — ABNORMAL LOW (ref 83.0–108.0)

## 2018-05-26 LAB — BASIC METABOLIC PANEL
ANION GAP: 9 (ref 5–15)
BUN: 14 mg/dL (ref 6–20)
CO2: 25 mmol/L (ref 22–32)
Calcium: 8.7 mg/dL — ABNORMAL LOW (ref 8.9–10.3)
Chloride: 119 mmol/L — ABNORMAL HIGH (ref 98–111)
Creatinine, Ser: 0.33 mg/dL — ABNORMAL LOW (ref 0.44–1.00)
GFR calc Af Amer: >60 mL/min (ref >60)
GFR calc non Af Amer: >60 mL/min (ref >60)
Glucose, Bld: 114 mg/dL — ABNORMAL HIGH (ref 70–99)
Potassium: 3.3 mmol/L — ABNORMAL LOW (ref 3.5–5.1)
SODIUM: 153 mmol/L — AB (ref 135–145)

## 2018-05-26 LAB — POCT I-STAT 7, (LYTES, BLD GAS, ICA,H+H)
Acid-Base Excess: 3 mmol/L — ABNORMAL HIGH (ref 0.0–2.0)
Bicarbonate: 30.2 mmol/L — ABNORMAL HIGH (ref 20.0–28.0)
Calcium, Ion: 1.33 mmol/L (ref 1.15–1.40)
HEMATOCRIT: 28 % — AB (ref 36.0–46.0)
HEMOGLOBIN: 9.5 g/dL — AB (ref 12.0–15.0)
O2 Saturation: 99 %
PCO2 ART: 58.5 mmHg — AB (ref 32.0–48.0)
Patient temperature: 98.7
Potassium: 3.6 mmol/L (ref 3.5–5.1)
Sodium: 152 mmol/L — ABNORMAL HIGH (ref 135–145)
TCO2: 32 mmol/L (ref 22–32)
pH, Arterial: 7.321 — ABNORMAL LOW (ref 7.350–7.450)
pO2, Arterial: 174 mmHg — ABNORMAL HIGH (ref 83.0–108.0)

## 2018-05-26 LAB — CBC
HCT: 32 % — ABNORMAL LOW (ref 36.0–46.0)
Hemoglobin: 10 g/dL — ABNORMAL LOW (ref 12.0–15.0)
MCH: 28.9 pg (ref 26.0–34.0)
MCHC: 31.3 g/dL (ref 30.0–36.0)
MCV: 92.5 fL (ref 80.0–100.0)
Platelets: 24 10*3/uL — CL (ref 150–400)
RBC: 3.46 MIL/uL — ABNORMAL LOW (ref 3.87–5.11)
RDW: 18.2 % — ABNORMAL HIGH (ref 11.5–15.5)
WBC: 12.2 10*3/uL — AB (ref 4.0–10.5)
nRBC: 0.2 % (ref 0.0–0.2)

## 2018-05-26 LAB — MAGNESIUM: Magnesium: 1.8 mg/dL (ref 1.7–2.4)

## 2018-05-26 LAB — TRIGLYCERIDES: Triglycerides: 237 mg/dL — ABNORMAL HIGH (ref <150)

## 2018-05-26 LAB — PHOSPHORUS: Phosphorus: 3.8 mg/dL (ref 2.5–4.6)

## 2018-05-26 LAB — GLUCOSE, CAPILLARY
Glucose-Capillary: 137 mg/dL — ABNORMAL HIGH (ref 70–99)
Glucose-Capillary: 140 mg/dL — ABNORMAL HIGH (ref 70–99)

## 2018-05-26 MED ORDER — SUCCINYLCHOLINE CHLORIDE 20 MG/ML IJ SOLN
INTRAMUSCULAR | Status: DC | PRN
  Administered 2018-05-26: 60 mg via INTRAVENOUS

## 2018-05-26 MED ORDER — SODIUM CHLORIDE 0.9 % IV SOLN
INTRAVENOUS | Status: DC | PRN
  Administered 2018-05-26 – 2018-05-31 (×5): 250 mL via INTRAVENOUS

## 2018-05-26 MED ORDER — FENTANYL CITRATE (PF) 100 MCG/2ML IJ SOLN
100.0000 ug | INTRAMUSCULAR | Status: DC | PRN
  Administered 2018-05-27 – 2018-05-31 (×13): 100 ug via INTRAVENOUS
  Filled 2018-05-26 (×10): qty 2

## 2018-05-26 MED ORDER — INSULIN ASPART 100 UNIT/ML ~~LOC~~ SOLN: 0.0000 [IU] | SUBCUTANEOUS | Status: DC

## 2018-05-26 MED ORDER — CHLORHEXIDINE GLUCONATE 0.12% ORAL RINSE (MEDLINE KIT)
15.0000 mL | Freq: Two times a day (BID) | OROMUCOSAL | Status: DC
  Administered 2018-05-26 – 2018-06-01 (×14): 15 mL via OROMUCOSAL

## 2018-05-26 MED ORDER — PANTOPRAZOLE SODIUM 40 MG IV SOLR
40.0000 mg | Freq: Every day | INTRAVENOUS | Status: DC
  Administered 2018-05-26 – 2018-05-27 (×2): 40 mg via INTRAVENOUS
  Filled 2018-05-26 (×2): qty 40

## 2018-05-26 MED ORDER — DEXTROSE-NACL 5-0.45 % IV SOLN
INTRAVENOUS | Status: DC
  Administered 2018-05-26 – 2018-05-27 (×2): via INTRAVENOUS

## 2018-05-26 MED ORDER — ORAL CARE MOUTH RINSE
15.0000 mL | OROMUCOSAL | Status: DC
  Administered 2018-05-26 – 2018-06-01 (×59): 15 mL via OROMUCOSAL

## 2018-05-26 MED ORDER — BISACODYL 10 MG RE SUPP: 10.0000 mg | Freq: Every day | RECTAL | Status: DC | PRN

## 2018-05-26 MED ORDER — FENTANYL CITRATE (PF) 100 MCG/2ML IJ SOLN
100.0000 ug | INTRAMUSCULAR | Status: AC | PRN
  Administered 2018-05-26 – 2018-05-29 (×3): 100 ug via INTRAVENOUS
  Filled 2018-05-26 (×3): qty 2

## 2018-05-26 MED ORDER — INSULIN ASPART 100 UNIT/ML ~~LOC~~ SOLN
0.0000 [IU] | SUBCUTANEOUS | Status: DC
  Administered 2018-05-26 – 2018-05-27 (×4): 2 [IU] via SUBCUTANEOUS
  Administered 2018-05-27 – 2018-05-28 (×3): 3 [IU] via SUBCUTANEOUS
  Administered 2018-05-28 (×2): 5 [IU] via SUBCUTANEOUS
  Administered 2018-05-28 – 2018-05-29 (×9): 3 [IU] via SUBCUTANEOUS
  Administered 2018-05-30: 2 [IU] via SUBCUTANEOUS
  Administered 2018-05-30: 5 [IU] via SUBCUTANEOUS
  Administered 2018-05-30 (×2): 3 [IU] via SUBCUTANEOUS
  Administered 2018-05-30: 2 [IU] via SUBCUTANEOUS
  Administered 2018-05-30: 5 [IU] via SUBCUTANEOUS
  Administered 2018-05-31 (×2): 2 [IU] via SUBCUTANEOUS
  Administered 2018-05-31 – 2018-06-01 (×3): 3 [IU] via SUBCUTANEOUS
  Administered 2018-06-01 (×2): 2 [IU] via SUBCUTANEOUS
  Administered 2018-06-01 (×2): 3 [IU] via SUBCUTANEOUS
  Administered 2018-06-01: 2 [IU] via SUBCUTANEOUS
  Administered 2018-06-01: 3 [IU] via SUBCUTANEOUS
  Administered 2018-06-02 – 2018-06-03 (×6): 2 [IU] via SUBCUTANEOUS
  Administered 2018-06-04 (×3): 3 [IU] via SUBCUTANEOUS

## 2018-05-26 MED ORDER — ETOMIDATE 2 MG/ML IV SOLN
INTRAVENOUS | Status: DC | PRN
  Administered 2018-05-26: 12 mg via INTRAVENOUS

## 2018-05-26 MED ORDER — PIPERACILLIN-TAZOBACTAM 3.375 G IVPB
3.3750 g | Freq: Three times a day (TID) | INTRAVENOUS | Status: DC
  Administered 2018-05-26 – 2018-05-31 (×14): 3.375 g via INTRAVENOUS
  Filled 2018-05-26 (×15): qty 50

## 2018-05-26 MED ORDER — PROPOFOL 1000 MG/100ML IV EMUL
INTRAVENOUS | Status: AC
  Administered 2018-05-26: 25 ug/kg/min via INTRAVENOUS
  Filled 2018-05-26: qty 100

## 2018-05-26 MED ORDER — FENTANYL CITRATE (PF) 100 MCG/2ML IJ SOLN
INTRAMUSCULAR | Status: AC
  Administered 2018-05-26: 100 ug
  Filled 2018-05-26: qty 2

## 2018-05-26 MED ORDER — PROPOFOL 1000 MG/100ML IV EMUL
0.0000 ug/kg/min | INTRAVENOUS | Status: DC
  Administered 2018-05-26: 25 ug/kg/min via INTRAVENOUS
  Administered 2018-05-26: 10 ug/kg/min via INTRAVENOUS
  Administered 2018-05-26 – 2018-05-27 (×3): 20 ug/kg/min via INTRAVENOUS
  Administered 2018-05-27: 15 ug/kg/min via INTRAVENOUS
  Administered 2018-05-28: 40 ug/kg/min via INTRAVENOUS
  Administered 2018-05-28: 30 ug/kg/min via INTRAVENOUS
  Administered 2018-05-28 (×2): 40 ug/kg/min via INTRAVENOUS
  Administered 2018-05-29: 50 ug/kg/min via INTRAVENOUS
  Filled 2018-05-26 (×9): qty 100

## 2018-05-26 NOTE — Progress Notes (Signed)
Palliative:  I met again today with husband, [name], and daughter, Magda Paganini. They are tearful and have recently discussed with Dr. Dyann Kief and Dr. Delton Coombes. They agree with proceeding with intubation and subsequent transition to [facility]. They remain hopeful but also understand the gravity of the situation and understand that she may not survive this. 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 CT unable to distinguish between infection, edema, and lymphangitic spread of right lung cancer. They understand that if that intubation is indicated if they wish to continue aggressive care. Emotional support provided.   I will return to [facility] and will follow up Monday 05/30/18. Please call [phone] for acute palliative needs prior to Monday.   Exam: Restless on BiPAP.   Plan: - Intubate and transfer to [facility] for potential bronchoscopy  25 min  Vinie Sill, NP Palliative Medicine Team Pager # [phone] (M-F 8a-5p) Team Phone # [phone] (Nights/Weekends)

## 2018-05-26 NOTE — Progress Notes (Signed)
CRITICAL VALUE ALERT  Critical Value:  Platelet 24  Date & Time Notied:  05/26/18 @0856   Provider Notified: Dr. Dyann Kief  Orders Received/Actions taken: No new orders currently

## 2018-05-26 NOTE — H&P (Signed)
NAME:  BERENICE OEHLERT, MRN:  [id number], DOB:  [birth year], LOS: 3 ADMISSION DATE:  05/27/2018, CONSULTATION DATE:  05/26/2018 REFERRING MD:  Faylene Kurtz  CHIEF COMPLAINT:  Respiratory failure  Brief History   53 year old female, immunocompromised on chemo R lung adenocarcinoma IIIb, with sepsis due to HCAP transferred to [facility] from [place] on 05/26/18 after she was intubated for acute respiratory failure.    History of present illness   53 year old female with PMH adenocarcinoma of right lung, seizure disorder, depression, anxiety, migraine who presented to [facility] ED on 05/25/2018 for shortness of breath with associated cough and vomiting. Shortness of breath began 05/22/2018 and cough began 05/20/2018. Cough is productive with sputum that is purulent and dark brown in color. The patient endorsed vomiting which began 05/21/2018, and had poor oral intake x 3 days prior to presentation on 2/3. She endorsed recent illness, with recent hospitalization 1/4-1/6 2020 for PNA (aspiration and postobstructive) causing sepsis. She denied loose stools and abdominal pain. She endorsed chronic immunosuppression, receiving chemotherapy for lung cancer.   In ED at [facility], the patient was hypotensive, tachycardic and tachypneic. A CXR revealed R lung opacities, concerning for PNA and the patient was started on vanc/cefepime, later changed to pip/tazo and diflucan.   The continued treatment for HCAP. She underwent a CTA on 2/5 at which time PE was ruled out and it was noted that the underlying PNA was worse. Her respiratory status continued to decline, requiring BiPAP. With ongoing high BiPAP settings, the decision was made to intubate the patient 2/6 and transfer to [facility].  PCCM consulted for admission.    Past Medical History  Right Lung adenocarcinoma IIIb Migraine Seizure disorder Thrombocytopenia  Depression Anxiety  Significant Hospital Events   2/3> admitted to [facility] 2/6> intubated,  transferred to [facility]   Consults:  PCCM   Procedures:  2/6> endotracheal intubation   Significant Diagnostic Tests:  2/3 CXR> RUL opacities, RLL opacity. -- personally reviewed 2/6 2/5 CTA chest> no PE visualized on CTA. Increased cavitary lesion of right lung and right upper lobe, fistulization of R mainstem bronchus. Diffuse opacities throughout R lung. Small bilateral pleural effusions.  2/6 CXR> Significant increase in opacities, bilaterally. Interval ETT placement-- Personally reviewed 2/6   Micro Data:   BCx 2/3> no growth at 3 days MRSA 2/4> neg   Antimicrobials:  Vancomycin 2/3-2/5 Cefepime 2/3  Pip-tazo 2/4>> Diflucan 2/4>>  Interim history/subjective:  Intubated for acute respiratory failure and transferred from [facility] to [facility] [place] for further management  Objective   Blood pressure 111/68, pulse (!) 125, temperature (!) 96.9 F (36.1 C), temperature source Axillary, resp. rate (!) 23, height 5\' 5"  (1.651 m), weight 61.2 kg, SpO2 95 %.    Vent Mode: PRVC FiO2 (%):  [70 %-100 %] 100 % Set Rate:  [18 bmp-22 bmp] 22 bmp Vt Set:  [450 mL] 450 mL PEEP:  [5 cmH20-8 cmH20] 8 cmH20 Plateau Pressure:  [20 cmH20-22 cmH20] 20 cmH20   Intake/Output Summary (Last 24 hours) at 05/26/2018 1659 Last data filed at 05/26/2018 1600 Gross per 24 hour  Intake 1651.16 ml  Output 1550 ml  Net 101.16 ml   Filed Weights   05/25/18 0500 05/26/18 0645 05/26/18 1654  Weight: 57.3 kg 60.6 kg 61.2 kg    Examination: General: middle aged female, on vent HENT: NCAT, ETT secure, PERRL, trachea midline  Lungs: Coarse throughout, bilaterally Cardiovascular: RRR, no r/g/m No JVD  Abdomen: soft,  round, non-distended, hypoactive x4 Extremities: No acute deformity no BUE BLE edema  Neuro: Sedated. Does not open eyes to voice, is not following commands  GU: foley  Skin: clean, dry, warm, intact   Resolved Hospital Problem list     Assessment & Plan:    Acute hypoxemic respiratory failure:  multifactorial at this point. CT concerning for extensive consolidative process in the R lung concerning for HCAP in this immunocompromised patient. 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  - Full vent support -Titrate PEEP/FiO2 for SpO2 > 92% - Follow CXR, ABG - Continue empiric Zosyn and diflucan. Vancomycin has been discontinued. - Continue solumedrol for possible pneumonitis  -Continue atrovent and xenopex nebs  - Planning to bronch and send cytology   Sepsis PNA -BCx from 2/3 NGTD P -continue fluc/pip-tazo as above -Trend WBC, UOP, Temperature - send tracheal aspirate  - goal MAPs > 65, norepi for goal  - Net positive 5L however was significantly hypovolemic on presentation due to poor PO intake, n/v for several days, continue mIVF     Adenocarcinoma IIIb  - s/p 6x chemo -home carbolatin, paclitaxel  P - holding at this time    Thrombocytopenia plt 24  P -Follow CBC, transfuse plt if <10 without bleeding or <50 with active bleeding -no chemical VTE ppx at this time (SCDs only for now)  Electrolyte Abnormalities Hypernatremia -likely hypovolemic hypernatremia given history P -Continue D5 0.45%NS mIVF -Monitor BMP, mag phos -Replace PRN    Malnutrition -NPO x several days P Acquire Enteral access Initiate enteral nutrition after placement Consult RDN for EN goals  Hyperglycemia, at risk -Critical Illness, systemic steroids -Will be starting EN  -SSI ordered   Best practice:  Diet: NPO  Pain/Anxiety/Delirium protocol (if indicated): Propofol, fentanyl VAP protocol (if indicated): Yes  DVT prophylaxis: SCDs GI prophylaxis: Protonix Glucose control: Monitor CBG, SSI as needed  Mobility: bedrest Code Status: Full  Family Communication: Husband updated by Dr. Elsworth Soho  Disposition: Admit to ICU   Labs   CBC: Recent Labs  Lab 05/24/2018 1728 05/24/18 0428 05/25/18 0445  05/26/18 0754  WBC 6.1 3.4* 8.5 12.2*  NEUTROABS 4.8  --  8.0*  --   HGB 11.0* 9.7* 10.4* 10.0*  HCT 34.3* 30.2* 32.9* 32.0*  MCV 93.0 91.2 94.0 92.5  PLT 128* 92* 60* 24*    Basic Metabolic Panel: Recent Labs  Lab 05/31/2018 1728 05/24/18 0428 05/25/18 0445 05/26/18 0754  NA 135 138 146* 153*  K 3.3* 3.5 3.6 3.3*  CL 103 109 119* 119*  CO2 22 22 22 25   GLUCOSE 56* 162* 154* 114*  BUN 31* 22* 14 14  CREATININE 0.59 0.37* <0.30* 0.33*  CALCIUM 7.6* 7.6* 8.1* 8.7*  MG 2.4  --  1.7 1.8  PHOS  --   --   --  3.8   GFR: Estimated Creatinine Clearance: 72.3 mL/min (A) (by C-G formula based on SCr of 0.33 mg/dL (L)). Recent Labs  Lab 05/22/2018 1728  05/24/18 0428 05/24/18 1018 05/24/18 1020 05/24/18 1427 05/24/18 1907 05/24/18 2156 05/25/18 0445 05/26/18 0754  PROCALCITON  --   --   --   --  4.03  --   --   --   --   --   WBC 6.1  --  3.4*  --   --   --   --   --  8.5 12.2*  LATICACIDVEN  2.3*   < > 2.3* 2.6*  --  2.7* 2.1* 2.1*  --   --    < > = values in this interval not displayed.    Liver Function Tests: Recent Labs  Lab 05/26/2018 1728 05/25/18 0445  AST 50* 56*  ALT 39 50*  ALKPHOS 63 76  BILITOT 0.8 0.5  PROT 4.7* 3.9*  ALBUMIN 1.9* 1.4*   No results for input(s): LIPASE, AMYLASE in the last 168 hours. No results for input(s): AMMONIA in the last 168 hours.  ABG    Component Value Date/Time   PHART 7.220 (L) 05/26/2018 1530   PCO2ART 69.9 (HH) 05/26/2018 1530   PO2ART 82.9 (L) 05/26/2018 1530   HCO3 23.5 05/26/2018 1530   ACIDBASEDEF 1.3 05/25/2018 1358   O2SAT 91.3 05/26/2018 1530     Coagulation Profile: Recent Labs  Lab 06/05/2018 1728  INR 1.15    Cardiac Enzymes: No results for input(s): CKTOTAL, CKMB, CKMBINDEX, TROPONINI in the last 168 hours.  HbA1C: Hemoglobin A1C  Date/Time Value Ref Range Status  10/30/2015 03:20 PM 5.2  Final   Hgb A1c MFr Bld  Date/Time Value Ref Range Status  04/23/2018 09:02 AM 5.5 4.8 - 5.6 % Final     Comment:    (NOTE) Pre diabetes:          5.7%-6.4% Diabetes:              >6.4% Glycemic control for   <7.0% adults with diabetes     CBG: Recent Labs  Lab 05/29/2018 2004  GLUCAP 76    Review of Systems:   Unable to obtain current ROS due to patient factors (intubated, sedated) Presenting ROS as per HPI, as obtained in chart.   Past Medical History  She,  has a past medical history of Anxiety, Depression, Lung cancer ([place]), Migraine, Pneumonia, and Seizures ([place]).   Surgical History    Past Surgical History:  Procedure Laterality Date  . ABDOMINAL HYSTERECTOMY  2000  . PORTACATH PLACEMENT Left 03/14/2018   Procedure: INSERTION PORT-A-CATH;  Surgeon: Aviva Signs, MD;  Location: [facility];  Service: General;  Laterality: Left;  Marland Kitchen VIDEO BRONCHOSCOPY WITH ENDOBRONCHIAL ULTRASOUND N/A 03/21/2018   Procedure: VIDEO BRONCHOSCOPY WITH ENDOBRONCHIAL ULTRASOUND;  Surgeon: Melrose Nakayama, MD;  Location: [facility];  Service: Thoracic;  Laterality: N/A;     Social History   reports that she has quit smoking. Her smoking use included cigarettes. She has a 4.56 pack-year smoking history. She has never used smokeless tobacco. She reports that she does not drink alcohol or use drugs.   Family History   Her family history includes Dementia in her paternal grandfather and paternal grandmother; Diabetes in her father; Heart disease in her maternal grandfather and maternal grandmother; Hypertension in her mother; Stroke in her maternal grandmother.   Allergies Allergies  Allergen Reactions  . Levaquin [Levofloxacin] Other (See Comments)    Myalgia  . Codeine Nausea And Vomiting     Home Medications  Prior to Admission medications   Medication Sig Start Date End Date Taking? Authorizing Provider  albuterol (PROVENTIL HFA;VENTOLIN HFA) 108 (90 Base) MCG/ACT inhaler Inhale 2 puffs into the lungs every 6 (six) hours as needed for wheezing or shortness of breath. 05/18/18  Yes Lockamy, Randi  L, NP-C  ALPRAZolam (XANAX) 1 MG tablet Take 1 tablet (1 mg total) by mouth 2 (two) times daily. TAKE 1/2 TO 1 TABLET TWICE A DAY FOR ANXIETY 05/18/18  Yes Lockamy, Randi L, NP-C  CARBOPLATIN IV Inject into the vein once a week.   Yes [provider]  fluconazole (DIFLUCAN) 100 MG tablet Take 100 mg by mouth daily.  05/16/18  Yes [provider]  HYDROcodone-acetaminophen (NORCO/VICODIN) 5-325 MG tablet Take 1 tablet by mouth every 4 (four) hours as needed for moderate pain. 05/18/18  Yes Lockamy, Randi L, NP-C  lidocaine (XYLOCAINE) 2 % solution Use as directed 15 mLs in the mouth or throat every 6 (six) hours as needed for mouth pain (Mixed with Magic Mouthwash).  05/10/18  Yes [provider]  lidocaine-prilocaine (EMLA) cream Apply 1 application topically See admin instructions. Applied to port 1 hour prior to appointment 03/29/18  Yes [provider]  magic mouthwash SOLN Take 5 mLs by mouth 4 (four) times daily as needed for mouth pain.    Yes [provider]  omeprazole (PRILOSEC OTC) 20 MG tablet Take 20 mg by mouth daily.  03/28/18 06/26/18 Yes [provider]  ondansetron (ZOFRAN ODT) 8 MG disintegrating tablet Take 1 tablet (8 mg total) by mouth every 8 (eight) hours as needed for nausea or vomiting (may cause constipation). 05/18/18  Yes Lockamy, Randi L, NP-C  PACLITAXEL IV Inject 78 mg into the vein once a week.   Yes [provider]  potassium chloride SA (K-DUR,KLOR-CON) 20 MEQ tablet Take 1 tablet (20 mEq total) by mouth 2 (two) times daily. 05/05/18  Yes Nat Christen, MD  prochlorperazine (COMPAZINE) 10 MG tablet Take 1 tablet (10 mg total) by mouth every 6 (six) hours as needed (Nausea or vomiting). 04/29/18  Yes Derek [name], MD  scopolamine (TRANSDERM-SCOP) 1 MG/3DAYS Place 1 patch (1.5 mg total) onto the skin every 3 (three) days. 05/05/18  Yes Derek [name], MD  sertraline (ZOLOFT) 100 MG tablet TAKE 1 AND 1/2  TABLETS ONCE DAILY Patient taking differently: Take 150 mg by mouth every morning.  05/02/18  Yes [name], MD  traZODone (DESYREL) 100 MG tablet Take 3 tablets (300 mg total) by mouth at bedtime. 05/02/18  Yes [name], MD     Critical care time: 40 min      Eliseo Gum MSN, AGACNP-BC [place] 05/26/2018, 6:03 PM

## 2018-05-26 NOTE — Progress Notes (Signed)
PROGRESS NOTE  Sharon Berg  UXN:[id number]  DOB: 07/31/1965  DOA: 05/22/2018 PCP: [name], MD   Brief Admission Hx: 53 year old female being actively treated for lung cancer since December 2019 was sent to the ED from the cancer center with hypotension, hypoxic, shortness of breath and diagnosed with pneumonia.  MDM/Assessment & Plan:   1. HCAP-patient is immunocompromised and currently receiving chemotherapy however there is concern for postobstructive pneumonia with anaerobic infection given the cavitation in the lung seen on recent CT.  She was also noted to have a foul-smelling sputum.  Continue Zosyn and at this point given neg MRSA PCR vancomycin discontinued on 05/25/18.  CTA chest has r/o PE. Worsening cavitary lesion, which directly communicates with the right mainstem bronchus and has a mild fluid level suggesting fistulization.  There is also diffuse interstitial opacities bilaterally.  An extensive consolidative opacity of the right lung.  Following recommendations by pulmonologist patient will continue Solu-Medrol to assist with any component of pneumonitis. Patient intubated and transfer to [facility] under critical care service.  2. Sepsis secondary to pneumonia- continue IV fluid resuscitation and current antibiotics. Lactic acid stable at 2.1. given hypernatremia IVF's changed to D51/2 NS. 3. Right lung adenocarcinoma-stage IIIb.  This was seen on CT 03/09/2018.  The patient has currently completed 6 courses of chemotherapy.  She is having a difficult time tolerating her chemotherapy.  She has poor oral intake due to vomiting.  She appears very deconditioned.  Oncology consult has been requested.  Requested goals of care consultation through palliative care as well; plan is to continue full scope of practice. 4. Nausea and vomiting-suspect this is chemotherapy-induced.  She is being treated with IV fluids and supportive therapy.  She is tachycardic and hypotensive which is  improving with hydration and use of levophed.  She would be move to ICU at [facility] after intubation. Zofran ordered for nausea symptoms.  She is NPO after been on BIPAP for over 36 hours.  5. Acute respiratory failure-secondary to HCAP/post-obstructive PNA and possible component from pneumonitis/lymphangitic spread..  The patient was not on oxygen at home.  She is being supported with bipap and I have once again discussed CT chest findings with critical care/pulmonology and family; will continue solumedrol as recommended for underlying pneumonitis. Patient would be intubated and mechanically ventilated. Transfer to [facility] for bronchoscopy and further pulmonary care. Continue Zosyn. 6. Chronic right chest wall pain- she has been worked up for this with recent hospitalizations and has been treated with narcotics. Will continue PRN analgesics as per sedation protocol now.  7. Depression/anxiety- resume home alprazolam and Zoloft when able to tolerate PO's. In between would be manage with analgesics/anxiolytics as per sedation protocol.  8. Severe protein calorie malnutrition: Currently unable to tolerate by mouth and essentially BiPAP dependent.  Have discussed with family members who understand and agree that once the patient is intubated we can use transient OG/NG tube for nutrition purposes and meds. 9. Thrombocytopenia: most likely associated with ongoing chemotherapy. No signs of overt bleeding. Will follow trend. No transfusion needed currently. Will discontinue Lovenox. SCD's ordered.  DVT prophylaxis: SCD's Code Status: Full Family Communication: spouse Disposition Plan: stepdown ICU; patient continue to be critically ill and experiencing acute resp failure; unable to wean off BIPAP (still requiring 70-80% FiO2 supplementation). Discussed with family and decision made for intubation and bronchoscopy. Case discussed with critical care/pulmonologist again today (Dr. Elsworth Soho); patient would be intubated and  transfer to [facility].  Consultants:  Oncology  Critical care/pulmonology  Palliative care  Procedures:  See below for x-ray reports   Antimicrobials:  Zosyn 2/3 >  Vancomycin 2/3 >2/5  Subjective: Afebrile, no CP. Still having significant resp distress and requiring BIPAP support 70-80% FiO2.  Objective: Vitals:   05/26/18 0900 05/26/18 0915 05/26/18 0930 05/26/18 1124  BP: 133/70 120/68 118/77   Pulse: (!) 114 (!) 116 (!) 113 (!) 110  Resp: (!) 26 (!) 23 (!) 29 (!) 25  Temp:    (!) 96.9 F (36.1 C)  TempSrc:    Axillary  SpO2: 96% 94% 96% 95%  Weight:      Height:        Intake/Output Summary (Last 24 hours) at 05/26/2018 1319 Last data filed at 05/26/2018 1884 Gross per 24 hour  Intake 1399.87 ml  Output 2850 ml  Net -1450.13 ml   Filed Weights   05/24/18 0421 05/25/18 0500 05/26/18 0645  Weight: 57 kg 57.3 kg 60.6 kg     REVIEW OF SYSTEMS  As per history otherwise all reviewed and reported negative  Exam: General exam: Alert, awake, oriented x 2; able to follow simple commands; afebrile, having increase tachypnea and mild use of accessory muscles while on BIPAP. Requiring 70-80% FiO2 support. Chronically ill and cachetic on exam.  Respiratory system: decrease BS at the bases. No wheezing, positive rhonchi. Using accessory muscles mildly.  Cardiovascular system: sinus tachycardia, no rubs, no gallops, no JVD. Gastrointestinal system: Abdomen is nondistended, soft and nontender. No organomegaly or masses felt. Normal bowel sounds heard. Central nervous system: Alert and oriented. No focal neurological deficits. Extremities: No C/C/E, +pedal pulses Skin: No rashes, lesions or ulcers Psychiatry: mood stable and appropriate for situation. Given acute distress insight impaired.  Data Reviewed: Basic Metabolic Panel: Recent Labs  Lab 06/10/2018 1728 05/24/18 0428 05/25/18 0445 05/26/18 0754  NA 135 138 146* 153*  K 3.3* 3.5 3.6 3.3*  CL 103 109 119* 119*   CO2 '22 22 22 25  '$ GLUCOSE 56* 162* 154* 114*  BUN 31* 22* 14 14  CREATININE 0.59 0.37* <0.30* 0.33*  CALCIUM 7.6* 7.6* 8.1* 8.7*  MG 2.4  --  1.7 1.8  PHOS  --   --   --  3.8   Liver Function Tests: Recent Labs  Lab 06/03/2018 1728 05/25/18 0445  AST 50* 56*  ALT 39 50*  ALKPHOS 63 76  BILITOT 0.8 0.5  PROT 4.7* 3.9*  ALBUMIN 1.9* 1.4*   CBC: Recent Labs  Lab 06/14/2018 1728 05/24/18 0428 05/25/18 0445 05/26/18 0754  WBC 6.1 3.4* 8.5 12.2*  NEUTROABS 4.8  --  8.0*  --   HGB 11.0* 9.7* 10.4* 10.0*  HCT 34.3* 30.2* 32.9* 32.0*  MCV 93.0 91.2 94.0 92.5  PLT 128* 92* 60* 24*   CBG (last 3)  Recent Labs    06/17/2018 2004  GLUCAP 76   Recent Results (from the past 240 hour(s))  Culture, blood (Routine x 2)     Status: None (Preliminary result)   Collection Time: 06/06/2018  5:28 PM  Result Value Ref Range Status   Specimen Description BLOOD PORTA CATH DRAWN BY RN  Final   Special Requests   Final    BOTTLES DRAWN AEROBIC AND ANAEROBIC Blood Culture adequate volume   Culture   Final    NO GROWTH 3 DAYS Performed at [facility], [address], [place], [place] [address]    Report Status PENDING  Incomplete  Culture, blood (Routine x 2)  Status: None (Preliminary result)   Collection Time: 06/03/2018  5:28 PM  Result Value Ref Range Status   Specimen Description BLOOD PORTA CATH DRAWN BY RN  Final   Special Requests   Final    BOTTLES DRAWN AEROBIC ONLY Blood Culture results may not be optimal due to an inadequate volume of blood received in culture bottles   Culture   Final    NO GROWTH 3 DAYS Performed at [facility], [address], [place], [place] [address]    Report Status PENDING  Incomplete  MRSA PCR Screening     Status: None   Collection Time: 05/24/18  2:04 AM  Result Value Ref Range Status   MRSA by PCR NEGATIVE NEGATIVE Final    Comment:        The GeneXpert MRSA Assay (FDA approved for NASAL specimens only), is one component of a comprehensive  MRSA colonization surveillance program. It is not intended to diagnose MRSA infection nor to guide or monitor treatment for MRSA infections. Performed at [facility], [address], [place], [place] [address]      Studies: Ct Angio Chest Pe W Or Wo Contrast  Result Date: 05/25/2018 CLINICAL DATA:  Suspect PE EXAM: CT ANGIOGRAPHY CHEST WITH CONTRAST TECHNIQUE: Multidetector CT imaging of the chest was performed using the standard protocol during bolus administration of intravenous contrast. Multiplanar CT image reconstructions and MIPs were obtained to evaluate the vascular anatomy. CONTRAST:  162m ISOVUE-370 IOPAMIDOL (ISOVUE-370) INJECTION 76% COMPARISON:  Chest radiograph, 05/29/2018, CT chest, 04/23/2018 FINDINGS: Cardiovascular: Satisfactory opacification of the pulmonary arteries to the segmental level. No evidence of pulmonary embolism. Normal heart size. No pericardial effusion. Mediastinum/Nodes: Unchanged right hilar and mediastinal soft tissue thickening. Thyroid gland, trachea, and esophagus demonstrate no significant findings. Lungs/Pleura: Substantial interval enlargement of a large cavitary lesion of the suprahilar right lung and right upper lobe which contains an air-fluid level demonstrates fistulization to the right mainstem bronchus (series 7, image 56). Underlying emphysema. There is extensive consolidative opacity of the right lung with diffuse interstitial opacity bilaterally, small bilateral pleural effusions, and associated dependent atelectasis or consolidation. Upper Abdomen: No acute abnormality. Musculoskeletal: No chest wall abnormality. No acute or significant osseous findings. Review of the MIP images confirms the above findings. IMPRESSION: 1.  Negative examination for pulmonary embolism. 2. Substantial interval enlargement of a large cavitary lesion of the suprahilar right lung and right upper lobe which contains an air-fluid level and demonstrates fistulization to the  right mainstem bronchus (series 7, image 56). 3. Underlying emphysema. There is extensive consolidative opacity of the right lung with diffuse interstitial opacity bilaterally, small bilateral pleural effusions, and associated dependent atelectasis or consolidation. Findings are substantially worsened compared to prior examination dated 04/23/2018 and multifocal infection, edema, and lymphangitic spread of malignancy are difficult to distinguish differential considerations. Electronically Signed   By: AEddie CandleM.D.   On: 05/25/2018 14:09   [place] Site Rite  Result Date: 05/25/2018 If Site Rite image not attached, placement could not be confirmed due to current cardiac rhythm.   Scheduled Meds: . chlorhexidine  15 mL Mouth Rinse BID  . chlorhexidine gluconate (MEDLINE KIT)  15 mL Mouth Rinse BID  . Chlorhexidine Gluconate Cloth  6 each Topical Daily  . enoxaparin (LOVENOX) injection  40 mg Subcutaneous Q24H  . feeding supplement  1 Container Oral TID BM  . fluconazole  100 mg Oral Daily  . ipratropium  0.5 mg Nebulization Q6H  . levalbuterol  0.63  mg Nebulization Q6H  . mouth rinse  15 mL Mouth Rinse q12n4p  . mouth rinse  15 mL Mouth Rinse 10 times per day  . methylPREDNISolone (SOLU-MEDROL) injection  80 mg Intravenous Q8H  . pantoprazole (PROTONIX) IV  40 mg Intravenous Daily  . sodium chloride flush  10-40 mL Intracatheter Q12H   Continuous Infusions: . norepinephrine (LEVOPHED) Adult infusion 8 mcg/min (05/26/18 0630)  . piperacillin-tazobactam (ZOSYN)  IV 12.5 mL/hr at 05/26/18 0833  . propofol (DIPRIVAN) infusion      Active Problems:   Anxiety and depression   Complex partial epilepsy (HCC)   Cancer of right lung ([place])   Primary lung cancer, right (HCC)   Non-small cell carcinoma of lung, right (HCC)   Intractable nausea and vomiting   HCAP (healthcare-associated pneumonia)   Sepsis with acute hypoxic respiratory failure without septic shock (HCC)   Severe malnutrition  (HCC)   Protein-calorie malnutrition, severe   Critical Care Time spent: 45 minutes. More than 50% of the time dedicated on face by face examination, coordination of care, initializing intubation and arranging transfer to higher level of care. Extensive time dedicated to discussed with family and patient at bedside regarding treatment needs, ongoing forward expectation and anticipated care. Patient remains critically ill, requiring to be intubated and mechanically ventilated. Will arrange transfer under critical care service at [facility].  Barton Dubois, MD [facility]  05/26/2018, 1:19 PM    LOS: 3 days

## 2018-05-26 NOTE — Progress Notes (Signed)
Pt arrived from [facility] via [place].  Pt placed on vent setting per report, will follow up with ABG. Pt tolerating well, RT will monitor

## 2018-05-26 NOTE — Procedures (Signed)
Bronchoscopy Procedure Note MONA AYARS [id number] 1965-12-29  Procedure: Bronchoscopy Indications: Obtain specimens for culture and/or other diagnostic studies  Procedure Details Consent: Risks of procedure as well as the alternatives and risks of each were explained to the (patient/caregiver).  Consent for procedure obtained. Time Out: Verified patient identification, verified procedure, site/side was marked, verified correct patient position, special equipment/implants available, medications/allergies/relevent history reviewed, required imaging and test results available.  Performed  In preparation for procedure, patient was given 100% FiO2 and bronchoscope lubricated. Sedation: propofol gtt + fent 100 mcg IV x 1  Airway entered and the following bronchi were examined: Bronchi.  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 Clear return on lavage  Procedures performed: BAL performed Bronchoscope removed.  , Patient placed back on 100% FiO2 at conclusion of procedure.    Evaluation Hemodynamic Status: BP stable throughout; O2 sats: stable throughout Patient's Current Condition: stable Specimens:  Sent purulent fluid BAL fro left for culture, afb, fungal, PCP & cytology Complications: No apparent complications Patient did tolerate procedure well.   Leanna Sato Elsworth Soho 05/26/2018

## 2018-05-26 NOTE — Procedures (Signed)
Bedside Bronchoscopy Procedure Note Sharon Berg [id number] 02/22/1966  Procedure: Bronchoscopy Indications: Diagnostic evaluation of the airways, Obtain specimens for culture and/or other diagnostic studies and Remove secretions  Procedure Details: ET Tube Size:7.0ET Tube secured at lip (cm):22 Bite block in place: No In preparation for procedure, Patient hyper-oxygenated with 100 % FiO2 and Saline given via ETT (60 ml) Airway entered and the following bronchi were examined: RUL, RML, RLL, LUL, LLL and Bronchi.   Bronchoscope removed.  , Patient placed back on 80% FiO2 at conclusion of procedure.    Evaluation BP (!) 93/53   Pulse (!) 105   Temp (!) 96.9 F (36.1 C) (Axillary)   Resp (!) 22   Ht 5\' 5"  (1.651 m)   Wt 61.2 kg   SpO2 98%   BMI 22.45 kg/m  Breath Sounds:Clear and Diminished O2 sats: stable throughout Patient's Current Condition: stable Specimens:  Sent  Complications: No apparent complications Patient did tolerate procedure well.   Ciro Backer 05/26/2018, 6:22 PM

## 2018-05-26 NOTE — Progress Notes (Signed)
Pharmacy Antibiotic Note  Sharon Berg is a 53 y.o. female admitted on 06/03/2018 with pneumonia.  Pharmacy has been consulted for Vancomycin and Cefepime dosing.  Plan: Zosyn 3.375g IV every 8 hours Monitor labs, c/s, and patient improvement.  Height: 5\' 5"  (165.1 cm) Weight: 133 lb 9.6 oz (60.6 kg) IBW/kg (Calculated) : 57  Temp (24hrs), Avg:98.3 F (36.8 C), Min:97.8 F (36.6 C), Max:98.8 F (37.1 C)  Recent Labs  Lab 06/11/2018 1728  05/24/18 0428 05/24/18 1018 05/24/18 1427 05/24/18 1907 05/24/18 2156 05/25/18 0445 05/26/18 0754  WBC 6.1  --  3.4*  --   --   --   --  8.5 12.2*  CREATININE 0.59  --  0.37*  --   --   --   --  <0.30* 0.33*  LATICACIDVEN 2.3*   < > 2.3* 2.6* 2.7* 2.1* 2.1*  --   --    < > = values in this interval not displayed.    Estimated Creatinine Clearance: 72.3 mL/min (A) (by C-G formula based on SCr of 0.33 mg/dL (L)).    Allergies  Allergen Reactions  . Levaquin [Levofloxacin] Other (See Comments)    Myalgia  . Codeine Nausea And Vomiting    Antimicrobials this admission: Vanco 2/3 >> 2/5 Cefepime 2/3 >> 2/5 Zosyn 2/5 >>  Dose adjustments this admission: N/A  Microbiology results: 2/3 BCx: ngtd 2/4 MRSA PCR: negative   Thank you for allowing pharmacy to be a part of this patient's care.  Ramond Craver 05/26/2018 9:15 AM

## 2018-05-26 NOTE — Anesthesia Procedure Notes (Signed)
Procedure Name: Intubation Performed by: Andree Elk, Amy A, CRNA Pre-anesthesia Checklist: Patient identified, Patient being monitored, Timeout performed, Emergency Drugs available and Suction available Patient Re-evaluated:Patient Re-evaluated prior to induction Oxygen Delivery Method: Circle system utilized Preoxygenation: Pre-oxygenation with 100% oxygen Induction Type: IV induction Ventilation: Mask ventilation without difficulty Laryngoscope Size: 3 and Glidescope Grade View: Grade I Tube type: Oral Tube size: 7.0 mm Number of attempts: 1 Airway Equipment and Method: Stylet and Video-laryngoscopy Placement Confirmation: ETT inserted through vocal cords under direct vision,  positive ETCO2 and breath sounds checked- equal and bilateral Secured at: 21 cm Tube secured with: Tape Dental Injury: Teeth and Oropharynx as per pre-operative assessment

## 2018-05-27 ENCOUNTER — Ambulatory Visit (HOSPITAL_COMMUNITY): Payer: 59

## 2018-05-27 ENCOUNTER — Inpatient Hospital Stay (HOSPITAL_COMMUNITY): Payer: 59

## 2018-05-27 LAB — BLOOD GAS, ARTERIAL
ACID-BASE EXCESS: 1.8 mmol/L (ref 0.0–2.0)
Bicarbonate: 27 mmol/L (ref 20.0–28.0)
DRAWN BY: 55062
FIO2: 80
MECHVT: 450 mL
O2 Saturation: 98.8 %
PEEP: 10 cmH2O
Patient temperature: 98.6
RATE: 22 resp/min
pCO2 arterial: 51.2 mmHg — ABNORMAL HIGH (ref 32.0–48.0)
pH, Arterial: 7.341 — ABNORMAL LOW (ref 7.350–7.450)
pO2, Arterial: 204 mmHg — ABNORMAL HIGH (ref 83.0–108.0)

## 2018-05-27 LAB — MAGNESIUM
Magnesium: 2 mg/dL (ref 1.7–2.4)
Magnesium: 1.9 mg/dL (ref 1.7–2.4)

## 2018-05-27 LAB — PHOSPHORUS
Phosphorus: 4.4 mg/dL (ref 2.5–4.6)
Phosphorus: 3.8 mg/dL (ref 2.5–4.6)

## 2018-05-27 LAB — BASIC METABOLIC PANEL
Anion gap: 8 (ref 5–15)
BUN: 21 mg/dL — ABNORMAL HIGH (ref 6–20)
CO2: 27 mmol/L (ref 22–32)
Calcium: 8.2 mg/dL — ABNORMAL LOW (ref 8.9–10.3)
Chloride: 118 mmol/L — ABNORMAL HIGH (ref 98–111)
Creatinine, Ser: 0.8 mg/dL (ref 0.44–1.00)
GFR calc non Af Amer: >60 mL/min (ref >60)
Glucose, Bld: 203 mg/dL — ABNORMAL HIGH (ref 70–99)
Potassium: 3.3 mmol/L — ABNORMAL LOW (ref 3.5–5.1)
Sodium: 153 mmol/L — ABNORMAL HIGH (ref 135–145)

## 2018-05-27 LAB — GLUCOSE, CAPILLARY
Glucose-Capillary: 112 mg/dL — ABNORMAL HIGH (ref 70–99)
Glucose-Capillary: 136 mg/dL — ABNORMAL HIGH (ref 70–99)
Glucose-Capillary: 139 mg/dL — ABNORMAL HIGH (ref 70–99)
Glucose-Capillary: 143 mg/dL — ABNORMAL HIGH (ref 70–99)
Glucose-Capillary: 157 mg/dL — ABNORMAL HIGH (ref 70–99)
Glucose-Capillary: 167 mg/dL — ABNORMAL HIGH (ref 70–99)

## 2018-05-27 LAB — CBC
HCT: 30.4 % — ABNORMAL LOW (ref 36.0–46.0)
Hemoglobin: 9.7 g/dL — ABNORMAL LOW (ref 12.0–15.0)
MCH: 29.9 pg (ref 26.0–34.0)
MCHC: 31.9 g/dL (ref 30.0–36.0)
MCV: 93.8 fL (ref 80.0–100.0)
Platelets: 18 10*3/uL — CL (ref 150–400)
RBC: 3.24 MIL/uL — ABNORMAL LOW (ref 3.87–5.11)
RDW: 19.2 % — ABNORMAL HIGH (ref 11.5–15.5)
WBC: 14 10*3/uL — AB (ref 4.0–10.5)
nRBC: 0.1 % (ref 0.0–0.2)

## 2018-05-27 MED ORDER — PRO-STAT SUGAR FREE PO LIQD
30.0000 mL | Freq: Two times a day (BID) | ORAL | Status: DC
  Administered 2018-05-27 – 2018-06-01 (×11): 30 mL
  Filled 2018-05-27 (×11): qty 30

## 2018-05-27 MED ORDER — METHYLPREDNISOLONE SODIUM SUCC 125 MG IJ SOLR
40.0000 mg | Freq: Two times a day (BID) | INTRAMUSCULAR | Status: DC
  Administered 2018-05-27 – 2018-06-03 (×14): 40 mg via INTRAVENOUS
  Filled 2018-05-27 (×14): qty 2

## 2018-05-27 MED ORDER — FUROSEMIDE 10 MG/ML IJ SOLN
20.0000 mg | Freq: Once | INTRAMUSCULAR | Status: DC
  Filled 2018-05-27: qty 2

## 2018-05-27 MED ORDER — POTASSIUM CHLORIDE 10 MEQ/50ML IV SOLN
10.0000 meq | INTRAVENOUS | Status: AC
  Administered 2018-05-27 (×4): 10 meq via INTRAVENOUS
  Filled 2018-05-27 (×4): qty 50

## 2018-05-27 MED ORDER — VITAL HIGH PROTEIN PO LIQD
1000.0000 mL | ORAL | Status: DC
  Administered 2018-05-27: 1000 mL

## 2018-05-27 MED ORDER — FUROSEMIDE 10 MG/ML IJ SOLN
20.0000 mg | Freq: Once | INTRAMUSCULAR | Status: AC
  Administered 2018-05-27: 20 mg via INTRAVENOUS
  Filled 2018-05-27: qty 2

## 2018-05-27 MED ORDER — VITAL AF 1.2 CAL PO LIQD
1000.0000 mL | ORAL | Status: DC
  Administered 2018-05-27 – 2018-05-29 (×3): 1000 mL
  Filled 2018-05-27 (×2): qty 1000

## 2018-05-27 MED ORDER — PANTOPRAZOLE SODIUM 40 MG PO PACK
40.0000 mg | PACK | Freq: Every day | ORAL | Status: DC
  Administered 2018-05-28 – 2018-06-01 (×5): 40 mg
  Filled 2018-05-27 (×8): qty 20

## 2018-05-27 MED ORDER — POTASSIUM CHLORIDE 10 MEQ/100ML IV SOLN
10.0000 meq | INTRAVENOUS | Status: DC
  Filled 2018-05-27: qty 100

## 2018-05-27 NOTE — Progress Notes (Addendum)
NAME:  Sharon Berg, MRN:  [id number], DOB:  1965/05/04, LOS: 4 ADMISSION DATE:  06/13/2018, CONSULTATION DATE:  05/26/2018 REFERRING MD:  Faylene Kurtz  CHIEF COMPLAINT:  Respiratory failure  Brief History   53 year old female, immunocompromised on chemo R lung adenocarcinoma IIIb, with sepsis due to HCAP transferred to [facility] from [facility] on 05/26/18 after she was intubated for acute respiratory failure.   Past Medical History  Right Lung adenocarcinoma IIIb, Migraine, Seizure disorder, Thrombocytopenia, Depression, Anxiety  Significant Hospital Events   2/3> admitted to [facility] 2/6> intubated, transferred to [facility]   Consults:  PCCM   Procedures:  2/6> endotracheal intubation   Significant Diagnostic Tests:  2/3 CXR> RUL opacities, RLL opacity. -- personally reviewed 2/6 2/5 CTA chest> no PE visualized on CTA. Increased cavitary lesion of right lung and right upper lobe, fistulization of R mainstem bronchus. Diffuse opacities throughout R lung. Small bilateral pleural effusions.  2/6 CXR> Significant increase in opacities, bilaterally. Interval ETT placement-- Personally reviewed 2/6   Micro Data:  BCx 2/3> no growth at 3 days MRSA 2/4> neg 2/7 BAL for Culture > , AFBsmear > , AFBCulture >, Fungus culture >,   Antimicrobials:  Vancomycin 2/3-2/5 Cefepime 2/3  Pip-tazo 2/4>> Diflucan 2/4>>   Interim history/subjective:  Vent support weaned down overnight and some more this morning.   Objective   Blood pressure 91/63, pulse 72, temperature 97.9 F (36.6 C), temperature source Oral, resp. rate (!) 22, height _0  (1.651 m), weight 63.6 kg, SpO2 98 %.    Vent Mode: PRVC FiO2 (%):  [50 %-100 %] 50 % Set Rate:  [18 bmp-22 bmp] 22 bmp Vt Set:  [450 mL] 450 mL PEEP:  [5 cmH20-10 cmH20] 10 cmH20 Plateau Pressure:  [20 cmH20-25 cmH20] 23 cmH20   Intake/Output Summary (Last 24 hours) at 05/27/2018 1115 Last data filed at 05/27/2018 3154 Gross per 24 hour  Intake 1760.56 ml    Output 655 ml  Net 1105.56 ml   Filed Weights   05/26/18 0645 05/26/18 1654 05/27/18 0451  Weight: 60.6 kg 61.2 kg 63.6 kg    Examination:  General: middle aged female on vent. Acutely ill appearing.  HENT:[place]/AT, PERRL, no appreciable JVD Lungs: Coarse bilateral breath sounds.  Cardiovascular: RRR, no MRG Abdomen: soft, round, non-distended, hypoactive x4 Extremities: No acute deformity. No edema  Neuro: Sedated. Does not open eyes to voice, is not following commands  GU: foley Skin: clean, dry, warm, intact   Resolved Hospital Problem list     Assessment & Plan:    Acute hypoxemic respiratory failure: multifactorial at this point. CT concerning for extensive consolidative process in the R lung concerning for HCAP in this immunocompromised patient. 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  - Full vent support -Titrate PEEP/FiO2 for SpO2 > 92% - Follow CXR, ABG - Continue steroids for now - Continue home atrovent and xenopex nebs  - KVO IVF  Sepsis PNA -BCx from 2/3 NGTD P - continue fluc/pip-tazo - Follow BAL -Trend WBC, UOP, Temperature - goal MAPs > 65, norepi for goal   Adenocarcinoma IIIb  - s/p 6x chemo -home carbolatin, paclitaxel  P - holding therapy at this time   Thrombocytopenia plt 24  P -Follow CBC, transfuse plt if <10 without bleeding or <50 with active bleeding -no chemical VTE ppx at this time (SCDs only for now)  Electrolyte  Abnormalities Hypernatremia -likely hypovolemic hypernatremia given history P -KVO IVF -Monitor BMP, mag phos -Replace PRN   Malnutrition NPO x several days - Start TF  Hyperglycemia, at risk - Critical illness, systemic steroids - Will be starting EN  - SSI ordered   Best practice:  Diet: TF Pain/Anxiety/Delirium protocol (if indicated): Propofol, fentanyl VAP protocol (if indicated): Yes  DVT prophylaxis: SCDs GI  prophylaxis: Protonix Glucose control: Monitor CBG, SSI as needed  Mobility: bedrest Code Status: Full  Family Communication:Daughter updated 2/7. Disposition: ICU   Labs   CBC: Recent Labs  Lab 06/15/2018 1728 05/24/18 0428 05/25/18 0445 05/26/18 0754 05/26/18 1715 05/27/18 0448  WBC 6.1 3.4* 8.5 12.2*  --  14.0*  NEUTROABS 4.8  --  8.0*  --   --   --   HGB 11.0* 9.7* 10.4* 10.0* 9.5* 9.7*  HCT 34.3* 30.2* 32.9* 32.0* 28.0* 30.4*  MCV 93.0 91.2 94.0 92.5  --  93.8  PLT 128* 92* 60* 24*  --  18*    Basic Metabolic Panel: Recent Labs  Lab 06/14/2018 1728 05/24/18 0428 05/25/18 0445 05/26/18 0754 05/26/18 1715 05/27/18 0448  NA 135 138 146* 153* 152* 153*  K 3.3* 3.5 3.6 3.3* 3.6 3.3*  CL 103 109 119* 119*  --  118*  CO2 _0 --  27  GLUCOSE 56* 162* 154* 114*  --  203*  BUN 31* 22* 14 14  --  21*  CREATININE 0.59 0.37* <0.30* 0.33*  --  0.80  CALCIUM 7.6* 7.6* 8.1* 8.7*  --  8.2*  MG 2.4  --  1.7 1.8  --  2.0  PHOS  --   --   --  3.8  --  4.4   GFR: Estimated Creatinine Clearance: 72.3 mL/min (by C-G formula based on SCr of 0.8 mg/dL). Recent Labs  Lab 05/24/18 0428 05/24/18 1018 05/24/18 1020 05/24/18 1427 05/24/18 1907 05/24/18 2156 05/25/18 0445 05/26/18 0754 05/27/18 0448  PROCALCITON  --   --  4.03  --   --   --   --   --   --   WBC 3.4*  --   --   --   --   --  8.5 12.2* 14.0*  LATICACIDVEN 2.3* 2.6*  --  2.7* 2.1* 2.1*  --   --   --     Liver Function Tests: Recent Labs  Lab 06/06/2018 1728 05/25/18 0445  AST 50* 56*  ALT 39 50*  ALKPHOS 63 76  BILITOT 0.8 0.5  PROT 4.7* 3.9*  ALBUMIN 1.9* 1.4*   No results for input(s): LIPASE, AMYLASE in the last 168 hours. No results for input(s): AMMONIA in the last 168 hours.  ABG    Component Value Date/Time   PHART 7.341 (L) 05/27/2018 0359   PCO2ART 51.2 (H) 05/27/2018 0359   PO2ART 204 (H) 05/27/2018 0359   HCO3 27.0 05/27/2018 0359   TCO2 32 05/26/2018 1715   ACIDBASEDEF 1.3  05/25/2018 1358   O2SAT 98.8 05/27/2018 0359     Coagulation Profile: Recent Labs  Lab 06/09/2018 1728  INR 1.15    Cardiac Enzymes: No results for input(s): CKTOTAL, CKMB, CKMBINDEX, TROPONINI in the last 168 hours.  HbA1C: Hemoglobin A1C  Date/Time Value Ref Range Status  10/30/2015 03:20 PM 5.2  Final   Hgb A1c MFr Bld  Date/Time Value Ref Range Status  04/23/2018 09:02 AM 5.5 4.8 - 5.6 % Final    Comment:    (  NOTE) Pre diabetes:          5.7%-6.4% Diabetes:              >6.4% Glycemic control for   <7.0% adults with diabetes     CBG: Recent Labs  Lab 05/26/18 1657 05/26/18 1931 05/27/18 0018 05/27/18 0431 05/27/18 0731  GLUCAP 137* 140* 157* 167* 139*    Critical care time 35 mins.    Georgann Housekeeper, AGACNP-BC [place] Pager [phone] or [phone]  05/27/2018 11:54 AM

## 2018-05-27 NOTE — Progress Notes (Signed)
eLink Physician-Brief Progress Note Patient Name: Sharon Berg DOB: Oct 28, 1965 MRN: [id number]   Date of Service  05/27/2018  HPI/Events of Note  Low urine output  eICU Interventions  Lasix 20 mg iv x 1        Jaleeyah Munce U Aden Youngman 05/27/2018, 6:47 AM

## 2018-05-27 NOTE — Progress Notes (Signed)
Nutrition Follow-up / Consult  DOCUMENTATION CODES:   Severe malnutrition in context of chronic illness  INTERVENTION:    Vital AF 1.2 at 35 ml/h (840 ml per day)  Pro-stat 30 ml BID  Provides 1208 kcal (1496 kcal total with Propofol), 93 gm protein, 681 ml free water daily  NUTRITION DIAGNOSIS:   Severe Malnutrition(Chronic) related to cancer and cancer related treatments, nausea as evidenced by energy intake < or equal to 75% for > or equal to 1 month, percent weight loss.  Ongoing  GOAL:   Patient will meet greater than or equal to 90% of their needs  Being addressed with TF  MONITOR:   Vent status, TF tolerance, Skin, Labs, I & O's  REASON FOR ASSESSMENT:   Ventilator, Consult Enteral/tube feeding initiation and management  ASSESSMENT:   53 y/o female PMHx Anxiety, depression, significant tobacco abuse (>55 pack yr), and active lung cancer (dx Nov 2019) for which she has been receiving chemoradiation. Presented to ED from OP oncology appt d/t hypoxia, tachycardia and hypotension. CXR showed multilobar PNA. Admitted for sepsis 2/2 HCAP.   Received MD Consult for TF initiation and management.  Patient is currently intubated on ventilator support MV: 10.2 L/min Temp (24hrs), Avg:98.1 F (36.7 C), Min:97.7 F (36.5 C), Max:98.6 F (37 C)  Propofol at 10.9 ml/h providing 288 kcal from lipids  Labs reviewed. Sodium 153 (H), potassium 3.3 (L) CBG's: [phone] Medications reviewed and include novolog, solumedrol, KCl, propofol.    NUTRITION - FOCUSED PHYSICAL EXAM:    Most Recent Value  Orbital Region  Moderate depletion  Upper Arm Region  No depletion  Thoracic and Lumbar Region  Mild depletion  Buccal Region  Unable to assess  Temple Region  Severe depletion  Clavicle Bone Region  Mild depletion  Clavicle and Acromion Bone Region  Moderate depletion  Scapular Bone Region  Unable to assess  Dorsal Hand  No depletion  Patellar Region  No depletion   Anterior Thigh Region  No depletion  Posterior Calf Region  Mild depletion  Edema (RD Assessment)  Mild  Hair  Reviewed  Eyes  Unable to assess  Mouth  Unable to assess  Skin  Reviewed  Nails  Reviewed       Diet Order:   Diet Order            Diet NPO time specified  Diet effective now              EDUCATION NEEDS:   Education needs have been addressed  Skin:  Skin Assessment: Reviewed RN Assessment  Last BM:  2/4  Height:   Ht Readings from Last 1 Encounters:  05/26/18 5\' 5"  (1.651 m)    Weight:   Wt Readings from Last 1 Encounters:  05/27/18 63.6 kg   Admission weight 57 kg (2/4)  Ideal Body Weight:  56.82 kg  BMI:  Body mass index is 23.33 kg/m.  Estimated Nutritional Needs:   Kcal:  1410  Protein:  85-95 gm  Fluid:  >/= 1.5 L    Molli Barrows, RD, LDN, [place] Pager [phone] After Hours Pager [phone]

## 2018-05-27 NOTE — Care Management Note (Signed)
Case Management Note  Patient Details  Name: Sharon Berg MRN: [id number] Date of Birth: 03-21-1966  Subjective/Objective:   Pt admitted with resp failure                   Action/Plan: PTA independent from home, recent dx of lung CA.  Palliative care consulted   Expected Discharge Date:                  Expected Discharge Plan:  (Independent from home)  In-House Referral:  Clinical Social Work  Discharge planning Services  CM Consult  Post Acute Care Choice:    Choice offered to:     DME Arranged:    DME Agency:     HH Arranged:    [place] Agency:     Status of Service:  In process, will continue to follow  If discussed at Long Length of Stay Meetings, dates discussed:    Additional Comments:  Maryclare Labrador, RN 05/27/2018, 3:35 PM

## 2018-05-28 LAB — GLUCOSE, CAPILLARY
GLUCOSE-CAPILLARY: 200 mg/dL — AB (ref 70–99)
Glucose-Capillary: 157 mg/dL — ABNORMAL HIGH (ref 70–99)
Glucose-Capillary: 162 mg/dL — ABNORMAL HIGH (ref 70–99)
Glucose-Capillary: 180 mg/dL — ABNORMAL HIGH (ref 70–99)
Glucose-Capillary: 184 mg/dL — ABNORMAL HIGH (ref 70–99)
Glucose-Capillary: 214 mg/dL — ABNORMAL HIGH (ref 70–99)
Glucose-Capillary: 218 mg/dL — ABNORMAL HIGH (ref 70–99)

## 2018-05-28 LAB — POCT I-STAT 7, (LYTES, BLD GAS, ICA,H+H)
Acid-Base Excess: 7 mmol/L — ABNORMAL HIGH (ref 0.0–2.0)
Bicarbonate: 31.8 mmol/L — ABNORMAL HIGH (ref 20.0–28.0)
Calcium, Ion: 1.26 mmol/L (ref 1.15–1.40)
HCT: 27 % — ABNORMAL LOW (ref 36.0–46.0)
Hemoglobin: 9.2 g/dL — ABNORMAL LOW (ref 12.0–15.0)
O2 Saturation: 95 %
Patient temperature: 98.6
Potassium: 3.7 mmol/L (ref 3.5–5.1)
Sodium: 151 mmol/L — ABNORMAL HIGH (ref 135–145)
TCO2: 33 mmol/L — ABNORMAL HIGH (ref 22–32)
pCO2 arterial: 47.8 mmHg (ref 32.0–48.0)
pH, Arterial: 7.43 (ref 7.350–7.450)
pO2, Arterial: 77 mmHg — ABNORMAL LOW (ref 83.0–108.0)

## 2018-05-28 LAB — COMPREHENSIVE METABOLIC PANEL
ALT: 72 U/L — AB (ref 0–44)
AST: 82 U/L — AB (ref 15–41)
Albumin: 1.3 g/dL — ABNORMAL LOW (ref 3.5–5.0)
Alkaline Phosphatase: 115 U/L (ref 38–126)
Anion gap: 6 (ref 5–15)
BUN: 35 mg/dL — AB (ref 6–20)
CO2: 31 mmol/L (ref 22–32)
CREATININE: 0.48 mg/dL (ref 0.44–1.00)
Calcium: 8 mg/dL — ABNORMAL LOW (ref 8.9–10.3)
Chloride: 117 mmol/L — ABNORMAL HIGH (ref 98–111)
GFR calc Af Amer: >60 mL/min (ref >60)
Glucose, Bld: 185 mg/dL — ABNORMAL HIGH (ref 70–99)
Potassium: 3.7 mmol/L (ref 3.5–5.1)
Sodium: 154 mmol/L — ABNORMAL HIGH (ref 135–145)
Total Bilirubin: 0.9 mg/dL (ref 0.3–1.2)
Total Protein: 3.6 g/dL — ABNORMAL LOW (ref 6.5–8.1)

## 2018-05-28 LAB — PHOSPHORUS
Phosphorus: 3.3 mg/dL (ref 2.5–4.6)
Phosphorus: 2.7 mg/dL (ref 2.5–4.6)

## 2018-05-28 LAB — MAGNESIUM
Magnesium: 2 mg/dL (ref 1.7–2.4)
Magnesium: 1.9 mg/dL (ref 1.7–2.4)

## 2018-05-28 LAB — CBC
HCT: 30.5 % — ABNORMAL LOW (ref 36.0–46.0)
Hemoglobin: 9.6 g/dL — ABNORMAL LOW (ref 12.0–15.0)
MCH: 29.3 pg (ref 26.0–34.0)
MCHC: 31.5 g/dL (ref 30.0–36.0)
MCV: 93 fL (ref 80.0–100.0)
Platelets: 14 10*3/uL — CL (ref 150–400)
RBC: 3.28 MIL/uL — ABNORMAL LOW (ref 3.87–5.11)
RDW: 18.7 % — AB (ref 11.5–15.5)
WBC: 10.9 10*3/uL — ABNORMAL HIGH (ref 4.0–10.5)
nRBC: 0 % (ref 0.0–0.2)

## 2018-05-28 LAB — CULTURE, BLOOD (ROUTINE X 2)
Culture: NO GROWTH
Culture: NO GROWTH
Special Requests: ADEQUATE

## 2018-05-28 LAB — ACID FAST SMEAR (AFB, MYCOBACTERIA): Acid Fast Smear: NEGATIVE

## 2018-05-28 MED ORDER — FREE WATER: 200.0000 mL | Freq: Three times a day (TID) | Status: DC

## 2018-05-28 MED ORDER — FUROSEMIDE 10 MG/ML IJ SOLN
40.0000 mg | Freq: Two times a day (BID) | INTRAMUSCULAR | Status: DC
  Administered 2018-05-28 – 2018-06-03 (×13): 40 mg via INTRAVENOUS
  Filled 2018-05-28 (×13): qty 4

## 2018-05-28 MED ORDER — FREE WATER
200.0000 mL | Freq: Four times a day (QID) | Status: DC
  Administered 2018-05-28 – 2018-06-01 (×15): 200 mL

## 2018-05-28 NOTE — Progress Notes (Signed)
Pharmacy Antibiotic Note  Sharon Berg is a 53 y.o. female admitted on 06/07/2018 with pneumonia. Pharmacy managing Zosyn dosing. WBC improved. Remains afebrile.   Plan: Zosyn 3.375g IV every 8 hours Monitor labs, c/s, and patient improvement.  Height: 5\' 5"  (165.1 cm) Weight: 139 lb 1.8 oz (63.1 kg) IBW/kg (Calculated) : 57  Temp (24hrs), Avg:97.8 F (36.6 C), Min:96.7 F (35.9 C), Max:98.3 F (36.8 C)  Recent Labs  Lab 05/24/18 0428 05/24/18 1018 05/24/18 1427 05/24/18 1907 05/24/18 2156 05/25/18 0445 05/26/18 0754 05/27/18 0448 05/28/18 0414  WBC 3.4*  --   --   --   --  8.5 12.2* 14.0* 10.9*  CREATININE 0.37*  --   --   --   --  <0.30* 0.33* 0.80 0.48  LATICACIDVEN 2.3* 2.6* 2.7* 2.1* 2.1*  --   --   --   --     Estimated Creatinine Clearance: 72.3 mL/min (by C-G formula based on SCr of 0.48 mg/dL).    Allergies  Allergen Reactions  . Levaquin [Levofloxacin] Other (See Comments)    Myalgia  . Codeine Nausea And Vomiting    Antimicrobials this admission: Vanco 2/3 >> 2/5 Cefepime 2/3 >> 2/5 Zosyn 2/5 >>  Dose adjustments this admission: N/A  Microbiology results: 2/3 BCx: ngtd 2/4 MRSA PCR: negative   Thank you for allowing pharmacy to be a part of this patient's care.  Albertina Parr, PharmD., BCPS Clinical Pharmacist Clinical phone for 05/28/18 until 8:30pm: [phone] If after 8:30pm, please refer to [facility] for unit-specific pharmacist

## 2018-05-28 NOTE — Progress Notes (Signed)
Per verbal order of Dr. Vaughan Browner.

## 2018-05-28 NOTE — Progress Notes (Signed)
NAME:  Sharon Berg, MRN:  [id number], DOB:  [birth year], LOS: 5 ADMISSION DATE:  05/22/2018, CONSULTATION DATE:  05/26/2018 REFERRING MD:  Faylene Kurtz  CHIEF COMPLAINT:  Respiratory failure  Brief History   53 year old female, immunocompromised on chemo R lung adenocarcinoma IIIb, with sepsis due to HCAP transferred to [facility] from [place] on 05/26/18 after she was intubated for acute respiratory failure.   Past Medical History  Right Lung adenocarcinoma IIIb, Migraine, Seizure disorder, Thrombocytopenia, Depression, Anxiety  Significant Hospital Events   2/3> admitted to [facility] 2/6> intubated, transferred to [facility]   Consults:  PCCM   Procedures:  2/6> endotracheal intubation, bronch with BAL  Significant Diagnostic Tests:  2/3 CXR> RUL opacities, RLL opacity. -- personally reviewed 2/6 2/5 CTA chest> no PE visualized on CTA. Increased cavitary lesion of right lung and right upper lobe, fistulization of R mainstem bronchus. Diffuse opacities throughout R lung. Small bilateral pleural effusions.   Micro Data:  BCx 2/3> no growth at 3 days MRSA 2/4> neg 2/7 BAL for Culture > , AFBsmear > , AFBCulture >, Fungus culture >,   Antimicrobials:  Vancomycin 2/3-2/5 Cefepime 2/3  Pip-tazo 2/4>> Diflucan 2/4>>   Interim history/subjective:  Continues on vent  Objective   Blood pressure 113/85, pulse 64, temperature (!) 96.7 F (35.9 C), temperature source Axillary, resp. rate (!) 22, height _0  (1.651 m), weight 63.1 kg, SpO2 98 %.    Vent Mode: PRVC FiO2 (%):  [50 %] 50 % Set Rate:  [22 bmp] 22 bmp Vt Set:  [450 mL] 450 mL PEEP:  [10 cmH20] 10 cmH20 Plateau Pressure:  [18 cmH20-23 cmH20] 20 cmH20   Intake/Output Summary (Last 24 hours) at 05/28/2018 1036 Last data filed at 05/28/2018 0800 Gross per 24 hour  Intake 1698.01 ml  Output 965 ml  Net 733.01 ml   Filed Weights   05/26/18 1654 05/27/18 0451 05/28/18 0441  Weight: 61.2 kg 63.6 kg 63.1 kg    Examination:  Gen:        Chronically ill-appearing. HEENT:  EOMI, sclera anicteric, ET tube Neck:     No masses; no thyromegaly Lungs:    Bilateral scattered rhonchi. CV:         Regular rate and rhythm; no murmurs Abd:      + bowel sounds; soft, non-tender; no palpable masses, no distension Ext:    No edema; adequate peripheral perfusion Skin:      Warm and dry; no rash Neuro: Sedated, Unresponsive.  Resolved Hospital Problem list     Assessment & Plan:  Acute hypoxemic respiratory failure: multifactorial at this point. CT concerning for extensive consolidative process in the R lung concerning for HCAP in this immunocompromised patient. 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  P: Continue vent support Reduce PEEP.  No pressure support weans until PEEP is at 5 Continue steroids, nebulizers Follow chest x-ray Follow cultures and cytology on BAL. Lasix for diuresis as she appears volume overloaded.  Sepsis PNA -BCx from 2/3 NGTD P Continue Zosyn  Adenocarcinoma IIIb  - s/p 6x chemo -home carbolatin, paclitaxel  P Holding therapy  Thrombocytopenia plt 24  P -Follow CBC, transfuse plt if <10 without bleeding or <50 with active bleeding -no chemical VTE ppx at this time (SCDs only for now)  Electrolyte Abnormalities Hypernatremia -likely hypovolemic hypernatremia given history P Free water  Malnutrition NPO x  several days Tube feeds  Hyperglycemia, at risk - Critical illness, systemic steroids - SSI  Best practice:  Diet: TF Pain/Anxiety/Delirium protocol (if indicated): Propofol, fentanyl VAP protocol (if indicated): Yes  DVT prophylaxis: SCDs GI prophylaxis: Protonix Glucose control: Monitor CBG, SSI as needed  Mobility: bedrest Code Status: Full  Family Communication:Daughter updated 2/7.  Mother updated 2/8 Disposition: ICU   Labs   CBC: Recent Labs  Lab 06/06/2018 1728 05/24/18 0428  05/25/18 0445 05/26/18 0754 05/26/18 1715 05/27/18 0448 05/28/18 0414 05/28/18 0430  WBC 6.1 3.4* 8.5 12.2*  --  14.0* 10.9*  --   NEUTROABS 4.8  --  8.0*  --   --   --   --   --   HGB 11.0* 9.7* 10.4* 10.0* 9.5* 9.7* 9.6* 9.2*  HCT 34.3* 30.2* 32.9* 32.0* 28.0* 30.4* 30.5* 27.0*  MCV 93.0 91.2 94.0 92.5  --  93.8 93.0  --   PLT 128* 92* 60* 24*  --  18* 14*  --     Basic Metabolic Panel: Recent Labs  Lab 05/24/18 0428 05/25/18 0445 05/26/18 0754 05/26/18 1715 05/27/18 0448 05/27/18 1800 05/28/18 0414 05/28/18 0430  NA 138 146* 153* 152* 153*  --  154* 151*  K 3.5 3.6 3.3* 3.6 3.3*  --  3.7 3.7  CL 109 119* 119*  --  118*  --  117*  --   CO2 _0 --  27  --  31  --   GLUCOSE 162* 154* 114*  --  203*  --  185*  --   BUN 22* 14 14  --  21*  --  35*  --   CREATININE 0.37* <0.30* 0.33*  --  0.80  --  0.48  --   CALCIUM 7.6* 8.1* 8.7*  --  8.2*  --  8.0*  --   MG  --  1.7 1.8  --  2.0 1.9 2.0  --   PHOS  --   --  3.8  --  4.4 3.8 3.3  --    GFR: Estimated Creatinine Clearance: 72.3 mL/min (by C-G formula based on SCr of 0.48 mg/dL). Recent Labs  Lab 05/24/18 1018 05/24/18 1020 05/24/18 1427 05/24/18 1907 05/24/18 2156 05/25/18 0445 05/26/18 0754 05/27/18 0448 05/28/18 0414  PROCALCITON  --  4.03  --   --   --   --   --   --   --   WBC  --   --   --   --   --  8.5 12.2* 14.0* 10.9*  LATICACIDVEN 2.6*  --  2.7* 2.1* 2.1*  --   --   --   --     Liver Function Tests: Recent Labs  Lab 06/14/2018 1728 05/25/18 0445 05/28/18 0414  AST 50* 56* 82*  ALT 39 50* 72*  ALKPHOS 63 76 115  BILITOT 0.8 0.5 0.9  PROT 4.7* 3.9* 3.6*  ALBUMIN 1.9* 1.4* 1.3*   No results for input(s): LIPASE, AMYLASE in the last 168 hours. No results for input(s): AMMONIA in the last 168 hours.  ABG    Component Value Date/Time   PHART 7.430 05/28/2018 0430   PCO2ART 47.8 05/28/2018 0430   PO2ART 77.0 (L) 05/28/2018 0430   HCO3 31.8 (H) 05/28/2018 0430   TCO2 33 (H) 05/28/2018  0430   ACIDBASEDEF 1.3 05/25/2018 1358   O2SAT 95.0 05/28/2018 0430     Coagulation Profile: Recent Labs  Lab 05/28/2018 1728  INR 1.15  Cardiac Enzymes: No results for input(s): CKTOTAL, CKMB, CKMBINDEX, TROPONINI in the last 168 hours.  HbA1C: Hemoglobin A1C  Date/Time Value Ref Range Status  10/30/2015 03:20 PM 5.2  Final   Hgb A1c MFr Bld  Date/Time Value Ref Range Status  04/23/2018 09:02 AM 5.5 4.8 - 5.6 % Final    Comment:    (NOTE) Pre diabetes:          5.7%-6.4% Diabetes:              >6.4% Glycemic control for   <7.0% adults with diabetes     CBG: Recent Labs  Lab 05/27/18 1531 05/27/18 2012 05/28/18 0002 05/28/18 0407 05/28/18 0717  GLUCAP 112* 143* 157* 162* 200*   The patient is critically ill with multiple organ system failure and requires high complexity decision making for assessment and support, frequent evaluation and titration of therapies, advanced monitoring, review of radiographic studies and interpretation of complex data.   Critical Care Time devoted to patient care services, exclusive of separately billable procedures, described in this note is 35 minutes.   Marshell Garfinkel MD  Pulmonary and Critical Care Pager [phone] If no answer call 336 [phone] 05/28/2018, 10:43 AM

## 2018-05-29 LAB — CBC WITH DIFFERENTIAL/PLATELET
Abs Immature Granulocytes: 0.29 10*3/uL — ABNORMAL HIGH (ref 0.00–0.07)
Basophils Absolute: 0 10*3/uL (ref 0.0–0.1)
Basophils Relative: 0 %
EOS PCT: 0 %
Eosinophils Absolute: 0 10*3/uL (ref 0.0–0.5)
HCT: 30 % — ABNORMAL LOW (ref 36.0–46.0)
Hemoglobin: 9.8 g/dL — ABNORMAL LOW (ref 12.0–15.0)
Immature Granulocytes: 3 %
Lymphocytes Relative: 2 %
Lymphs Abs: 0.2 10*3/uL — ABNORMAL LOW (ref 0.7–4.0)
MCH: 30.2 pg (ref 26.0–34.0)
MCHC: 32.7 g/dL (ref 30.0–36.0)
MCV: 92.6 fL (ref 80.0–100.0)
Monocytes Absolute: 0.1 10*3/uL (ref 0.1–1.0)
Monocytes Relative: 1 %
Neutro Abs: 10.1 10*3/uL — ABNORMAL HIGH (ref 1.7–7.7)
Neutrophils Relative %: 94 %
Platelets: 21 10*3/uL — CL (ref 150–400)
RBC: 3.24 MIL/uL — ABNORMAL LOW (ref 3.87–5.11)
RDW: 18.6 % — ABNORMAL HIGH (ref 11.5–15.5)
WBC: 10.8 10*3/uL — ABNORMAL HIGH (ref 4.0–10.5)
nRBC: 0.3 % — ABNORMAL HIGH (ref 0.0–0.2)

## 2018-05-29 LAB — GLUCOSE, CAPILLARY
GLUCOSE-CAPILLARY: 194 mg/dL — AB (ref 70–99)
GLUCOSE-CAPILLARY: 192 mg/dL — AB (ref 70–99)
Glucose-Capillary: 153 mg/dL — ABNORMAL HIGH (ref 70–99)
Glucose-Capillary: 170 mg/dL — ABNORMAL HIGH (ref 70–99)
Glucose-Capillary: 186 mg/dL — ABNORMAL HIGH (ref 70–99)
Glucose-Capillary: 216 mg/dL — ABNORMAL HIGH (ref 70–99)

## 2018-05-29 LAB — COMPREHENSIVE METABOLIC PANEL
ALK PHOS: 155 U/L — AB (ref 38–126)
ALT: 123 U/L — ABNORMAL HIGH (ref 0–44)
AST: 123 U/L — ABNORMAL HIGH (ref 15–41)
Albumin: 1.4 g/dL — ABNORMAL LOW (ref 3.5–5.0)
Anion gap: 10 (ref 5–15)
BILIRUBIN TOTAL: 1.3 mg/dL — AB (ref 0.3–1.2)
BUN: 37 mg/dL — ABNORMAL HIGH (ref 6–20)
CO2: 36 mmol/L — ABNORMAL HIGH (ref 22–32)
Calcium: 7.8 mg/dL — ABNORMAL LOW (ref 8.9–10.3)
Chloride: 107 mmol/L (ref 98–111)
Creatinine, Ser: 0.35 mg/dL — ABNORMAL LOW (ref 0.44–1.00)
GFR calc Af Amer: >60 mL/min (ref >60)
Glucose, Bld: 213 mg/dL — ABNORMAL HIGH (ref 70–99)
Potassium: 3.5 mmol/L (ref 3.5–5.1)
Sodium: 153 mmol/L — ABNORMAL HIGH (ref 135–145)
Total Protein: 3.4 g/dL — ABNORMAL LOW (ref 6.5–8.1)

## 2018-05-29 LAB — CULTURE, BAL-QUANTITATIVE W GRAM STAIN: Culture: NO GROWTH

## 2018-05-29 LAB — PHOSPHORUS: Phosphorus: 3.4 mg/dL (ref 2.5–4.6)

## 2018-05-29 LAB — TRIGLYCERIDES: Triglycerides: 672 mg/dL — ABNORMAL HIGH (ref <150)

## 2018-05-29 LAB — MAGNESIUM: Magnesium: 1.9 mg/dL (ref 1.7–2.4)

## 2018-05-29 MED ORDER — MIDAZOLAM HCL 2 MG/2ML IJ SOLN
2.0000 mg | INTRAMUSCULAR | Status: AC | PRN
  Administered 2018-05-31 (×3): 2 mg via INTRAVENOUS
  Filled 2018-05-29 (×4): qty 2

## 2018-05-29 MED ORDER — FENTANYL CITRATE (PF) 100 MCG/2ML IJ SOLN: 50.0000 ug | Freq: Once | INTRAMUSCULAR | Status: AC

## 2018-05-29 MED ORDER — WHITE PETROLATUM EX OINT
TOPICAL_OINTMENT | CUTANEOUS | Status: AC
  Administered 2018-05-29: 0.2
  Filled 2018-05-29: qty 28.35

## 2018-05-29 MED ORDER — FENTANYL BOLUS VIA INFUSION
50.0000 ug | INTRAVENOUS | Status: DC | PRN
  Administered 2018-05-29 – 2018-05-31 (×8): 50 ug via INTRAVENOUS
  Filled 2018-05-29: qty 50

## 2018-05-29 MED ORDER — FENTANYL 2500MCG IN NS 250ML (10MCG/ML) PREMIX INFUSION
25.0000 ug/h | INTRAVENOUS | Status: DC
  Administered 2018-05-29: 25 ug/h via INTRAVENOUS
  Administered 2018-05-30: 250 ug/h via INTRAVENOUS
  Administered 2018-05-30: 225 ug/h via INTRAVENOUS
  Administered 2018-05-30: 250 ug/h via INTRAVENOUS
  Administered 2018-05-31 – 2018-06-01 (×4): 300 ug/h via INTRAVENOUS
  Filled 2018-05-29 (×7): qty 250

## 2018-05-29 MED ORDER — MIDAZOLAM HCL 2 MG/2ML IJ SOLN
2.0000 mg | INTRAMUSCULAR | Status: DC | PRN
  Administered 2018-05-29 – 2018-06-01 (×6): 2 mg via INTRAVENOUS
  Filled 2018-05-29 (×5): qty 2

## 2018-05-29 MED ORDER — LEVALBUTEROL HCL 0.63 MG/3ML IN NEBU
0.6300 mg | INHALATION_SOLUTION | Freq: Two times a day (BID) | RESPIRATORY_TRACT | Status: DC
  Administered 2018-05-30 – 2018-06-04 (×11): 0.63 mg via RESPIRATORY_TRACT
  Filled 2018-05-29 (×12): qty 3

## 2018-05-29 MED ORDER — IPRATROPIUM BROMIDE 0.02 % IN SOLN
0.5000 mg | Freq: Two times a day (BID) | RESPIRATORY_TRACT | Status: DC
  Administered 2018-05-30 – 2018-06-04 (×11): 0.5 mg via RESPIRATORY_TRACT
  Filled 2018-05-29 (×12): qty 2.5

## 2018-05-29 MED ORDER — DEXTROSE 5 % IV SOLN
INTRAVENOUS | Status: DC
  Administered 2018-05-29 – 2018-06-01 (×6): via INTRAVENOUS

## 2018-05-29 NOTE — Progress Notes (Addendum)
NAME:  Sharon Berg, MRN:  [id number], DOB:  1966-03-23, LOS: 6 ADMISSION DATE:  06/07/2018, CONSULTATION DATE:  05/26/2018 REFERRING MD:  Faylene Kurtz  CHIEF COMPLAINT:  Respiratory failure  Brief History   53 year old female, immunocompromised on chemo R lung adenocarcinoma IIIb, with sepsis due to HCAP transferred to [facility] from [place] on 05/26/18 after she was intubated for acute respiratory failure.   Past Medical History  Right Lung adenocarcinoma IIIb, Migraine, Seizure disorder, Thrombocytopenia, Depression, Anxiety  Significant Hospital Events   2/3> admitted to [facility] 2/6> intubated, transferred to [facility]   Consults:  PCCM   Procedures:  2/6> endotracheal intubation, bronch with BAL  Significant Diagnostic Tests:  2/3 CXR> RUL opacities, RLL opacity. -- personally reviewed 2/6 2/5 CTA chest> no PE visualized on CTA. Increased cavitary lesion of right lung and right upper lobe, fistulization of R mainstem bronchus. Diffuse opacities throughout R lung. Small bilateral pleural effusions.   Micro Data:  BCx 2/3> no growth at 3 days MRSA 2/4> neg 2/7 BAL for Culture > , AFBsmear > , AFBCulture >, Fungus culture >,   Antimicrobials:  Vancomycin 2/3-2/5 Cefepime 2/3  Pip-tazo 2/4>> Diflucan 2/4>>   Interim history/subjective:  Continues on the vent, no acute events overnight  Objective   Blood pressure 137/84, pulse (!) 112, temperature (!) 97.3 F (36.3 C), temperature source Axillary, resp. rate (!) 23, height _0  (1.651 m), weight 61.5 kg, SpO2 90 %.    Vent Mode: PSV;CPAP FiO2 (%):  [40 %-50 %] 40 % Set Rate:  [22 bmp] 22 bmp Vt Set:  [450 mL] 450 mL PEEP:  [8 cmH20] 8 cmH20 Pressure Support:  [10 cmH20] 10 cmH20 Plateau Pressure:  [20 cmH20-22 cmH20] 22 cmH20   Intake/Output Summary (Last 24 hours) at 05/29/2018 1105 Last data filed at 05/29/2018 1000 Gross per 24 hour  Intake 2380.7 ml  Output 4205 ml  Net -1824.3 ml   Filed Weights   05/27/18 0451  05/28/18 0441 05/29/18 0134  Weight: 63.6 kg 63.1 kg 61.5 kg   Examination:  Gen:      Chronically ill-appearing HEENT:  EOMI, sclera anicteric Neck:     No masses; no thyromegaly Lungs:    Clear to auscultation bilaterally; normal respiratory effort CV:         Regular rate and rhythm; no murmurs Abd:      + bowel sounds; soft, non-tender; no palpable masses, no distension Ext:    No edema; adequate peripheral perfusion Skin:      Warm and dry; no rash Neuro: Sedated, opens eyes to commands.  Resolved Hospital Problem list     Assessment & Plan:  Acute hypoxemic respiratory failure: multifactorial at this point. CT concerning for extensive consolidative process in the R lung concerning for HCAP in this immunocompromised patient. 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  P: Continue vent support Reduce PEEP.  No pressure support weans until PEEP is at 5 Continue steroids, nebulizers Follow chest x-ray Follow cultures and cytology on BAL. Lasix for diuresis as she appears volume overloaded.  Sepsis PNA -BCx from 2/3 NGTD P Continue Zosyn  Adenocarcinoma IIIb  - s/p 6x chemo -home carbolatin, paclitaxel  P Holding therapy  Thrombocytopenia plt 24  P -Follow CBC, transfuse plt if <10 without bleeding or <50 with active bleeding -no chemical VTE ppx at this time (SCDs only for  now)  Electrolyte Abnormalities Hypernatremia -likely hypovolemic hypernatremia given history P Free water Start D5W  Malnutrition NPO x several days Tube feeds  Hyperglycemia, at risk - Critical illness, systemic steroids - SSI  Right arm, hand swelling - Ultrasound to check for DVT  Best practice:  Diet: TF Pain/Anxiety/Delirium protocol (if indicated): DC propofol start Precedex to help.  Start fentanyl drip and Versed. VAP protocol (if indicated): Yes  DVT prophylaxis: SCDs GI prophylaxis:  Protonix Glucose control: Monitor CBG, SSI as needed  Mobility: bedrest Code Status: Full  Family Communication: Family updated daily. Disposition: ICU   Labs   CBC: Recent Labs  Lab 05/31/2018 1728  05/25/18 0445 05/26/18 0754 05/26/18 1715 05/27/18 0448 05/28/18 0414 05/28/18 0430 05/29/18 0455  WBC 6.1   < > 8.5 12.2*  --  14.0* 10.9*  --  10.8*  NEUTROABS 4.8  --  8.0*  --   --   --   --   --  10.1*  HGB 11.0*   < > 10.4* 10.0* 9.5* 9.7* 9.6* 9.2* 9.8*  HCT 34.3*   < > 32.9* 32.0* 28.0* 30.4* 30.5* 27.0* 30.0*  MCV 93.0   < > 94.0 92.5  --  93.8 93.0  --  92.6  PLT 128*   < > 60* 24*  --  18* 14*  --  21*   < > = values in this interval not displayed.    Basic Metabolic Panel: Recent Labs  Lab 05/25/18 0445  05/26/18 0754 05/26/18 1715 05/27/18 0448 05/27/18 1800 05/28/18 0414 05/28/18 0430 05/28/18 1715 05/29/18 0455  NA 146*  --  153* 152* 153*  --  154* 151*  --  153*  K 3.6  --  3.3* 3.6 3.3*  --  3.7 3.7  --  3.5  CL 119*  --  119*  --  118*  --  117*  --   --  107  CO2 22  --  25  --  27  --  31  --   --  36*  GLUCOSE 154*  --  114*  --  203*  --  185*  --   --  213*  BUN 14  --  14  --  21*  --  35*  --   --  37*  CREATININE <0.30*  --  0.33*  --  0.80  --  0.48  --   --  0.35*  CALCIUM 8.1*  --  8.7*  --  8.2*  --  8.0*  --   --  7.8*  MG 1.7  --  1.8  --  2.0 1.9 2.0  --  1.9 1.9  PHOS  --    < > 3.8  --  4.4 3.8 3.3  --  2.7 3.4   < > = values in this interval not displayed.   GFR: Estimated Creatinine Clearance: 72.3 mL/min (A) (by C-G formula based on SCr of 0.35 mg/dL (L)). Recent Labs  Lab 05/24/18 1018 05/24/18 1020 05/24/18 1427 05/24/18 1907 05/24/18 2156  05/26/18 0754 05/27/18 0448 05/28/18 0414 05/29/18 0455  PROCALCITON  --  4.03  --   --   --   --   --   --   --   --   WBC  --   --   --   --   --    < > 12.2* 14.0* 10.9* 10.8*  LATICACIDVEN 2.6*  --  2.7* 2.1* 2.1*  --   --   --   --   --    < > =  values in this interval not  displayed.    Liver Function Tests: Recent Labs  Lab 06/05/2018 1728 05/25/18 0445 05/28/18 0414 05/29/18 0455  AST 50* 56* 82* 123*  ALT 39 50* 72* 123*  ALKPHOS 63 76 115 155*  BILITOT 0.8 0.5 0.9 1.3*  PROT 4.7* 3.9* 3.6* 3.4*  ALBUMIN 1.9* 1.4* 1.3* 1.4*   No results for input(s): LIPASE, AMYLASE in the last 168 hours. No results for input(s): AMMONIA in the last 168 hours.  ABG    Component Value Date/Time   PHART 7.430 05/28/2018 0430   PCO2ART 47.8 05/28/2018 0430   PO2ART 77.0 (L) 05/28/2018 0430   HCO3 31.8 (H) 05/28/2018 0430   TCO2 33 (H) 05/28/2018 0430   ACIDBASEDEF 1.3 05/25/2018 1358   O2SAT 95.0 05/28/2018 0430     Coagulation Profile: Recent Labs  Lab 06/02/2018 1728  INR 1.15    Cardiac Enzymes: No results for input(s): CKTOTAL, CKMB, CKMBINDEX, TROPONINI in the last 168 hours.  HbA1C: Hemoglobin A1C  Date/Time Value Ref Range Status  10/30/2015 03:20 PM 5.2  Final   Hgb A1c MFr Bld  Date/Time Value Ref Range Status  04/23/2018 09:02 AM 5.5 4.8 - 5.6 % Final    Comment:    (NOTE) Pre diabetes:          5.7%-6.4% Diabetes:              >6.4% Glycemic control for   <7.0% adults with diabetes     CBG: Recent Labs  Lab 05/28/18 1543 05/28/18 1932 05/28/18 2253 05/29/18 0303 05/29/18 0721  GLUCAP 218* 180* 214* 194* 186*   The patient is critically ill with multiple organ system failure and requires high complexity decision making for assessment and support, frequent evaluation and titration of therapies, advanced monitoring, review of radiographic studies and interpretation of complex data.   Critical Care Time devoted to patient care services, exclusive of separately billable procedures, described in this note is 35 minutes.   Marshell Garfinkel MD [place] [facility] and Critical [facility] Pager [phone] If no answer call [phone] 05/29/2018, 11:08 AM

## 2018-05-30 ENCOUNTER — Inpatient Hospital Stay (HOSPITAL_COMMUNITY): Payer: 59

## 2018-05-30 DIAGNOSIS — M7989 Other specified soft tissue disorders: Secondary | ICD-10-CM

## 2018-05-30 DIAGNOSIS — J9600 Acute respiratory failure, unspecified whether with hypoxia or hypercapnia: Secondary | ICD-10-CM

## 2018-05-30 LAB — GLUCOSE, CAPILLARY
GLUCOSE-CAPILLARY: 137 mg/dL — AB (ref 70–99)
Glucose-Capillary: 119 mg/dL — ABNORMAL HIGH (ref 70–99)
Glucose-Capillary: 127 mg/dL — ABNORMAL HIGH (ref 70–99)
Glucose-Capillary: 160 mg/dL — ABNORMAL HIGH (ref 70–99)
Glucose-Capillary: 175 mg/dL — ABNORMAL HIGH (ref 70–99)
Glucose-Capillary: 219 mg/dL — ABNORMAL HIGH (ref 70–99)

## 2018-05-30 LAB — RESPIRATORY PANEL BY PCR

## 2018-05-30 LAB — POCT I-STAT 7, (LYTES, BLD GAS, ICA,H+H)
Acid-Base Excess: 20 mmol/L — ABNORMAL HIGH (ref 0.0–2.0)
Bicarbonate: 45.9 mmol/L — ABNORMAL HIGH (ref 20.0–28.0)
Calcium, Ion: 1.15 mmol/L (ref 1.15–1.40)
HCT: 28 % — ABNORMAL LOW (ref 36.0–46.0)
HEMOGLOBIN: 9.5 g/dL — AB (ref 12.0–15.0)
O2 Saturation: 93 %
Patient temperature: 99.2
Potassium: 2.6 mmol/L — CL (ref 3.5–5.1)
Sodium: 142 mmol/L (ref 135–145)
TCO2: 48 mmol/L — ABNORMAL HIGH (ref 22–32)
pCO2 arterial: 60.9 mmHg — ABNORMAL HIGH (ref 32.0–48.0)
pH, Arterial: 7.486 — ABNORMAL HIGH (ref 7.350–7.450)
pO2, Arterial: 68 mmHg — ABNORMAL LOW (ref 83.0–108.0)

## 2018-05-30 LAB — PROCALCITONIN: Procalcitonin: 0.56 ng/mL

## 2018-05-30 LAB — PHOSPHORUS: Phosphorus: 2.7 mg/dL (ref 2.5–4.6)

## 2018-05-30 LAB — CBC
HCT: 30.7 % — ABNORMAL LOW (ref 36.0–46.0)
Hemoglobin: 9.6 g/dL — ABNORMAL LOW (ref 12.0–15.0)
MCH: 29.1 pg (ref 26.0–34.0)
MCHC: 31.3 g/dL (ref 30.0–36.0)
MCV: 93 fL (ref 80.0–100.0)
Platelets: 27 10*3/uL — CL (ref 150–400)
RBC: 3.3 MIL/uL — ABNORMAL LOW (ref 3.87–5.11)
RDW: 17.9 % — ABNORMAL HIGH (ref 11.5–15.5)
WBC: 13.6 10*3/uL — ABNORMAL HIGH (ref 4.0–10.5)
nRBC: 0.2 % (ref 0.0–0.2)

## 2018-05-30 LAB — BASIC METABOLIC PANEL
Anion gap: 11 (ref 5–15)
BUN: 24 mg/dL — ABNORMAL HIGH (ref 6–20)
CALCIUM: 7.6 mg/dL — AB (ref 8.9–10.3)
CO2: 38 mmol/L — ABNORMAL HIGH (ref 22–32)
Chloride: 94 mmol/L — ABNORMAL LOW (ref 98–111)
Creatinine, Ser: 0.34 mg/dL — ABNORMAL LOW (ref 0.44–1.00)
GFR calc Af Amer: >60 mL/min (ref >60)
GFR calc non Af Amer: >60 mL/min (ref >60)
Glucose, Bld: 166 mg/dL — ABNORMAL HIGH (ref 70–99)
Potassium: 3.6 mmol/L (ref 3.5–5.1)
Sodium: 143 mmol/L (ref 135–145)

## 2018-05-30 LAB — STREP PNEUMONIAE URINARY ANTIGEN: Strep Pneumo Urinary Antigen: NEGATIVE

## 2018-05-30 LAB — LACTIC ACID, PLASMA: Lactic Acid, Venous: 1.8 mmol/L (ref 0.5–1.9)

## 2018-05-30 LAB — MAGNESIUM: Magnesium: 1.7 mg/dL (ref 1.7–2.4)

## 2018-05-30 LAB — LIPASE, BLOOD: Lipase: 62 U/L — ABNORMAL HIGH (ref 11–51)

## 2018-05-30 LAB — AMYLASE: Amylase: 192 U/L — ABNORMAL HIGH (ref 28–100)

## 2018-05-30 MED ORDER — ONDANSETRON HCL 4 MG/2ML IJ SOLN
4.0000 mg | Freq: Four times a day (QID) | INTRAMUSCULAR | Status: DC | PRN
  Administered 2018-05-30: 4 mg via INTRAVENOUS
  Filled 2018-05-30: qty 2

## 2018-05-30 MED ORDER — MAGNESIUM SULFATE 2 GM/50ML IV SOLN
2.0000 g | Freq: Once | INTRAVENOUS | Status: AC
  Administered 2018-05-30: 2 g via INTRAVENOUS
  Filled 2018-05-30: qty 50

## 2018-05-30 MED ORDER — POTASSIUM CHLORIDE 20 MEQ/15ML (10%) PO SOLN
40.0000 meq | Freq: Once | ORAL | Status: AC
  Administered 2018-05-30: 40 meq
  Filled 2018-05-30: qty 30

## 2018-05-30 MED ORDER — ALPRAZOLAM 0.5 MG PO TABS
1.0000 mg | ORAL_TABLET | Freq: Two times a day (BID) | ORAL | Status: DC
  Administered 2018-05-30 – 2018-06-01 (×5): 1 mg
  Filled 2018-05-30 (×5): qty 2

## 2018-05-30 NOTE — Progress Notes (Signed)
NAME:  Sharon Berg, MRN:  [id number], DOB:  11-05-1965, LOS: 7 ADMISSION DATE:  06/06/2018, CONSULTATION DATE:  05/26/18 REFERRING MD:  Faylene Kurtz, CHIEF COMPLAINT:  Respiratory failure    Brief History   53 year old female, immunocompromised on chemo R lung adenocarcinoma IIIb, with sepsis due to HCAP transferred to [facility] from [place] on 05/26/18 after she was intubated for acute respiratory failure.   History of present illness   53 year old female with PMH adenocarcinoma of right lung, seizure disorder, depression, anxiety, migraine who presented to [facility] ED on 05/25/2018 for shortness of breath with associated cough and vomiting. Shortness of breath began 05/22/2018 and cough began 05/20/2018. Cough is productive with sputum that is purulent and dark brown in color. The patient endorsed vomiting which began 05/21/2018, and had poor oral intake x 3 days prior to presentation on 2/3. She endorsed recent illness, with recent hospitalization 1/4-1/6 2020 for PNA (aspiration and postobstructive) causing sepsis. She denied loose stools and abdominal pain. She endorsed chronic immunosuppression, receiving chemotherapy for lung cancer.   In ED at [facility], the patient was hypotensive, tachycardic and tachypneic. A CXR revealed R lung opacities, concerning for PNA and the patient was started on vanc/cefepime, later changed to pip/tazo and diflucan.   The continued treatment for HCAP. She underwent a CTA on 2/5 at which time PE was ruled out and it was noted that the underlying PNA was worse. Her respiratory status continued to decline, requiring BiPAP. With ongoing high BiPAP settings, the decision was made to intubate the patient 2/6 and transfer to [facility].  PCCM consulted for admission.   Past Medical History  Right Lung adenocarcinoma IIIb Migraine Seizure disorder Thrombocytopenia Depression Anxiety  Significant Hospital Events   2/3> admitted to [facility] 2/6> intubated, transferred  to [facility]   Consults:  PCCM  Procedures:  2/6: ETT, bronch with BAL  Significant Diagnostic Tests:  2/3 CXR> RUL opacities, RLL opacity.   2/5 CTA chest> no PE visualized on CTA. Increased cavitary lesion of right lung and right upper lobe, fistulization of R mainstem bronchus. Diffuse opacities throughout R lung. Small bilateral pleural effusions.   Micro Data:  BCx 2/3> no growth at 5 days MRSA 2/4> neg 2/7 BAL for Culture > neg , AFBsmear > neg , AFBCulture > pending, Fungus culture > pending  Antimicrobials:  Vancomycin 2/3-2/5 Cefepime 2/3  Pip-tazo 2/4>> Diflucan 2/4>>   Interim history/subjective:  Husband at bedside. Patient appears to be improved to him.   Objective   Blood pressure (!) 147/70, pulse 98, temperature 98.5 F (36.9 C), temperature source Axillary, resp. rate 20, height _0  (1.651 m), weight 61.4 kg, SpO2 97 %.    Vent Mode: PRVC FiO2 (%):  [40 %-50 %] 45 % Set Rate:  [22 bmp] 22 bmp Vt Set:  [450 mL] 450 mL PEEP:  [8 cmH20] 8 cmH20 Pressure Support:  [10 cmH20] 10 cmH20 Plateau Pressure:  [18 cmH20-22 cmH20] 18 cmH20   Intake/Output Summary (Last 24 hours) at 05/30/2018 0842 Last data filed at 05/30/2018 0800 Gross per 24 hour  Intake 3788.95 ml  Output 5217 ml  Net -1428.05 ml   Filed Weights   05/28/18 0441 05/29/18 0134 05/30/18 0500  Weight: 63.1 kg 61.5 kg 61.4 kg    Examination: General: awake and alert, following commands  HENT: ETT in place, PERRL, no scleral icterus  Lungs: CTAB, no wheezes, rales, or rhonchi Cardiovascular: RRR, no MRG  Abdomen: soft, nods she is in abdominal pain, bowel sounds present  Extremities: +peripheral edema  Neuro: alert, following commands  GU: foley in place   Resolved Hospital Problem list    Assessment & Plan:  Acute hypoxemic respiratory failure:  Multifactorial. CT concerning for HCAP in immunocompromised patient. 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  P: Continue vent support Reduce PEEP.  No pressure support weans until PEEP is at 5 Continue solumedrol 63m q12h Ipratropium neg bid  CXR 2/10: no changes  Follow cultures and cytology on BAL. Lasix 489mbid   Sepsis PNA -BCx from 2/3 NGTD P Continue Zosyn (2/4>>)  Adenocarcinoma IIIb  - s/p 6x chemo -home carbolatin, paclitaxel  P Holding therapy  Thrombocytopenia plt 27  P -Follow CBC, transfuse plt if <10 without bleeding or <50 with active bleeding -no chemical VTE ppx at this time (SCDs only for now)  Hypernatremia  Na 151>153>142>143 -likely hypovolemic hypernatremia given history P Free water 20029m6h  Malnutrition  Tube feeds  Hyperglycemia, at [place] In setting of Critical illness, systemic steroids AM CBG 160. Received 13U Aspart in 24h - SSI  Right arm, hand swelling - Ultrasound to check for DVT  Best practice:  Diet: Tube feeds  Pain/Anxiety/Delirium protocol (if indicated): fentanyl gtt, versed PRN  VAP protocol (if indicated): Yes  DVT prophylaxis: SCDs GI prophylaxis: Protonix  Glucose control: SSI  Mobility: BR  Code Status: Full  Family Communication: husband at bedside, all questions answered  Disposition: Continued ICU care  Labs   CBC: Recent Labs  Lab 06/08/2018 1728  05/25/18 0445 05/26/18 0754  05/27/18 0448 05/28/18 0414 05/28/18 0430 05/29/18 0455 05/30/18 0354 05/30/18 0435  WBC 6.1   < > 8.5 12.2*  --  14.0* 10.9*  --  10.8*  --  13.6*  NEUTROABS 4.8  --  8.0*  --   --   --   --   --  10.1*  --   --   HGB 11.0*   < > 10.4* 10.0*   < > 9.7* 9.6* 9.2* 9.8* 9.5* 9.6*  HCT 34.3*   < > 32.9* 32.0*   < > 30.4* 30.5* 27.0* 30.0* 28.0* 30.7*  MCV 93.0   < > 94.0 92.5  --  93.8 93.0  --  92.6  --  93.0  PLT 128*   < > 60* 24*  --  18* 14*  --  21*  --  27*   < > = values in this interval not displayed.    Basic Metabolic Panel: Recent Labs  Lab 05/26/18 0754   05/27/18 0448 05/27/18 1800 05/28/18 0414 05/28/18 0430 05/28/18 1715 05/29/18 0455 05/30/18 0354 05/30/18 0435  NA 153*   < > 153*  --  154* 151*  --  153* 142 143  K 3.3*   < > 3.3*  --  3.7 3.7  --  3.5 2.6* 3.6  CL 119*  --  118*  --  117*  --   --  107  --  94*  CO2 25  --  27  --  31  --   --  36*  --  38*  GLUCOSE 114*  --  203*  --  185*  --   --  213*  --  166*  BUN 14  --  21*  --  35*  --   --  37*  --  24*  CREATININE 0.33*  --  0.80  --  0.48  --   --  0.35*  --  0.34*  CALCIUM 8.7*  --  8.2*  --  8.0*  --   --  7.8*  --  7.6*  MG 1.8  --  2.0 1.9 2.0  --  1.9 1.9  --  1.7  PHOS 3.8  --  4.4 3.8 3.3  --  2.7 3.4  --  2.7   < > = values in this interval not displayed.   GFR: Estimated Creatinine Clearance: 72.3 mL/min (A) (by C-G formula based on SCr of 0.34 mg/dL (L)). Recent Labs  Lab 05/24/18 1018 05/24/18 1020 05/24/18 1427 05/24/18 1907 05/24/18 2156  05/27/18 0448 05/28/18 0414 05/29/18 0455 05/30/18 0435  PROCALCITON  --  4.03  --   --   --   --   --   --   --   --   WBC  --   --   --   --   --    < > 14.0* 10.9* 10.8* 13.6*  LATICACIDVEN 2.6*  --  2.7* 2.1* 2.1*  --   --   --   --   --    < > = values in this interval not displayed.    Liver Function Tests: Recent Labs  Lab 06/10/2018 1728 05/25/18 0445 05/28/18 0414 05/29/18 0455  AST 50* 56* 82* 123*  ALT 39 50* 72* 123*  ALKPHOS 63 76 115 155*  BILITOT 0.8 0.5 0.9 1.3*  PROT 4.7* 3.9* 3.6* 3.4*  ALBUMIN 1.9* 1.4* 1.3* 1.4*   No results for input(s): LIPASE, AMYLASE in the last 168 hours. No results for input(s): AMMONIA in the last 168 hours.  ABG    Component Value Date/Time   PHART 7.486 (H) 05/30/2018 0354   PCO2ART 60.9 (H) 05/30/2018 0354   PO2ART 68.0 (L) 05/30/2018 0354   HCO3 45.9 (H) 05/30/2018 0354   TCO2 48 (H) 05/30/2018 0354   ACIDBASEDEF 1.3 05/25/2018 1358   O2SAT 93.0 05/30/2018 0354     Coagulation Profile: Recent Labs  Lab 06/07/2018 1728  INR 1.15     Cardiac Enzymes: No results for input(s): CKTOTAL, CKMB, CKMBINDEX, TROPONINI in the last 168 hours.  HbA1C: Hemoglobin A1C  Date/Time Value Ref Range Status  10/30/2015 03:20 PM 5.2  Final   Hgb A1c MFr Bld  Date/Time Value Ref Range Status  04/23/2018 09:02 AM 5.5 4.8 - 5.6 % Final    Comment:    (NOTE) Pre diabetes:          5.7%-6.4% Diabetes:              >6.4% Glycemic control for   <7.0% adults with diabetes     CBG: Recent Labs  Lab 05/29/18 1520 05/29/18 2002 05/29/18 2347 05/30/18 0355 05/30/18 0719  GLUCAP 170* 153* 216* 137* 160*    Review of Systems:   Unable to obtain as patient is mech vent dependent     Critical care time: 30 min

## 2018-05-30 NOTE — Progress Notes (Signed)
Bilateral upper extremities venous duplex exam completed. Please see preliminary notes on CV PROC under chart review.  Wanda Cellucci H Kiaan Overholser(RDMS RVT) 05/30/18 5:05 PM

## 2018-05-30 NOTE — Progress Notes (Signed)
Palliative:  I met again today at Weslee's bedside with her husband and parents. She continues on vent but FiO2 is down to 40% which is much improved from when I last saw her on Thursday at [facility] right before she transferred to [facility]. She has since had bronch and has had some improvements in oxygenation. They are hopeful for successful weaning over the next few days which had been complicated by anxiety today. I am concerned with nutrition as nutrition has been very poor prior to admission and albumin very low. Tube feeds have been initiated while on vent and she may need some extra support even after extubation. Her goal is to complete her last treatment of chemo and 3 treatments of radiation for stage IIIb lung cancer. Reviewed plans for abd film and [place]. Addressed questions/cocnerns from family. Emotional support provided.   Exam: Sedated on vent. No distress. Tube feedings on. BUE edema.   Plan: - Family awaiting further weaning trials.  - Plans for abd film and [place] BUE. Discussed family concern for possible DVT as difficult as she would not tolerate anticoagulation with low platelets.   2 min  Vinie Sill, NP Palliative Medicine Team Pager # [phone] (M-F 8a-5p) Team Phone # [phone] (Nights/Weekends)

## 2018-05-31 ENCOUNTER — Inpatient Hospital Stay (HOSPITAL_COMMUNITY): Payer: 59

## 2018-05-31 LAB — BASIC METABOLIC PANEL
Anion gap: 10 (ref 5–15)
BUN: 16 mg/dL (ref 6–20)
CO2: 40 mmol/L — ABNORMAL HIGH (ref 22–32)
Calcium: 7.9 mg/dL — ABNORMAL LOW (ref 8.9–10.3)
Chloride: 88 mmol/L — ABNORMAL LOW (ref 98–111)
Glucose, Bld: 166 mg/dL — ABNORMAL HIGH (ref 70–99)
Potassium: 2.8 mmol/L — ABNORMAL LOW (ref 3.5–5.1)
Sodium: 138 mmol/L (ref 135–145)

## 2018-05-31 LAB — LEGIONELLA PNEUMOPHILA SEROGP 1 UR AG: L. pneumophila Serogp 1 Ur Ag: NEGATIVE

## 2018-05-31 LAB — CBC
HCT: 30.4 % — ABNORMAL LOW (ref 36.0–46.0)
Hemoglobin: 9.4 g/dL — ABNORMAL LOW (ref 12.0–15.0)
MCH: 28.8 pg (ref 26.0–34.0)
MCHC: 30.9 g/dL (ref 30.0–36.0)
MCV: 93.3 fL (ref 80.0–100.0)
Platelets: 36 10*3/uL — ABNORMAL LOW (ref 150–400)
RBC: 3.26 MIL/uL — ABNORMAL LOW (ref 3.87–5.11)
RDW: 17.1 % — ABNORMAL HIGH (ref 11.5–15.5)
WBC: 13.9 10*3/uL — ABNORMAL HIGH (ref 4.0–10.5)
nRBC: 0.1 % (ref 0.0–0.2)

## 2018-05-31 LAB — HEPATIC FUNCTION PANEL
ALBUMIN: 1.5 g/dL — AB (ref 3.5–5.0)
ALT: 123 U/L — ABNORMAL HIGH (ref 0–44)
AST: 56 U/L — ABNORMAL HIGH (ref 15–41)
Alkaline Phosphatase: 145 U/L — ABNORMAL HIGH (ref 38–126)
Bilirubin, Direct: 0.6 mg/dL — ABNORMAL HIGH (ref 0.0–0.2)
Indirect Bilirubin: 0.6 mg/dL (ref 0.3–0.9)
Total Bilirubin: 1.2 mg/dL (ref 0.3–1.2)
Total Protein: 4.2 g/dL — ABNORMAL LOW (ref 6.5–8.1)

## 2018-05-31 LAB — GLUCOSE, CAPILLARY
GLUCOSE-CAPILLARY: 144 mg/dL — AB (ref 70–99)
Glucose-Capillary: 156 mg/dL — ABNORMAL HIGH (ref 70–99)
Glucose-Capillary: 144 mg/dL — ABNORMAL HIGH (ref 70–99)
Glucose-Capillary: 161 mg/dL — ABNORMAL HIGH (ref 70–99)
Glucose-Capillary: 110 mg/dL — ABNORMAL HIGH (ref 70–99)

## 2018-05-31 LAB — PHOSPHORUS: Phosphorus: 2.8 mg/dL (ref 2.5–4.6)

## 2018-05-31 LAB — LIPASE, BLOOD: Lipase: 43 U/L (ref 11–51)

## 2018-05-31 LAB — MAGNESIUM: Magnesium: 1.8 mg/dL (ref 1.7–2.4)

## 2018-05-31 LAB — PROCALCITONIN: Procalcitonin: 0.58 ng/mL

## 2018-05-31 MED ORDER — IOHEXOL 300 MG/ML  SOLN
100.0000 mL | Freq: Once | INTRAMUSCULAR | Status: AC | PRN
  Administered 2018-05-31: 100 mL via INTRAVENOUS

## 2018-05-31 MED ORDER — MAGNESIUM SULFATE 2 GM/50ML IV SOLN
2.0000 g | Freq: Once | INTRAVENOUS | Status: AC
  Administered 2018-05-31: 2 g via INTRAVENOUS
  Filled 2018-05-31: qty 50

## 2018-05-31 MED ORDER — POTASSIUM CHLORIDE 20 MEQ/15ML (10%) PO SOLN
40.0000 meq | Freq: Two times a day (BID) | ORAL | Status: AC
  Administered 2018-05-31 (×2): 40 meq
  Filled 2018-05-31 (×2): qty 30

## 2018-05-31 MED ORDER — POTASSIUM CHLORIDE 20 MEQ/15ML (10%) PO SOLN: 40.0000 meq | Freq: Two times a day (BID) | ORAL | Status: DC

## 2018-05-31 MED ORDER — PIPERACILLIN-TAZOBACTAM 3.375 G IVPB
3.3750 g | Freq: Three times a day (TID) | INTRAVENOUS | Status: DC
  Administered 2018-05-31 – 2018-06-01 (×3): 3.375 g via INTRAVENOUS
  Filled 2018-05-31 (×4): qty 50

## 2018-05-31 NOTE — Progress Notes (Signed)
Nutrition Follow-up  DOCUMENTATION CODES:   Severe malnutrition in context of chronic illness  INTERVENTION:   Recommend resume TF via OGT:  Vital AF 1.2 at 55 ml/h to provide 1584 kcal, 99 gm protein, 1071 ml free water daily.  NUTRITION DIAGNOSIS:   Severe Malnutrition(Chronic) related to cancer and cancer related treatments, nausea as evidenced by energy intake < or equal to 75% for > or equal to 1 month, percent weight loss.  Ongoing  GOAL:   Patient will meet greater than or equal to 90% of their needs  Unmet  MONITOR:   Vent status, TF tolerance, Skin, Labs, I & O's  ASSESSMENT:   53 y/o female PMHx Anxiety, depression, significant tobacco abuse (>55 pack yr), and active lung cancer (dx Nov 2019) for which she has been receiving chemoradiation. Presented to ED from OP oncology appt d/t hypoxia, tachycardia and hypotension. CXR showed multilobar PNA. Admitted for sepsis 2/2 HCAP.   Patient remains intubated on ventilator support MV: 10.5 L/min Temp (24hrs), Avg:98.8 F (37.1 C), Min:98.2 F (36.8 C), Max:99.8 F (37.7 C)   TF on hold since yesterday afternoon due to abdominal pain. WBC elevated at 13.9. S/P CT abdomen earlier today showed mild acute pancreatitis, no metastatic disease in the abdomen, mildly distended gallbladder.  Labs reviewed. Potassium 2.8 (L) CBG's: [phone] Medications reviewed and include free water 200 ml every 6 hours, lasix, novolog, KCl, solumedrol  Palliative care team is following.  Diet Order:   Diet Order            Diet NPO time specified  Diet effective now              EDUCATION NEEDS:   Education needs have been addressed  Skin:  Skin Assessment: Reviewed RN Assessment  Last BM:  2/11  Height:   Ht Readings from Last 1 Encounters:  05/26/18 5\' 5"  (1.651 m)    Weight:   Wt Readings from Last 1 Encounters:  05/31/18 61.8 kg    Ideal Body Weight:  56.82 kg  BMI:  Body mass index is 22.67  kg/m.  Estimated Nutritional Needs:   Kcal:  1580  Protein:  95-115 gm  Fluid:  >/= 1.5 L    Molli Barrows, RD, LDN, [place] Pager [phone] After Hours Pager [phone]

## 2018-05-31 NOTE — Progress Notes (Signed)
Patient transported to CT & back on ventilator without complication.  Kathie Dike RRT

## 2018-05-31 NOTE — Progress Notes (Signed)
Pharmacy Antibiotic Note  Sharon Berg is a 53 y.o. female admitted on 05/22/2018 with pneumonia. Pharmacy managing Zosyn dosing. WBC unchanged at 13.9. Remains afebrile. CT abdomen pending to rule out possible source.   Plan: Continue Zosyn 3.375g IV every 8 hours Monitor labs, c/s, and patient improvement.  Height: _0  (165.1 cm) Weight: 136 lb 3.9 oz (61.8 kg) IBW/kg (Calculated) : 57  Temp (24hrs), Avg:98.7 F (37.1 C), Min:97.9 F (36.6 C), Max:99.8 F (37.7 C)  Recent Labs  Lab 05/24/18 1427 05/24/18 1907 05/24/18 2156  05/27/18 0448 05/28/18 0414 05/29/18 0455 05/30/18 0435 05/30/18 1109 05/31/18 0445  WBC  --   --   --    < > 14.0* 10.9* 10.8* 13.6*  --  13.9*  CREATININE  --   --   --    < > 0.80 0.48 0.35* 0.34*  --  <0.30*  LATICACIDVEN 2.7* 2.1* 2.1*  --   --   --   --   --  1.8  --    < > = values in this interval not displayed.    CrCl cannot be calculated (This lab value cannot be used to calculate CrCl because it is not a number: <0.30).    Allergies  Allergen Reactions  . Levaquin [Levofloxacin] Other (See Comments)    Myalgia  . Codeine Nausea And Vomiting    Antimicrobials this admission: Vanco 2/3 >> 2/5 Cefepime 2/3 >> 2/5 Zosyn 2/5 >>  Dose adjustments this admission: N/A  Microbiology results: 2/3BCx: negF 2/4 MRSA PCR: negative 2/6 BAL >> pending  2/6 AFB negative 2/6 Fungal >> 2/6 Pneumocystis >> 2/6 UCx negative 2/10 RVP negative   Thank you for allowing pharmacy to be a part of this patient's care.  Sloan Leiter, PharmD, BCPS, BCCCP Clinical Pharmacist Please refer to [facility] for [place] numbers If after 8:30pm, please refer to [facility] for unit-specific pharmacist

## 2018-05-31 NOTE — Progress Notes (Signed)
Lab review  - Improving LFT and bili - CT abd - c/w mild pancreatitis and based on IUS - likely gall stone passage - No evidence of cancer in abdomen   A Mild pancreatitis Gall stone related patoent neve got diprivan per RN  P Continue NPO rechallenge TF tomorrow   Husband updated   MR    LABS    PULMONARY Recent Labs  Lab 05/26/18 1530 05/26/18 1715 05/27/18 0359 05/28/18 0430 05/30/18 0354  PHART 7.220* 7.321* 7.341* 7.430 7.486*  PCO2ART 69.9* 58.5* 51.2* 47.8 60.9*  PO2ART 82.9* 174.0* 204* 77.0* 68.0*  HCO3 23.5 30.2* 27.0 31.8* 45.9*  TCO2  --  32  --  33* 48*  O2SAT 91.3 99.0 98.8 95.0 93.0    CBC Recent Labs  Lab 05/29/18 0455 05/30/18 0354 05/30/18 0435 05/31/18 0445  HGB 9.8* 9.5* 9.6* 9.4*  HCT 30.0* 28.0* 30.7* 30.4*  WBC 10.8*  --  13.6* 13.9*  PLT 21*  --  27* 36*    COAGULATION No results for input(s): INR in the last 168 hours.  CARDIAC  No results for input(s): TROPONINI in the last 168 hours. No results for input(s): PROBNP in the last 168 hours.   CHEMISTRY Recent Labs  Lab 05/27/18 0448  05/28/18 0414 05/28/18 0430 05/28/18 1715 05/29/18 0455 05/30/18 0354 05/30/18 0435 05/31/18 0445  NA 153*  --  154* 151*  --  153* 142 143 138  K 3.3*  --  3.7 3.7  --  3.5 2.6* 3.6 2.8*  CL 118*  --  117*  --   --  107  --  94* 88*  CO2 27  --  31  --   --  36*  --  38* 40*  GLUCOSE 203*  --  185*  --   --  213*  --  166* 166*  BUN 21*  --  35*  --   --  37*  --  24* 16  CREATININE 0.80  --  0.48  --   --  0.35*  --  0.34* <0.30*  CALCIUM 8.2*  --  8.0*  --   --  7.8*  --  7.6* 7.9*  MG 2.0   < > 2.0  --  1.9 1.9  --  1.7 1.8  PHOS 4.4   < > 3.3  --  2.7 3.4  --  2.7 2.8   < > = values in this interval not displayed.   CrCl cannot be calculated (This lab value cannot be used to calculate CrCl because it is not a number: <0.30).   LIVER Recent Labs  Lab 05/25/18 0445 05/28/18 0414 05/29/18 0455 05/31/18 0956  AST  56* 82* 123* 56*  ALT 50* 72* 123* 123*  ALKPHOS 76 115 155* 145*  BILITOT 0.5 0.9 1.3* 1.2  PROT 3.9* 3.6* 3.4* 4.2*  ALBUMIN 1.4* 1.3* 1.4* 1.5*     INFECTIOUS Recent Labs  Lab 05/24/18 1907 05/24/18 2156 05/30/18 1025 05/30/18 1109 05/31/18 0445  LATICACIDVEN 2.1* 2.1*  --  1.8  --   PROCALCITON  --   --  0.56  --  0.58     ENDOCRINE CBG (last 3)  Recent Labs    05/31/18 0747 05/31/18 1156 05/31/18 1509  GLUCAP 144* 161* 144*         IMAGING x48h  - image(s) personally visualized  -   highlighted in bold Ct Abdomen Pelvis W Wo Contrast  Result Date: 05/31/2018 CLINICAL DATA:  Inpatient. Stage IIIB right upper lobe lung adenocarcinoma diagnosed November 2018. Ongoing chemoradiation therapy. Admitted with sepsis, pneumonia and acute respiratory failure. Reported jaundice, abdominal pain and fever. Clinical concern for liver metastases. EXAM: CT ABDOMEN AND PELVIS WITHOUT AND WITH CONTRAST TECHNIQUE: Multidetector CT imaging of the abdomen and pelvis was performed following the standard protocol before and following the bolus administration of intravenous contrast. CONTRAST:  126mL OMNIPAQUE IOHEXOL 300 MG/ML  SOLN COMPARISON:  05/30/2018 abdominal sonogram. 03/11/2018 PET-CT. 08/27/2011 CT abdomen/pelvis. FINDINGS: Lower chest: Centrilobular emphysema both lung bases. Mild patchy consolidation in the dependent lower lobes bilaterally, improved since 05/25/2018 chest CT. Interlobular septal thickening throughout both lung bases and patchy ground-glass opacity, improved. Hepatobiliary: Diffuse hepatic steatosis. Normal liver size. No definite liver surface irregularity. No liver masses. Mildly distended gallbladder. No radiopaque cholelithiasis. Borderline mild gallbladder wall thickening. No pericholecystic fluid. No biliary ductal dilatation. CBD diameter 6 mm. Pancreas: There is mild diffuse fullness of the pancreas with slight haziness of the peripancreatic fat, suggestive  of mild acute pancreatitis. Preserved pancreatic parenchymal enhancement. Spleen: Normal size. No mass. Adrenals/Urinary Tract: Normal adrenals. No renal stones. No hydronephrosis. Scattered subcentimeter hypodense renal cortical lesions in both kidneys are too small to characterize and require no follow-up. Bladder collapsed by indwelling Foley catheter. Expected gas in the nondependent bladder lumen from instrumentation. Stomach/Bowel: Enteric tube terminates in distal stomach. Stomach is nondistended and normal. Normal caliber small bowel with no small bowel wall thickening. Normal appendix. Fluid levels throughout the large bowel, with no large bowel wall thickening, diverticulosis or significant pericolonic fat stranding. Vascular/Lymphatic: Atherosclerotic nonaneurysmal abdominal aorta. Patent portal, splenic, hepatic and renal veins. No pathologically enlarged lymph nodes in the abdomen or pelvis. Reproductive: Status post hysterectomy, with no abnormal findings at the vaginal cuff. No adnexal mass. Other: No pneumoperitoneum, ascites or focal fluid collection. Mild anasarca. Musculoskeletal: No aggressive appearing focal osseous lesions.   IMPRESSION: 1. No evidence of metastatic disease in the abdomen or pelvis. 2. Mild diffuse fullness of the pancreas with slight haziness of the peripancreatic fat, suggestive of mild acute non-necrotizing pancreatitis. No peripancreatic fluid collections. No pancreatic or biliary ductal dilatation. 3. Mildly distended gallbladder with borderline mild gallbladder wall thickening, nonspecific. No radiopaque cholelithiasis. No pericholecystic fluid. 4. Diffuse hepatic steatosis.  No liver masses. 5. Fluid levels throughout the large bowel without bowel wall thickening or bowel obstruction. Findings are compatible with a nonspecific malabsorptive state. 6. Mild anasarca. 7. Mild patchy consolidation, interlobular septal thickening and ground-glass opacity at the lung bases,  improved since 05/25/2018 chest CT. 8. Aortic Atherosclerosis (ICD10-I70.0) and Emphysema (ICD10-J43.9). Electronically Signed   By: Ilona Sorrel M.D.   On: 05/31/2018 13:58   [place] Abd

## 2018-05-31 NOTE — Progress Notes (Addendum)
NAME:  Sharon Berg, MRN:  [id number], DOB:  04/23/1965, LOS: 8 ADMISSION DATE:  06/17/2018, CONSULTATION DATE:  05/26/18 REFERRING MD:  Faylene Kurtz, CHIEF COMPLAINT:  Respiratory failure    Brief History   53 year old female, immunocompromised on chemo R lung adenocarcinoma IIIb, with sepsis due to HCAP transferred to [facility] from [place] on 05/26/18 after she was intubated for acute respiratory failure.   History of present illness   53 year old female with PMH adenocarcinoma of right lung, seizure disorder, depression, anxiety, migraine who presented to [facility] ED on 05/28/2018 for shortness of breath with associated cough and vomiting. Shortness of breath began 05/22/2018 and cough began 05/20/2018. Cough is productive with sputum that is purulent and dark brown in color. The patient endorsed vomiting which began 05/21/2018, and had poor oral intake x 3 days prior to presentation on 2/3. She endorsed recent illness, with recent hospitalization 1/4-1/6 2020 for PNA (aspiration and postobstructive) causing sepsis. She denied loose stools and abdominal pain. She endorsed chronic immunosuppression, receiving chemotherapy for lung cancer.   In ED at [facility], the patient was hypotensive, tachycardic and tachypneic. A CXR revealed R lung opacities, concerning for PNA and the patient was started on vanc/cefepime, later changed to pip/tazo and diflucan.   The continued treatment for HCAP. She underwent a CTA on 2/5 at which time PE was ruled out and it was noted that the underlying PNA was worse. Her respiratory status continued to decline, requiring BiPAP. With ongoing high BiPAP settings, the decision was made to intubate the patient 2/6 and transfer to [facility].  PCCM consulted for admission.   Past Medical History  Right Lung adenocarcinoma IIIb Migraine Seizure disorder Thrombocytopenia Depression Anxiety  Significant Hospital Events   2/3> admitted to [facility] 2/6> intubated, transferred  to [facility]   Consults:  PCCM  Procedures:  2/6: ETT, bronch with BAL  Significant Diagnostic Tests:  2/3 CXR> RUL opacities, RLL opacity.   2/5 CTA chest> no PE visualized on CTA. Increased cavitary lesion of right lung and right upper lobe, fistulization of R mainstem bronchus. Diffuse opacities throughout R lung. Small bilateral pleural effusions.  2/10 UE Venous dopplers: No DVT bilaterally  2/10 Abd [place]: mild dilation of common bile duct, fatty infiltration of liver 2/10 Abd Xray: neg   Micro Data:  BCx 2/3> no growth at 5 days MRSA 2/4> neg 2/7 BAL for Culture > neg , AFBsmear > neg , AFBCulture > pending, Fungus culture > pending  Antimicrobials:  Vancomycin 2/3-2/5 Cefepime 2/3  Pip-tazo 2/4>> Diflucan 2/4>>   Interim history/subjective:  Patient nodding that she is in pain. When asked where she states all over.   Objective   Blood pressure 139/63, pulse (!) 101, temperature 98.7 F (37.1 C), temperature source Oral, resp. rate 17, height _0  (1.651 m), weight 61.8 kg, SpO2 100 %.    Vent Mode: PRVC FiO2 (%):  [40 %-70 %] 70 % Set Rate:  [22 bmp] 22 bmp Vt Set:  [450 mL] 450 mL PEEP:  [5 cmH20-8 cmH20] 8 cmH20 Pressure Support:  [5 cmH20] 5 cmH20 Plateau Pressure:  [17 cmH20-21 cmH20] 19 cmH20   Intake/Output Summary (Last 24 hours) at 05/31/2018 0703 Last data filed at 05/31/2018 0600 Gross per 24 hour  Intake 3311.43 ml  Output 3668 ml  Net -356.57 ml   Filed Weights   05/29/18 0134 05/30/18 0500 05/31/18 0439  Weight: 61.5 kg 61.4 kg 61.8 kg  Examination: General: awake, ETT in place, mech vent dependent  HENT: moist mucous membranes, PERRL Lungs: CTAB, no wheezes, rales, or rhonchi  Cardiovascular: RRR, no MRG  Abdomen: soft, tender, non distended, bowel sounds present  Extremities: no edema, 2+ DP pulses  Neuro: no focal deficits  GU: foley in place   Resolved Hospital Problem list   Hypernatremia   Assessment & Plan:  Acute hypoxemic  respiratory failure:  Multifactorial. CT concerning for HCAP in immunocompromised patient. 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  RVP negative Strep pneumo negative P: Continue vent support Reduce PEEP.  No pressure support weans until PEEP is at 5 Continue solumedrol 59m q12h Ipratropium neg bid  CXR 2/10: no changes  Follow cultures and cytology on BAL. Lasix 454mbid  Legionella pending   Sepsis PNA -BCx from 2/3 NGTD P Continue Zosyn (2/4>>)  Adenocarcinoma IIIb  - s/p 6x chemo -home carbolatin, paclitaxel  P Holding therapy  Thrombocytopenia plt 27  P -Follow CBC, transfuse plt if <10 without bleeding or <50 with active bleeding -no chemical VTE ppx at this time (SCDs only for now)  Malnutrition  Tube feeds  Hyperglycemia, at risk In setting of Critical illness, systemic steroids AM CBG 160. Received 8U Aspart in 24h - SSI  Right arm, hand swelling - [place] for DVT   Abdominal pain Lipase initially elevated to 62 > trended down to 43.  Abd xray neg Abd [place] mild dilation of common bile duct, fatty infiltration of liver - continue to monitor - holding tube feeds   Best practice:  Diet: holding tube feeds Pain/Anxiety/Delirium protocol (if indicated): fentanyl gtt, versed PRN  VAP protocol (if indicated): Yes  DVT prophylaxis: SCDs GI prophylaxis: Protonix  Glucose control: SSI  Mobility: BR  Code Status: Full  Family Communication: no family at bedside  Disposition: Continued ICU care  Labs   CBC: Recent Labs  Lab 05/25/18 0445 05/26/18 0754  05/27/18 0448 05/28/18 0414 05/28/18 0430 05/29/18 0455 05/30/18 0354 05/30/18 0435  WBC 8.5 12.2*  --  14.0* 10.9*  --  10.8*  --  13.6*  NEUTROABS 8.0*  --   --   --   --   --  10.1*  --   --   HGB 10.4* 10.0*   < > 9.7* 9.6* 9.2* 9.8* 9.5* 9.6*  HCT 32.9* 32.0*   < > 30.4* 30.5* 27.0* 30.0* 28.0*  30.7*  MCV 94.0 92.5  --  93.8 93.0  --  92.6  --  93.0  PLT 60* 24*  --  18* 14*  --  21*  --  27*   < > = values in this interval not displayed.    Basic Metabolic Panel: Recent Labs  Lab 05/27/18 0448  05/28/18 0414 05/28/18 0430 05/28/18 1715 05/29/18 0455 05/30/18 0354 05/30/18 0435 05/31/18 0445  NA 153*  --  154* 151*  --  153* 142 143 138  K 3.3*  --  3.7 3.7  --  3.5 2.6* 3.6 2.8*  CL 118*  --  117*  --   --  107  --  94* 88*  CO2 27  --  31  --   --  36*  --  38* 40*  GLUCOSE 203*  --  185*  --   --  213*  --  166* 166*  BUN 21*  --  35*  --   --  37*  --  24* 16  CREATININE 0.80  --  0.48  --   --  0.35*  --  0.34* <0.30*  CALCIUM 8.2*  --  8.0*  --   --  7.8*  --  7.6* 7.9*  MG 2.0   < > 2.0  --  1.9 1.9  --  1.7 1.8  PHOS 4.4   < > 3.3  --  2.7 3.4  --  2.7 2.8   < > = values in this interval not displayed.   GFR: CrCl cannot be calculated (This lab value cannot be used to calculate CrCl because it is not a number: <0.30). Recent Labs  Lab 05/24/18 1020 05/24/18 1427 05/24/18 1907 05/24/18 2156  05/27/18 0448 05/28/18 0414 05/29/18 0455 05/30/18 0435 05/30/18 1025 05/30/18 1109  PROCALCITON 4.03  --   --   --   --   --   --   --   --  0.56  --   WBC  --   --   --   --    < > 14.0* 10.9* 10.8* 13.6*  --   --   LATICACIDVEN  --  2.7* 2.1* 2.1*  --   --   --   --   --   --  1.8   < > = values in this interval not displayed.    Liver Function Tests: Recent Labs  Lab 05/25/18 0445 05/28/18 0414 05/29/18 0455  AST 56* 82* 123*  ALT 50* 72* 123*  ALKPHOS 76 115 155*  BILITOT 0.5 0.9 1.3*  PROT 3.9* 3.6* 3.4*  ALBUMIN 1.4* 1.3* 1.4*   Recent Labs  Lab 05/30/18 1025 05/31/18 0445  LIPASE 62* 43  AMYLASE 192*  --    No results for input(s): AMMONIA in the last 168 hours.  ABG    Component Value Date/Time   PHART 7.486 (H) 05/30/2018 0354   PCO2ART 60.9 (H) 05/30/2018 0354   PO2ART 68.0 (L) 05/30/2018 0354   HCO3 45.9 (H) 05/30/2018 0354     TCO2 48 (H) 05/30/2018 0354   ACIDBASEDEF 1.3 05/25/2018 1358   O2SAT 93.0 05/30/2018 0354     Coagulation Profile: No results for input(s): INR, PROTIME in the last 168 hours.  Cardiac Enzymes: No results for input(s): CKTOTAL, CKMB, CKMBINDEX, TROPONINI in the last 168 hours.  HbA1C: Hemoglobin A1C  Date/Time Value Ref Range Status  10/30/2015 03:20 PM 5.2  Final   Hgb A1c MFr Bld  Date/Time Value Ref Range Status  04/23/2018 09:02 AM 5.5 4.8 - 5.6 % Final    Comment:    (NOTE) Pre diabetes:          5.7%-6.4% Diabetes:              >6.4% Glycemic control for   <7.0% adults with diabetes     CBG: Recent Labs  Lab 05/30/18 1116 05/30/18 1532 05/30/18 1930 05/30/18 2345 05/31/18 0423  GLUCAP 219* 175* 127* 119* 156*    Review of Systems:   All over pain   Critical care time: 30 min

## 2018-05-31 NOTE — Progress Notes (Signed)
Per Dr. Chase Caller, resume tube feeds tomorrow (2/12).

## 2018-06-01 ENCOUNTER — Inpatient Hospital Stay (HOSPITAL_COMMUNITY): Payer: 59

## 2018-06-01 LAB — COMPREHENSIVE METABOLIC PANEL
ALT: 102 U/L — ABNORMAL HIGH (ref 0–44)
ANION GAP: 7 (ref 5–15)
AST: 48 U/L — ABNORMAL HIGH (ref 15–41)
Albumin: 1.4 g/dL — ABNORMAL LOW (ref 3.5–5.0)
Alkaline Phosphatase: 145 U/L — ABNORMAL HIGH (ref 38–126)
BUN: 12 mg/dL (ref 6–20)
CO2: 41 mmol/L — ABNORMAL HIGH (ref 22–32)
Calcium: 7.7 mg/dL — ABNORMAL LOW (ref 8.9–10.3)
Chloride: 85 mmol/L — ABNORMAL LOW (ref 98–111)
Creatinine, Ser: 0.35 mg/dL — ABNORMAL LOW (ref 0.44–1.00)
GFR calc non Af Amer: >60 mL/min (ref >60)
Glucose, Bld: 161 mg/dL — ABNORMAL HIGH (ref 70–99)
POTASSIUM: 3.4 mmol/L — AB (ref 3.5–5.1)
Sodium: 133 mmol/L — ABNORMAL LOW (ref 135–145)
Total Bilirubin: 0.9 mg/dL (ref 0.3–1.2)
Total Protein: 4.1 g/dL — ABNORMAL LOW (ref 6.5–8.1)

## 2018-06-01 LAB — CBC
HCT: 28.4 % — ABNORMAL LOW (ref 36.0–46.0)
Hemoglobin: 9 g/dL — ABNORMAL LOW (ref 12.0–15.0)
MCH: 29.6 pg (ref 26.0–34.0)
MCHC: 31.7 g/dL (ref 30.0–36.0)
MCV: 93.4 fL (ref 80.0–100.0)
Platelets: 48 10*3/uL — ABNORMAL LOW (ref 150–400)
RBC: 3.04 MIL/uL — ABNORMAL LOW (ref 3.87–5.11)
RDW: 16.8 % — AB (ref 11.5–15.5)
WBC: 13.7 10*3/uL — ABNORMAL HIGH (ref 4.0–10.5)
nRBC: 0 % (ref 0.0–0.2)

## 2018-06-01 LAB — GLUCOSE, CAPILLARY
GLUCOSE-CAPILLARY: 146 mg/dL — AB (ref 70–99)
GLUCOSE-CAPILLARY: 147 mg/dL — AB (ref 70–99)
Glucose-Capillary: 137 mg/dL — ABNORMAL HIGH (ref 70–99)
Glucose-Capillary: 143 mg/dL — ABNORMAL HIGH (ref 70–99)
Glucose-Capillary: 157 mg/dL — ABNORMAL HIGH (ref 70–99)
Glucose-Capillary: 161 mg/dL — ABNORMAL HIGH (ref 70–99)
Glucose-Capillary: 176 mg/dL — ABNORMAL HIGH (ref 70–99)

## 2018-06-01 LAB — PHOSPHORUS: Phosphorus: 2.7 mg/dL (ref 2.5–4.6)

## 2018-06-01 LAB — PROCALCITONIN: PROCALCITONIN: 0.75 ng/mL

## 2018-06-01 LAB — MAGNESIUM: Magnesium: 1.8 mg/dL (ref 1.7–2.4)

## 2018-06-01 MED ORDER — ORAL CARE MOUTH RINSE
15.0000 mL | Freq: Two times a day (BID) | OROMUCOSAL | Status: DC
  Administered 2018-06-01 – 2018-06-04 (×7): 15 mL via OROMUCOSAL

## 2018-06-01 MED ORDER — FENTANYL CITRATE (PF) 100 MCG/2ML IJ SOLN
25.0000 ug | INTRAMUSCULAR | Status: DC | PRN
  Administered 2018-06-01: 25 ug via INTRAVENOUS
  Administered 2018-06-02 (×2): 100 ug via INTRAVENOUS
  Administered 2018-06-02: 25 ug via INTRAVENOUS
  Administered 2018-06-02: 50 ug via INTRAVENOUS
  Administered 2018-06-02 – 2018-06-03 (×7): 100 ug via INTRAVENOUS
  Filled 2018-06-01 (×12): qty 2

## 2018-06-01 MED ORDER — POTASSIUM CHLORIDE 20 MEQ/15ML (10%) PO SOLN
40.0000 meq | Freq: Once | ORAL | Status: AC
  Administered 2018-06-01: 40 meq via ORAL
  Filled 2018-06-01: qty 30

## 2018-06-01 MED ORDER — DEXTROSE-NACL 5-0.45 % IV SOLN
INTRAVENOUS | Status: DC
  Administered 2018-06-01 – 2018-06-03 (×3): via INTRAVENOUS

## 2018-06-01 MED ORDER — FENTANYL CITRATE (PF) 100 MCG/2ML IJ SOLN
INTRAMUSCULAR | Status: AC
  Filled 2018-06-01: qty 2

## 2018-06-01 MED ORDER — DEXMEDETOMIDINE HCL IN NACL 400 MCG/100ML IV SOLN
0.4000 ug/kg/h | INTRAVENOUS | Status: DC
  Administered 2018-06-01 (×2): 0.4 ug/kg/h via INTRAVENOUS
  Administered 2018-06-02: 0.5 ug/kg/h via INTRAVENOUS
  Filled 2018-06-01 (×3): qty 100

## 2018-06-01 MED ORDER — MAGNESIUM SULFATE 2 GM/50ML IV SOLN
2.0000 g | Freq: Once | INTRAVENOUS | Status: AC
  Administered 2018-06-01: 2 g via INTRAVENOUS
  Filled 2018-06-01: qty 50

## 2018-06-01 NOTE — Progress Notes (Signed)
Placed patient on heated high flow with flow set at 30 LPM and fio2 set at 60%. Patient does not appear to be in any respiratory distress at this time. Will continue to monitor and place patient back on bipap over night.

## 2018-06-01 NOTE — Progress Notes (Signed)
NAME:  Sharon Berg, MRN:  [id number], DOB:  05/13/1965, LOS: 9 ADMISSION DATE:  06/16/2018, CONSULTATION DATE:  05/26/18 REFERRING MD:  Faylene Kurtz, CHIEF COMPLAINT:  Respiratory failure    Brief History   53 year old female, immunocompromised on chemo R lung adenocarcinoma IIIb, with sepsis due to HCAP transferred to [facility] from [place] on 05/26/18 after she was intubated for acute respiratory failure.   History of present illness   53 year old female with PMH adenocarcinoma of right lung, seizure disorder, depression, anxiety, migraine who presented to [facility] ED on 06/10/2018 for shortness of breath with associated cough and vomiting. Shortness of breath began 05/22/2018 and cough began 05/20/2018. Cough is productive with sputum that is purulent and dark brown in color. The patient endorsed vomiting which began 05/21/2018, and had poor oral intake x 3 days prior to presentation on 2/3. She endorsed recent illness, with recent hospitalization 1/4-1/6 2020 for PNA (aspiration and postobstructive) causing sepsis. She denied loose stools and abdominal pain. She endorsed chronic immunosuppression, receiving chemotherapy for lung cancer.   In ED at [facility], the patient was hypotensive, tachycardic and tachypneic. A CXR revealed R lung opacities, concerning for PNA and the patient was started on vanc/cefepime, later changed to pip/tazo and diflucan.   The continued treatment for HCAP. She underwent a CTA on 2/5 at which time PE was ruled out and it was noted that the underlying PNA was worse. Her respiratory status continued to decline, requiring BiPAP. With ongoing high BiPAP settings, the decision was made to intubate the patient 2/6 and transfer to [facility].  PCCM consulted for admission.   Past Medical History  Right Lung adenocarcinoma IIIb Migraine Seizure disorder Thrombocytopenia Depression Anxiety  Significant Hospital Events   2/3> admitted to [facility] 2/6> intubated, transferred  to [facility]   Consults:  PCCM  Procedures:  2/6: ETT, bronch with BAL  Significant Diagnostic Tests:  2/3 CXR> RUL opacities, RLL opacity.   2/5 CTA chest> no PE visualized on CTA. Increased cavitary lesion of right lung and right upper lobe, fistulization of R mainstem bronchus. Diffuse opacities throughout R lung. Small bilateral pleural effusions.  2/10 UE Venous dopplers: No DVT bilaterally  2/10 Abd [place]: mild dilation of common bile duct, fatty infiltration of liver 2/10 Abd Xray: neg  2/11 CT ab: c/w mild pancreatitis and likely gall stone passage   Micro Data:  BCx 2/3> no growth at 5 days MRSA 2/4> neg 2/7 BAL for Culture > neg , AFBsmear > neg , AFBCulture > pending, Fungus culture > pending  Antimicrobials:  Vancomycin 2/3-2/5 Cefepime 2/3  Pip-tazo 2/4>> Diflucan 2/4>>   Interim history/subjective:  Per RN patient has been difficult to wean off vent. Unable to change setting yesterday during day and at night when attempted, patient failed wean   Objective   Blood pressure (!) 121/58, pulse 90, temperature 97.6 F (36.4 C), temperature source Axillary, resp. rate (!) 23, height _0  (1.651 m), weight 61.6 kg, SpO2 100 %.    Vent Mode: PRVC FiO2 (%):  [40 %-60 %] 50 % Set Rate:  [22 bmp] 22 bmp Vt Set:  [450 mL] 450 mL PEEP:  [8 cmH20] 8 cmH20 Plateau Pressure:  [16 cmH20-22 cmH20] 16 cmH20   Intake/Output Summary (Last 24 hours) at 06/01/2018 0831 Last data filed at 06/01/2018 0800 Gross per 24 hour  Intake 3015.31 ml  Output 2120 ml  Net 895.31 ml   [employer]  05/30/18 0500 05/31/18 0439 06/01/18 0500  Weight: 61.4 kg 61.8 kg 61.6 kg    Examination: General: awake and alert, ETT in place, mech vent dependent  HENT: PERRL, no scleral icterus  Lungs: CTAB, no wheezes, rales, or rhonchi  Cardiovascular: RRR, no MRG  Abdomen: soft, tender, non distended, bowel sounds normal   Extremities: no edema, 2+ DP pulses  Neuro: awake GU: foley in  place  Resolved Hospital Problem list   Hypernatremia   Assessment & Plan:  Acute hypoxemic respiratory failure:  Multifactorial. CT concerning for HCAP in immunocompromised patient. 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  RVP negative Strep pneumo negative P: Continue vent support Reduce PEEP.  No pressure support weans until PEEP is at 5 Continue solumedrol 25m q12h Ipratropium neg bid  CXR 2/10: no changes  Follow cultures and cytology on BAL. Lasix 412mbid  Legionella pending   Hyponatremia Na 138>133 P Discontinue free water Continue to monitor on daily CMP  Hypokalemia K of 3.4 P Replete PRN  Transaminitis Improved P Trend LFTs  Sepsis PNA -BCx from 2/3 NGTD P Continue Zosyn (2/4>>)  Adenocarcinoma IIIb  - s/p 6x chemo -home carbolatin, paclitaxel  P Holding therapy  Hypomagnesemia Mg 1.8, goal >2 P Replete PRN  Thrombocytopenia plt 48 P -Follow CBC, transfuse plt if <10 without bleeding or <50 with active bleeding -no chemical VTE ppx at this time (SCDs only for now)  Malnutrition  Tube feeds  Hyperglycemia, at risk In setting of Critical illness, systemic steroids AM CBG 160. Received 8U Aspart in 24h - SSI  Right arm, hand swelling - US[place]egative for DVT   Abdominal pain Abd xray neg Abd [place] mild dilation of common bile duct, fatty infiltration of liver Abd CT showing mild pancreatitis and likely gall stone passage  - continue to monitor - holding tube feeds, will plan to restart today   - Zosyn as above  Best practice:  Diet: tube feeds Pain/Anxiety/Delirium protocol (if indicated): fentanyl gtt, versed PRN  VAP protocol (if indicated): Yes  DVT prophylaxis: SCDs GI prophylaxis: Protonix  Glucose control: SSI  Mobility: BR  Code Status: Full  Family Communication: no family at bedside  Disposition: Continued ICU care  Labs    CBC: Recent Labs  Lab 05/28/18 0414  05/29/18 0455 05/30/18 0354 05/30/18 0435 05/31/18 0445 06/01/18 0456  WBC 10.9*  --  10.8*  --  13.6* 13.9* 13.7*  NEUTROABS  --   --  10.1*  --   --   --   --   HGB 9.6*   < > 9.8* 9.5* 9.6* 9.4* 9.0*  HCT 30.5*   < > 30.0* 28.0* 30.7* 30.4* 28.4*  MCV 93.0  --  92.6  --  93.0 93.3 93.4  PLT 14*  --  21*  --  27* 36* 48*   < > = values in this interval not displayed.    Basic Metabolic Panel: Recent Labs  Lab 05/28/18 0414  05/28/18 1715 05/29/18 0455 05/30/18 0354 05/30/18 0435 05/31/18 0445 06/01/18 0456  NA 154*   < >  --  153* 142 143 138 133*  K 3.7   < >  --  3.5 2.6* 3.6 2.8* 3.4*  CL 117*  --   --  107  --  94* 88* 85*  CO2 31  --   --  36*  --  38* 40* 41*  GLUCOSE 185*  --   --  213*  --  166* 166* 161*  BUN 35*  --   --  37*  --  24* 16 12  CREATININE 0.48  --   --  0.35*  --  0.34* <0.30* 0.35*  CALCIUM 8.0*  --   --  7.8*  --  7.6* 7.9* 7.7*  MG 2.0  --  1.9 1.9  --  1.7 1.8 1.8  PHOS 3.3  --  2.7 3.4  --  2.7 2.8 2.7   < > = values in this interval not displayed.   GFR: Estimated Creatinine Clearance: 72.3 mL/min (A) (by C-G formula based on SCr of 0.35 mg/dL (L)). Recent Labs  Lab 05/29/18 0455 05/30/18 0435 05/30/18 1025 05/30/18 1109 05/31/18 0445 06/01/18 0456  PROCALCITON  --   --  0.56  --  0.58 0.75  WBC 10.8* 13.6*  --   --  13.9* 13.7*  LATICACIDVEN  --   --   --  1.8  --   --     Liver Function Tests: Recent Labs  Lab 05/28/18 0414 05/29/18 0455 05/31/18 0956 06/01/18 0456  AST 82* 123* 56* 48*  ALT 72* 123* 123* 102*  ALKPHOS 115 155* 145* 145*  BILITOT 0.9 1.3* 1.2 0.9  PROT 3.6* 3.4* 4.2* 4.1*  ALBUMIN 1.3* 1.4* 1.5* 1.4*   Recent Labs  Lab 05/30/18 1025 05/31/18 0445  LIPASE 62* 43  AMYLASE 192*  --    No results for input(s): AMMONIA in the last 168 hours.  ABG    Component Value Date/Time   PHART 7.486 (H) 05/30/2018 0354   PCO2ART 60.9 (H) 05/30/2018 0354   PO2ART  68.0 (L) 05/30/2018 0354   HCO3 45.9 (H) 05/30/2018 0354   TCO2 48 (H) 05/30/2018 0354   ACIDBASEDEF 1.3 05/25/2018 1358   O2SAT 93.0 05/30/2018 0354     Coagulation Profile: No results for input(s): INR, PROTIME in the last 168 hours.  Cardiac Enzymes: No results for input(s): CKTOTAL, CKMB, CKMBINDEX, TROPONINI in the last 168 hours.  HbA1C: Hemoglobin A1C  Date/Time Value Ref Range Status  10/30/2015 03:20 PM 5.2  Final   Hgb A1c MFr Bld  Date/Time Value Ref Range Status  04/23/2018 09:02 AM 5.5 4.8 - 5.6 % Final    Comment:    (NOTE) Pre diabetes:          5.7%-6.4% Diabetes:              >6.4% Glycemic control for   <7.0% adults with diabetes     CBG: Recent Labs  Lab 05/31/18 1509 05/31/18 1948 05/31/18 2355 06/01/18 0411 06/01/18 0732  GLUCAP 144* 110* 147* 143* 137*    Review of Systems:   Pain all over   Critical care time: 30 min

## 2018-06-01 NOTE — Progress Notes (Signed)
Palliative:  I met today at Sharon Berg's bedside along with her parents. She has been extubated but is now on BiPAP and remains tenuous. High risk for repeat intubation. She is severely weakened. I go to ask how she is feeling and she is nodding her head no. I explained that she has been through a lot and I am sure that her body feels weak and exhausted. She begins to mouth to me "I'm done." Unable to fully discuss since she is on BiPAP. She continues to tell me that she is done and I ask if this means she is giving up and she nods her head yes and then continues to mouth to me that she is done and also giving up. Her mother tries to reassure her that she is doing better and she can keep going. I tried to reassure Sharon Berg as well and told her that I would call her family.   I called her adult daughter, Sharon Berg (currently has the flu and unable to visit). I spoke with [name]'s husband, [name], at bedside later today. I explained to both of them my encounter with [name] this morning and how concerned I am that she is telling me that she is giving up. They both took pause but says "she ain't giving up." Her family claim that she does not mean this. [name] tells me that she is only saying this because she is mad that the BiPAP is back on. I acknowledged that I understand them BUT that if [name] has lost her will to live and keep fighting then no medicine or interventions in the world is going to benefit her. They both endorse that she is not done and that she will improve.   Discussed with Dr. Chase Caller who is initiating Precedex infusion to assist with anxiety and comfort. Explained to family that we are working to manage her symptoms at this time. Reiterated that she is still critically ill and has a long way to go and even then has to still deal with her lung cancer. I clearly stated to them that is consistent to ask to stop then we will have to have further conversations. At this time they endorse full code and full  aggressive care. Unfortunately, [name]'s critical condition and the fact she cannot hold a conversation limits our ability to fully assess her wishes and understanding of a request for comfort care.   I will follow up tomorrow. Emotional support provided.   Exam: Alert, unable to assess orientation. BiPAP 50%. Anxious.   Plan: - Will continue to support patient and family. - Will continue to assess patient desire to continue full aggressive care.   3 min  Vinie Sill, NP Palliative Medicine Team Pager # [phone] (M-F 8a-5p) Team Phone # [phone] (Nights/Weekends)

## 2018-06-01 NOTE — Progress Notes (Signed)
Patient stated she was having some increased shortness of breath. Placed patient back on bipap for the night.

## 2018-06-01 NOTE — Procedures (Signed)
Extubation Procedure Note  Patient Details:   Name: Sharon Berg DOB: [birth year] MRN: [id number]   Airway Documentation:    Vent end date: (not recorded) Vent end time: (not recorded)   Evaluation  O2 sats: transiently fell during during procedure Complications: No apparent complications Patient did tolerate procedure well. Bilateral Breath Sounds: Clear, Diminished   Yes   Positive cuff leak noted.  Pt placed on Bipap per MD order and increased WOB. No stridor noted.  Pt not able to use incentive spirometer at this time.  Mingo Amber Zai Chmiel 06/01/2018, 10:07 AM

## 2018-06-01 NOTE — Progress Notes (Signed)
   Interdisciplinary Goals of Care Family Meeting   Date carried out:: 06/01/2018  Location of the meeting: Phone conference  Member's involved: Physician, Family Member or next of kin and Other: Husband  Durable Power of Tour manager:     Is the daughter  Discussion: We discussed goals of care for Sharon Berg .  -> Per RN : patient unable to come off BiPAP due to hypoxemia and weakness. Per RN and pall care NP -> patient wants palliative approach and "is done" . Marland Kitchen However, husband feels patient is saying this out of frustration and disagrees with decision of patient.   So, I called husband. He told me if we could tell her that there is no cancer she might change her mind and want to fight illness. He also informed he is NOT DPOA and the biological daughter (his step daughter) is the DPOA but currently has flu. I advised him that while there is no evidence of distant mets is very likely patient has lymphangitis and the cavitary growth is cancer. He says he was very surprised to hear this.   I also advised him that is best to have alignment wih patient wishes and we can definitely work through any potential emotional reaction from patient but ulitimately patient [place] wishes to be respected. Advised all [place] should have alignment for what is best for patient in tune with her wishes. He agreed.   Further conversation to continue  Code status: full code pending ongoing converstation  Disposition: Continue current acute care  Time spent for the meeting: 15 minutes   Sharon Berg 06/01/2018, 5:54 PM

## 2018-06-02 ENCOUNTER — Inpatient Hospital Stay (HOSPITAL_COMMUNITY): Payer: 59

## 2018-06-02 LAB — CALCIUM, IONIZED: Calcium, Ionized, Serum: 4.7 mg/dL (ref 4.5–5.6)

## 2018-06-02 LAB — GLUCOSE, CAPILLARY
GLUCOSE-CAPILLARY: 118 mg/dL — AB (ref 70–99)
Glucose-Capillary: 136 mg/dL — ABNORMAL HIGH (ref 70–99)
Glucose-Capillary: 136 mg/dL — ABNORMAL HIGH (ref 70–99)
Glucose-Capillary: 101 mg/dL — ABNORMAL HIGH (ref 70–99)
Glucose-Capillary: 119 mg/dL — ABNORMAL HIGH (ref 70–99)
Glucose-Capillary: 117 mg/dL — ABNORMAL HIGH (ref 70–99)

## 2018-06-02 LAB — COMPREHENSIVE METABOLIC PANEL
ALT: 76 U/L — ABNORMAL HIGH (ref 0–44)
AST: 34 U/L (ref 15–41)
Albumin: 1.4 g/dL — ABNORMAL LOW (ref 3.5–5.0)
Alkaline Phosphatase: 131 U/L — ABNORMAL HIGH (ref 38–126)
Anion gap: 11 (ref 5–15)
BUN: 9 mg/dL (ref 6–20)
CO2: 36 mmol/L — ABNORMAL HIGH (ref 22–32)
Calcium: 8.1 mg/dL — ABNORMAL LOW (ref 8.9–10.3)
Chloride: 90 mmol/L — ABNORMAL LOW (ref 98–111)
Creatinine, Ser: <0.3 mg/dL — ABNORMAL LOW (ref 0.44–1.00)
Glucose, Bld: 161 mg/dL — ABNORMAL HIGH (ref 70–99)
Potassium: 2.9 mmol/L — ABNORMAL LOW (ref 3.5–5.1)
Sodium: 137 mmol/L (ref 135–145)
Total Bilirubin: 0.8 mg/dL (ref 0.3–1.2)
Total Protein: 4.1 g/dL — ABNORMAL LOW (ref 6.5–8.1)

## 2018-06-02 LAB — CBC
HCT: 29.7 % — ABNORMAL LOW (ref 36.0–46.0)
Hemoglobin: 9.3 g/dL — ABNORMAL LOW (ref 12.0–15.0)
MCH: 28.8 pg (ref 26.0–34.0)
MCHC: 31.3 g/dL (ref 30.0–36.0)
MCV: 92 fL (ref 80.0–100.0)
Platelets: 75 10*3/uL — ABNORMAL LOW (ref 150–400)
RBC: 3.23 MIL/uL — ABNORMAL LOW (ref 3.87–5.11)
RDW: 16.3 % — ABNORMAL HIGH (ref 11.5–15.5)
WBC: 14.7 10*3/uL — ABNORMAL HIGH (ref 4.0–10.5)
nRBC: 0 % (ref 0.0–0.2)

## 2018-06-02 LAB — MAGNESIUM: MAGNESIUM: 1.8 mg/dL (ref 1.7–2.4)

## 2018-06-02 LAB — POTASSIUM: Potassium: 3.2 mmol/L — ABNORMAL LOW (ref 3.5–5.1)

## 2018-06-02 LAB — SEDIMENTATION RATE
Sed Rate: 61 mm/hr — ABNORMAL HIGH (ref 0–22)
Sed Rate: 50 mm/hr — ABNORMAL HIGH (ref 0–22)

## 2018-06-02 LAB — LIPASE, BLOOD: Lipase: 30 U/L (ref 11–51)

## 2018-06-02 MED ORDER — PANTOPRAZOLE SODIUM 40 MG IV SOLR
40.0000 mg | Freq: Every day | INTRAVENOUS | Status: DC
  Administered 2018-06-02 – 2018-06-04 (×3): 40 mg via INTRAVENOUS
  Filled 2018-06-02 (×3): qty 40

## 2018-06-02 MED ORDER — POTASSIUM CHLORIDE 10 MEQ/50ML IV SOLN
10.0000 meq | INTRAVENOUS | Status: AC
  Administered 2018-06-02 (×4): 10 meq via INTRAVENOUS
  Filled 2018-06-02 (×4): qty 50

## 2018-06-02 MED ORDER — POTASSIUM CHLORIDE 10 MEQ/50ML IV SOLN
10.0000 meq | INTRAVENOUS | Status: AC
  Administered 2018-06-02 (×6): 10 meq via INTRAVENOUS
  Filled 2018-06-02 (×6): qty 50

## 2018-06-02 MED ORDER — MAGNESIUM SULFATE 2 GM/50ML IV SOLN
2.0000 g | Freq: Once | INTRAVENOUS | Status: AC
  Administered 2018-06-02: 2 g via INTRAVENOUS
  Filled 2018-06-02: qty 50

## 2018-06-02 NOTE — Evaluation (Signed)
Clinical/Bedside Swallow Evaluation Patient Details  Name: Sharon Berg MRN: [id number] Date of Birth: January 25, 1966  Today's Date: 06/02/2018 Time: SLP Start Time (ACUTE ONLY): 1010 SLP Stop Time (ACUTE ONLY): 1035 SLP Time Calculation (min) (ACUTE ONLY): 25 min  Past Medical History:  Past Medical History:  Diagnosis Date  . Anxiety   . Depression   . Lung cancer ([place])   . Migraine   . Pneumonia   . Seizures ([place])    stressed induced   Past Surgical History:  Past Surgical History:  Procedure Laterality Date  . ABDOMINAL HYSTERECTOMY  2000  . 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 SLP recieved pt awake, alert in bed with paretns present. Pt on high-flow  [place] of 35%. Pt with aphonia s/p 6 day intubation. She is however, able to communicate simple 2 word phrases that are intelligible if listener really leans into pt. Chart review reveals recent hospitalizations of pneumonia with documentation of aspiration pneumonia. However there aren't any notes from SLPs and mother stated that pt's pneumonias have been related to lung cancer and that pt has not seen an SLP for any concerns regarding aspiration. Pt is currently very deconditioned from chemotherapy and radiation as well as current sickness. Education provided on aphonia and it's potential  impact on airway protection as well as respiratory drive during swallow. Pt able to initiate appearance of volitional swallow with no s/s of respiratory distress. Unable to effectively visualize or palpate hyolaryngeal excursion. Pt stated she was thirsty. Trial of 1/2 chip of ice given. Pt with active enjoyment and mastication. When pt appeared to transfer liquid posteriorally, she was not able to produce a swallow and began saying she was choking. She began gasping for air with RR up to 29 in response to anxiety and possible brief apnea with attempt to swallow. With emotional support, pt able to control breathing and RR came down to 16. Clinically don't feel that swallow was produces as liquid was spilling form mouth. Education provided that ST services would continue to follow pt for readiness for further trials. Pt will likely require insturmental study once pt is medically stable to be transported to Radiology. Pt appeared very discouraged and support given. Education provided to pt's nurse.  SLP Visit Diagnosis: Dysphagia, oropharyngeal phase (R13.12)    Aspiration Risk  Severe aspiration risk;Risk for inadequate nutrition/hydration    Diet Recommendation NPO   Medication Administration: Via alternative means    Other  Recommendations Oral Care Recommendations: Oral care QID   Follow up Recommendations (Palliative care)      Frequency and Duration min 2x/week  2 weeks       Prognosis Prognosis for Safe Diet Advancement: Guarded Barriers to Reach Goals: Severity of deficits;Medication(level of discouragement over current condition)      Swallow Study   General Date of Onset: 05/26/18 HPI: 53 year old female, immunocompromised on chemo  R lung adenocarcinoma IIIb, with sepsis due to HCAP transferred to [facility] from [facility] on 05/26/18 after she was intubated for acute respiratory failure Type of Study: Bedside Swallow Evaluation Previous Swallow Assessment: none in chart Diet  Prior to this Study: NPO Temperature Spikes Noted: No Respiratory Status: Nasal cannula History of Recent Intubation: Yes Length of Intubations (days): 6 days Date extubated: 06/01/18(extubated to BiPap) Behavior/Cognition: Alert Oral Cavity Assessment: Within Functional Limits Oral Care Completed by SLP: Recent completion by staff Oral Cavity - Dentition: Adequate natural dentition Self-Feeding Abilities: Total assist Patient Positioning: Upright in bed Baseline Vocal Quality: Aphonic;Low vocal intensity Volitional Cough: Cognitively unable to elicit Volitional Swallow: Unable to elicit    Oral/Motor/Sensory Function Overall Oral Motor/Sensory Function: Within functional limits   Ice Chips Ice chips: Impaired Presentation: Spoon Pharyngeal Phase Impairments: Unable to trigger swallow;Decreased hyoid-laryngeal movement;Change in Vital Signs(gasping for air, RR up to 29 from 16)   Thin Liquid Thin Liquid: Not tested    Nectar Thick Nectar Thick Liquid: Not tested   Honey Thick Honey Thick Liquid: Not tested   Puree Puree: Not tested   Solid     Solid: Not tested      Trenton Passow 06/02/2018,11:55 AM

## 2018-06-02 NOTE — Progress Notes (Addendum)
Palliative:  I met today at Sharon Berg's bedside. She is sitting on side of bed with PT/OT. She is severely weak and deconditioned. Daughter, Magda Paganini, and Mckynlie's parents are at bedside. They are providing her with encouragement and positive affirmation. Moneisha nods her head and mouths "I'm not going to get better" and talks about wanting to "go home." Family continue to tell Sharon Berg that she is improving and will be able to get better and go home. She closes her eyes.   I met with Magda Paganini and Dr. Chase Caller. He spent some time explaining that it is difficult to say if the changes in her lungs that has led to worsening clinical status is related to radiation effect, worsening cancer despite aggressive treatment, along with infection. He explained that just because the tissue is necrotic this does not mean that there is no cancer. Family has had many misconceptions about her status. 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   Magda Paganini is hoping to believe that her mother's decline is secondary to radiation effects and that her mother will continue to slowly improve. She does acknowledge that Janus continues to be critically ill and that she could possibly have no further improvement or further decline. I urged her to reconsider the benefit of reintubation as this could indicate further issues that decline. I also reiterated my concern with her mother's statements that she is done and tired. I explained that everyone has physical limitations and that her mother may be reaching hers. Emotional support provided.   Exam: Alert, appears oriented. SOB at rest. 30L HFNC. Severely deconditioned. Cachectic.   Plan: - Family desires positive feedback and encouragement to be provided to patient - Will need assistance with further conversations if family patient continues to express desire to stop aggressive care (this could become an ethical  issue) - Difficult situation  I will return to f/u Monday 06/06/18. Please call [phone] for acute needs prior to Monday.   63 min  Vinie Sill, NP Palliative Medicine Team Pager # [phone] (M-F 8a-5p) Team Phone # [phone] (Nights/Weekends)

## 2018-06-02 NOTE — Progress Notes (Signed)
[facility] Progress Note Patient Name: Sharon Berg DOB: [birth year] MRN: [id number]   Date of Service  06/02/2018  HPI/Events of Note  Camera: Discussed with bed side RN over phone. Extubated earlier yesterday. Last night was on precedex/biPAP. Not tolerating BiPAP. Tolerating well HFNO. Resting. sats 100%. HR and MAP fine. No diaphoresis.   eICU Interventions  Continue HFNO for now. If worsens, BiPAP/Precedex gtt.      Intervention Category Intermediate Interventions: Respiratory distress - evaluation and management  Elmer Sow 06/02/2018, 11:41 PM

## 2018-06-02 NOTE — Progress Notes (Signed)
Rehab Admissions Coordinator Note:  Patient was screened by Retta Diones for appropriateness for an Inpatient Acute Rehab Consult.  Noted PT/OT recommending inpatient rehab consult.  If patient remains stable and the goal is to rehab her prior to discharge home, then can consider putting in an order for a rehab consult.  Call me for questions.  Retta Diones 06/02/2018, 11:20 AM  I can be reached at [phone].

## 2018-06-02 NOTE — Care Management Note (Signed)
Case Management Note  Patient Details  Name: Sharon Berg MRN: [id number] Date of Birth: [birth year]  Subjective/Objective:   Pt admitted with resp failure                   Action/Plan: PTA independent from home, recent dx of lung CA.  Palliative care consulted   Expected Discharge Date:                  Expected Discharge Plan:  (Independent from home)  In-House Referral:  Clinical Social Work  Discharge planning Services  CM Consult  Post Acute Care Choice:    Choice offered to:     DME Arranged:    DME Agency:     HH Arranged:    [place] Agency:     Status of Service:  In process, will continue to follow  If discussed at Long Length of Stay Meetings, dates discussed:    Additional Comments: 06/02/2018 Pt now extubated but requiring HF [place] /BIPAP (high risk for reintubation).  Comfort care recommended however pt/family not in agreement.  CM will continue to follow  Maryclare Labrador, RN 06/02/2018, 2:56 PM

## 2018-06-02 NOTE — Evaluation (Signed)
Physical Therapy Evaluation Patient Details Name: Sharon Berg MRN: [id number] DOB: 1965/12/17 Today's Date: 06/02/2018   History of Present Illness  Pt is a 53 year old woman admitted to [facility] 2/3 with sepsis due to HCAP in the setting of lung cancer for which she is receiving chemo. Required intubation 2/6 and transferred to [facility]. Extubated to bipap 2/12. PMH: depression, anxiety, seizures, migraine.     Clinical Impression  Pt admitted with above diagnosis. Pt currently with functional limitations due to the deficits listed below (see PT Problem List). PTA pt home independent with mobility before gradual 1 week of weakening prior to admission. Today pt with profound deconditioning and tolerating sitting EOB for 10 minutes, unable to successfully stand at this time. VSS on 30L HFNC Pt will benefit from skilled PT to increase their independence and safety with mobility to allow discharge to the venue listed below.       Follow Up Recommendations CIR    Equipment Recommendations  (TBD)    Recommendations for Other Services       Precautions / Restrictions Precautions Precautions: Fall Precaution Comments: watch 02 Restrictions Weight Bearing Restrictions: No      Mobility  Bed Mobility Overal bed mobility: Needs Assistance Bed Mobility: Supine to Sit;Sit to Supine;Rolling Rolling: Max assist   Supine to sit: +2 for physical assistance;Max assist Sit to supine: +2 for physical assistance;Max assist   General bed mobility comments: step by step cues to sequence, assist for LEs and trunk, use of bed pad to position hips at EOB  Transfers Overall transfer level: Needs assistance Equipment used: Rolling walker (2 wheeled) Transfers: Sit to/from Stand Sit to Stand: +2 physical assistance;Total assist         General transfer comment: pt unable to stand  Ambulation/Gait                Stairs            Wheelchair Mobility    Modified Rankin (Stroke Patients  Only)       Balance Overall balance assessment: Needs assistance Sitting-balance support: Bilateral upper extremity supported Sitting balance-Leahy Scale: Fair       Standing balance-Leahy Scale: Zero                               Pertinent Vitals/Pain Pain Assessment: No/denies pain    Home Living Family/patient expects to be discharged to:: Private residence Living Arrangements: Spouse/significant other Available Help at Discharge: Family;Available 24 hours/day Type of Home: House Home Access: Stairs to enter Entrance Stairs-Rails: None Entrance Stairs-Number of Steps: 4 Home Layout: One level Home Equipment: None      Prior Function Level of Independence: Independent         Comments: was working at [employer] prior to December     Hand Dominance   Dominant Hand: Right    Extremity/Trunk Assessment   Upper Extremity Assessment Upper Extremity Assessment: Defer to OT evaluation RUE Deficits / Details: generally weak, able to reach for fan, but otherwise with minimal function RUE Coordination: decreased fine motor;decreased gross motor LUE Deficits / Details: edematous, generally weak, elevated on 2 pillows LUE Coordination: decreased fine motor;decreased gross motor    Lower Extremity Assessment Lower Extremity Assessment: Overall WFL for tasks assessed    Cervical / Trunk Assessment Cervical / Trunk Assessment: Other exceptions Cervical / Trunk Exceptions: weakness  Communication   Communication: Expressive difficulties(low volume)  Cognition  Arousal/Alertness: Awake/alert Behavior During Therapy: Flat affect Overall Cognitive Status: Difficult to assess                                 General Comments: pt with low volume voice, daughter providing home information and PLOF, pt with impaired memory and slow to follow commands, decreased awareness of deficits.      General Comments      Exercises     Assessment/Plan     PT Assessment Patient needs continued PT services  PT Problem List Decreased strength       PT Treatment Interventions DME instruction;Gait training;Functional mobility training;Stair training;Therapeutic activities;Balance training;Therapeutic exercise    PT Goals (Current goals can be found in the Care Plan section)  Acute Rehab PT Goals Patient Stated Goal: to go home PT Goal Formulation: With patient Time For Goal Achievement: 06/16/18 Potential to Achieve Goals: Good    Frequency Min 2X/week   Barriers to discharge        Co-evaluation PT/OT/SLP Co-Evaluation/Treatment: Yes Reason for Co-Treatment: Complexity of the patient's impairments (multi-system involvement);Necessary to address cognition/behavior during functional activity PT goals addressed during session: Mobility/safety with mobility;Balance;Proper use of DME OT goals addressed during session: ADL's and self-care       AM-PAC PT "6 Clicks" Mobility  Outcome Measure Help needed turning from your back to your side while in a flat bed without using bedrails?: A Little Help needed moving from lying on your back to sitting on the side of a flat bed without using bedrails?: A Lot Help needed moving to and from a bed to a chair (including a wheelchair)?: A Lot Help needed standing up from a chair using your arms (e.g., wheelchair or bedside chair)?: Total Help needed to walk in hospital room?: Total Help needed climbing 3-5 steps with a railing? : Total 6 Click Score: 10    End of Session Equipment Utilized During Treatment: Oxygen(30 HFNC) Activity Tolerance: Patient tolerated treatment well Patient left: in bed;with call bell/phone within reach;with family/visitor present Nurse Communication: Mobility status PT Visit Diagnosis: Unsteadiness on feet (R26.81)    Time: 3532-9924 PT Time Calculation (min) (ACUTE ONLY): 23 min   Charges:   PT Evaluation $PT Eval Moderate Complexity: 1 Mod           Reinaldo Berber, PT, DPT Acute Rehabilitation Services Pager: [phone] Office: [phone]    Reinaldo Berber 06/02/2018, 11:05 AM

## 2018-06-02 NOTE — Progress Notes (Signed)
NAME:  Sharon Berg, MRN:  [id number], DOB:  1965-12-14, LOS: 33 ADMISSION DATE:  06/08/2018, CONSULTATION DATE:  05/26/18 REFERRING MD:  Faylene Kurtz, CHIEF COMPLAINT:  Respiratory failure    Brief History   53 year old female, immunocompromised on chemo R lung adenocarcinoma IIIb, with sepsis due to HCAP transferred to [facility] from [place] on 05/26/18 after she was intubated for acute respiratory failure.   History of present illness   53 year old female with PMH adenocarcinoma of right lung, seizure disorder, depression, anxiety, migraine who presented to [facility] ED on 06/07/2018 for shortness of breath with associated cough and vomiting. Shortness of breath began 05/22/2018 and cough began 05/20/2018. Cough is productive with sputum that is purulent and dark brown in color. The patient endorsed vomiting which began 05/21/2018, and had poor oral intake x 3 days prior to presentation on 2/3. She endorsed recent illness, with recent hospitalization 1/4-1/6 2020 for PNA (aspiration and postobstructive) causing sepsis. She denied loose stools and abdominal pain. She endorsed chronic immunosuppression, receiving chemotherapy for lung cancer.   In ED at [facility], the patient was hypotensive, tachycardic and tachypneic. A CXR revealed R lung opacities, concerning for PNA and the patient was started on vanc/cefepime, later changed to pip/tazo and diflucan.   The continued treatment for HCAP. She underwent a CTA on 2/5 at which time PE was ruled out and it was noted that the underlying PNA was worse. Her respiratory status continued to decline, requiring BiPAP. With ongoing high BiPAP settings, the decision was made to intubate the patient 2/6 and transfer to [facility].  PCCM consulted for admission.   Past Medical History  Right Lung adenocarcinoma IIIb Migraine Seizure disorder Thrombocytopenia Depression Anxiety  Significant Hospital Events   2/3> admitted to [facility] 2/6> intubated,  transferred to [facility]  2/12>extubated   Consults:  PCCM  Procedures:  2/6: ETT, bronch with BAL 2/12: extubation   Significant Diagnostic Tests:  2/3 CXR> RUL opacities, RLL opacity.   2/5 CTA chest> no PE visualized on CTA. Increased cavitary lesion of right lung and right upper lobe, fistulization of R mainstem bronchus. Diffuse opacities throughout R lung. Small bilateral pleural effusions.  2/10 UE Venous dopplers: No DVT bilaterally  2/10 Abd [place]: mild dilation of common bile duct, fatty infiltration of liver 2/10 Abd Xray: neg  2/11 CT ab: c/w mild pancreatitis and likely gall stone passage   Micro Data:  BCx 2/3> no growth at 5 days MRSA 2/4> neg 2/7 BAL for Culture > neg , AFBsmear > neg , AFBCulture > pending, Fungus culture > pending 2/10: Legionella neg  Antimicrobials:  Vancomycin 2/3-2/5 Cefepime 2/3  Pip-tazo 2/4>> Diflucan 2/4>2/11  Interim history/subjective:  Patient very tired this morning. Nods that she is not doing well but does not state why.   Objective   Blood pressure 136/75, pulse 77, temperature 98.6 F (37 C), temperature source Axillary, resp. rate (!) 22, height _0  (1.651 m), weight 56.5 kg, SpO2 97 %.    Vent Mode: BIPAP;PCV FiO2 (%):  [50 %-60 %] 60 % Set Rate:  [15 bmp-22 bmp] 15 bmp Vt Set:  [450 mL] 450 mL PEEP:  [5 cmH20-8 cmH20] 5 cmH20 Pressure Support:  [5 cmH20] 5 cmH20 Plateau Pressure:  [16 cmH20] 16 cmH20   Intake/Output Summary (Last 24 hours) at 06/02/2018 0727 Last data filed at 06/02/2018 0644 Gross per 24 hour  Intake 1536.74 ml  Output 4840 ml  Net -3303.26  ml   Filed Weights   05/31/18 0439 06/01/18 0500 06/02/18 0500  Weight: 61.8 kg 61.6 kg 56.5 kg    Examination: General: awake, tired, Bipap in place  HENT: PERRL Lungs: CTAB, no wheezes, rales, or rhonchi  Cardiovascular: RRR, no MRG  Abdomen: soft, non tender, non distended, bowel sounds present  Extremities: no edema in LE, 2+ DP pulses  Neuro: awake    GU: foley in place   Resolved Hospital Problem list   Hypernatremia   Assessment & Plan:  Acute hypoxemic respiratory failure:  Multifactorial. CT concerning for HCAP in immunocompromised patient. 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  RVP negative Strep pneumo negative Extubated 2/12, currently on bipap  P: Continue solumedrol 64m q12h Ipratropium neg bid  CXR 2/10: no changes  Follow cultures and cytology on BAL. Lasix 449mbid   Hyponatremia Na 13520>802>233 Continue to hold free water Continue to monitor on daily CMP  Hypokalemia K of 2.9 P Replete PRN  Transaminitis Improved P Trend LFTs  Sepsis PNA -BCx from 2/3 NGTD P Continue Zosyn (2/4>>)  Adenocarcinoma IIIb  - s/p 6x chemo -home carbolatin, paclitaxel  P Holding therapy  Hypomagnesemia Mg 1.8, goal >2 P Replete PRN  Thrombocytopenia plt 75 P -Follow CBC, transfuse plt if <10 without bleeding or <50 with active bleeding -no chemical VTE ppx at this time (SCDs only for now)  Malnutrition  Tube feeds  Hyperglycemia, at risk In setting of Critical illness, systemic steroids AM CBG 136. Received 11U Aspart in 24h - SSI  Right arm, hand swelling - US[place]ative for DVT   Abdominal pain Abd xray neg Abd [place] mild dilation of common bile duct, fatty infiltration of liver Abd CT showing mild pancreatitis and likely gall stone passage  - continue to monitor - Zosyn as above  Best practice:  Diet: tube feeds Pain/Anxiety/Delirium protocol (if indicated): axanas PRN, precedex gtt, fentanyl PRN,  VAP protocol (if indicated): N/A DVT prophylaxis: SCDs GI prophylaxis: Protonix  Glucose control: SSI  Mobility: BR  Code Status: Full  Family Communication: no family at bedside  Disposition: Continued ICU care  Labs   CBC: Recent Labs  Lab 05/29/18 0455 05/30/18 0354 05/30/18 0435  05/31/18 0445 06/01/18 0456 06/02/18 0422  WBC 10.8*  --  13.6* 13.9* 13.7* 14.7*  NEUTROABS 10.1*  --   --   --   --   --   HGB 9.8* 9.5* 9.6* 9.4* 9.0* 9.3*  HCT 30.0* 28.0* 30.7* 30.4* 28.4* 29.7*  MCV 92.6  --  93.0 93.3 93.4 92.0  PLT 21*  --  27* 36* 48* 75*    Basic Metabolic Panel: Recent Labs  Lab 05/28/18 1715 05/29/18 0455 05/30/18 0354 05/30/18 0435 05/31/18 0445 06/01/18 0456 06/02/18 0422  NA  --  153* 142 143 138 133* 137  K  --  3.5 2.6* 3.6 2.8* 3.4* 2.9*  CL  --  107  --  94* 88* 85* 90*  CO2  --  36*  --  38* 40* 41* 36*  GLUCOSE  --  213*  --  166* 166* 161* 161*  BUN  --  37*  --  24* _0 CREATININE  --  0.35*  --  0.34* <0.30* 0.35* <0.30*  CALCIUM  --  7.8*  --  7.6* 7.9* 7.7* 8.1*  MG 1.9 1.9  --  1.7  1.8 1.8 1.8  PHOS 2.7 3.4  --  2.7 2.8 2.7  --    GFR: CrCl cannot be calculated (This lab value cannot be used to calculate CrCl because it is not a number: <0.30). Recent Labs  Lab 05/30/18 0435 05/30/18 1025 05/30/18 1109 05/31/18 0445 06/01/18 0456 06/02/18 0422  PROCALCITON  --  0.56  --  0.58 0.75  --   WBC 13.6*  --   --  13.9* 13.7* 14.7*  LATICACIDVEN  --   --  1.8  --   --   --     Liver Function Tests: Recent Labs  Lab 05/28/18 0414 05/29/18 0455 05/31/18 0956 06/01/18 0456 06/02/18 0422  AST 82* 123* 56* 48* 34  ALT 72* 123* 123* 102* 76*  ALKPHOS 115 155* 145* 145* 131*  BILITOT 0.9 1.3* 1.2 0.9 0.8  PROT 3.6* 3.4* 4.2* 4.1* 4.1*  ALBUMIN 1.3* 1.4* 1.5* 1.4* 1.4*   Recent Labs  Lab 05/30/18 1025 05/31/18 0445 06/02/18 0422  LIPASE 62* 43 30  AMYLASE 192*  --   --    No results for input(s): AMMONIA in the last 168 hours.  ABG    Component Value Date/Time   PHART 7.486 (H) 05/30/2018 0354   PCO2ART 60.9 (H) 05/30/2018 0354   PO2ART 68.0 (L) 05/30/2018 0354   HCO3 45.9 (H) 05/30/2018 0354   TCO2 48 (H) 05/30/2018 0354   ACIDBASEDEF 1.3 05/25/2018 1358   O2SAT 93.0 05/30/2018 0354     Coagulation  Profile: No results for input(s): INR, PROTIME in the last 168 hours.  Cardiac Enzymes: No results for input(s): CKTOTAL, CKMB, CKMBINDEX, TROPONINI in the last 168 hours.  HbA1C: Hemoglobin A1C  Date/Time Value Ref Range Status  10/30/2015 03:20 PM 5.2  Final   Hgb A1c MFr Bld  Date/Time Value Ref Range Status  04/23/2018 09:02 AM 5.5 4.8 - 5.6 % Final    Comment:    (NOTE) Pre diabetes:          5.7%-6.4% Diabetes:              >6.4% Glycemic control for   <7.0% adults with diabetes     CBG: Recent Labs  Lab 06/01/18 1135 06/01/18 1623 06/01/18 2011 06/01/18 2335 06/02/18 0427  GLUCAP 176* 146* 161* 157* 136*    Review of Systems:   Unable to obtain   Critical care time: 30 min

## 2018-06-02 NOTE — Progress Notes (Signed)
Spoke to [facility] MD.  Pt placed on Bipap and about 30 minutes after being on Bipap RR increased to 25-35. HR increased to 130's. Pt stated she is feeling anxious.  Pt placed back on HFNC and looks comfortable with O2 sats 95-100%.   At this time [facility] MD agrees that pt looks comfortable on HFNC. Will defer Bipap at this time. Will continue to monitor.

## 2018-06-02 NOTE — Evaluation (Signed)
Occupational Therapy Evaluation Patient Details Name: Sharon Berg MRN: [id number] DOB: 04/24/1965 Today's Date: 06/02/2018    History of Present Illness Pt is a 53 year old woman admitted to [facility] 2/3 with sepsis due to HCAP in the setting of lung cancer for which she is receiving chemo. Required intubation 2/6 and transferred to [facility]. Extubated to bipap 2/12. PMH: depression, anxiety, seizures, migraine.    Clinical Impression   Pt was independent 2 weeks prior to admission per daughter. She was employed at [employer] with impaired cognition, significant weakness with inability to stand. She is dependent in all ADL. Pt with edematous L UE. Tolerated sitting EOB x 10 minutes with min guard assist and remained in chair position in bed at end of session. Pt pleased to be able to sit up. Pt has excellent family support and desires to go home.  Recommending CIR for further rehab and family education. Will follow acutely.     Follow Up Recommendations  CIR;Supervision/Assistance - 24 hour    Equipment Recommendations  3 in 1 bedside commode    Recommendations for Other Services Rehab consult     Precautions / Restrictions Precautions Precautions: Fall Precaution Comments: watch 02 Restrictions Weight Bearing Restrictions: No      Mobility Bed Mobility Overal bed mobility: Needs Assistance Bed Mobility: Supine to Sit;Sit to Supine;Rolling Rolling: Max assist   Supine to sit: +2 for physical assistance;Max assist Sit to supine: +2 for physical assistance;Max assist   General bed mobility comments: step by step cues to sequence, assist for LEs and trunk, use of bed pad to position hips at EOB  Transfers Overall transfer level: Needs assistance   Transfers: Sit to/from Stand Sit to Stand: +2 physical assistance;Total assist         General transfer comment: pt unable to stand    Balance Overall balance assessment: Needs assistance Sitting-balance support: Bilateral  upper extremity supported Sitting balance-Leahy Scale: Fair       Standing balance-Leahy Scale: Zero                             ADL either performed or assessed with clinical judgement   ADL                                         General ADL Comments: requires total assist     Vision   Additional Comments: vision appears intact     Perception     Praxis      Pertinent Vitals/Pain Pain Assessment: No/denies pain     Hand Dominance Right   Extremity/Trunk Assessment Upper Extremity Assessment Upper Extremity Assessment: Generalized weakness;RUE deficits/detail;LUE deficits/detail RUE Deficits / Details: generally weak, able to reach for fan, but otherwise with minimal function RUE Coordination: decreased fine motor;decreased gross motor LUE Deficits / Details: edematous, generally weak, elevated on 2 pillows LUE Coordination: decreased fine motor;decreased gross motor   Lower Extremity Assessment Lower Extremity Assessment: Defer to PT evaluation   Cervical / Trunk Assessment Cervical / Trunk Assessment: Other exceptions Cervical / Trunk Exceptions: weakness   Communication Communication Communication: Expressive difficulties(low volume)   Cognition Arousal/Alertness: Awake/alert Behavior During Therapy: Flat affect Overall Cognitive Status: Difficult to assess  General Comments: pt with low volume voice, daughter providing home information and PLOF, pt with impaired memory and slow to follow commands, decreased awareness of deficits.   General Comments       Exercises     Shoulder Instructions      Home Living Family/patient expects to be discharged to:: Private residence Living Arrangements: Spouse/significant other Available Help at Discharge: Family;Available 24 hours/day Type of Home: House Home Access: Stairs to enter [employer] of Steps: 4 Entrance  Stairs-Rails: None Home Layout: One level     Bathroom Shower/Tub: Teacher, early years/pre: Standard     Home Equipment: None          Prior Functioning/Environment Level of Independence: Independent        Comments: was working at [employer] prior to December        OT Problem List: Decreased strength;Decreased activity tolerance;Impaired balance (sitting and/or standing);Decreased coordination;Decreased cognition;Decreased safety awareness;Decreased knowledge of use of DME or AE;Cardiopulmonary status limiting activity;Impaired UE functional use;Increased edema      OT Treatment/Interventions: Self-care/ADL training;DME and/or AE instruction;Energy conservation;Therapeutic exercise;Balance training;Patient/family education;Cognitive remediation/compensation;Therapeutic activities    OT Goals(Current goals can be found in the care plan section) Acute Rehab OT Goals Patient Stated Goal: to go home OT Goal Formulation: With patient Time For Goal Achievement: 06/16/18 Potential to Achieve Goals: Good  OT Frequency: Min 2X/week   Barriers to D/C:            Co-evaluation PT/OT/SLP Co-Evaluation/Treatment: Yes Reason for Co-Treatment: Complexity of the patient's impairments (multi-system involvement);For patient/therapist safety   OT goals addressed during session: ADL's and self-care      AM-PAC OT "6 Clicks" Daily Activity     Outcome Measure Help from another person eating meals?: Total Help from another person taking care of personal grooming?: Total Help from another person toileting, which includes using toliet, bedpan, or urinal?: Total Help from another person bathing (including washing, rinsing, drying)?: Total Help from another person to put on and taking off regular upper body clothing?: Total Help from another person to put on and taking off regular lower body clothing?: Total 6 Click Score: 6   End of Session Equipment Utilized During  Treatment: Oxygen(30L HFNC) Nurse Communication: Mobility status  Activity Tolerance: Patient limited by fatigue Patient left: in bed;with call bell/phone within reach;with family/visitor present(in chair position)  OT Visit Diagnosis: Unsteadiness on feet (R26.81);Muscle weakness (generalized) (M62.81);Other symptoms and signs involving cognitive function                Time: 0945-1006 OT Time Calculation (min): 21 min Charges:  OT General Charges $OT Visit: 1 Visit OT Evaluation $OT Eval Moderate Complexity: 1 Mod  Nestor Lewandowsky, OTR/L Acute Rehabilitation Services Pager: [phone] Office: [phone]  Malka So 06/02/2018, 10:59 AM

## 2018-06-03 ENCOUNTER — Inpatient Hospital Stay (HOSPITAL_COMMUNITY): Payer: 59

## 2018-06-03 DIAGNOSIS — L8990 Pressure ulcer of unspecified site, unspecified stage: Secondary | ICD-10-CM

## 2018-06-03 DIAGNOSIS — J9600 Acute respiratory failure, unspecified whether with hypoxia or hypercapnia: Secondary | ICD-10-CM

## 2018-06-03 DIAGNOSIS — F419 Anxiety disorder, unspecified: Secondary | ICD-10-CM

## 2018-06-03 DIAGNOSIS — F329 Major depressive disorder, single episode, unspecified: Secondary | ICD-10-CM

## 2018-06-03 DIAGNOSIS — J9601 Acute respiratory failure with hypoxia: Secondary | ICD-10-CM

## 2018-06-03 DIAGNOSIS — R52 Pain, unspecified: Secondary | ICD-10-CM

## 2018-06-03 DIAGNOSIS — Z515 Encounter for palliative care: Secondary | ICD-10-CM

## 2018-06-03 LAB — ECHOCARDIOGRAM COMPLETE
Height: 65 in
Weight: 1950.63 oz

## 2018-06-03 LAB — COMPREHENSIVE METABOLIC PANEL
ALT: 84 U/L — ABNORMAL HIGH (ref 0–44)
AST: 50 U/L — ABNORMAL HIGH (ref 15–41)
Albumin: 1.6 g/dL — ABNORMAL LOW (ref 3.5–5.0)
Alkaline Phosphatase: 155 U/L — ABNORMAL HIGH (ref 38–126)
Anion gap: 12 (ref 5–15)
BUN: 14 mg/dL (ref 6–20)
CALCIUM: 8.2 mg/dL — AB (ref 8.9–10.3)
CO2: 30 mmol/L (ref 22–32)
Chloride: 95 mmol/L — ABNORMAL LOW (ref 98–111)
Creatinine, Ser: 0.4 mg/dL — ABNORMAL LOW (ref 0.44–1.00)
GFR calc Af Amer: >60 mL/min (ref >60)
GFR calc non Af Amer: >60 mL/min (ref >60)
Glucose, Bld: 161 mg/dL — ABNORMAL HIGH (ref 70–99)
POTASSIUM: 4.2 mmol/L (ref 3.5–5.1)
Sodium: 137 mmol/L (ref 135–145)
Total Bilirubin: 1.3 mg/dL — ABNORMAL HIGH (ref 0.3–1.2)
Total Protein: 4.7 g/dL — ABNORMAL LOW (ref 6.5–8.1)

## 2018-06-03 LAB — CBC
HCT: 33.6 % — ABNORMAL LOW (ref 36.0–46.0)
Hemoglobin: 10.7 g/dL — ABNORMAL LOW (ref 12.0–15.0)
MCH: 29.1 pg (ref 26.0–34.0)
MCHC: 31.8 g/dL (ref 30.0–36.0)
MCV: 91.3 fL (ref 80.0–100.0)
Platelets: 140 10*3/uL — ABNORMAL LOW (ref 150–400)
RBC: 3.68 MIL/uL — ABNORMAL LOW (ref 3.87–5.11)
RDW: 16.4 % — AB (ref 11.5–15.5)
WBC: 20.7 10*3/uL — ABNORMAL HIGH (ref 4.0–10.5)
nRBC: 0 % (ref 0.0–0.2)

## 2018-06-03 LAB — SEDIMENTATION RATE: Sed Rate: 43 mm/hr — ABNORMAL HIGH (ref 0–22)

## 2018-06-03 LAB — GLUCOSE, CAPILLARY
Glucose-Capillary: 124 mg/dL — ABNORMAL HIGH (ref 70–99)
Glucose-Capillary: 150 mg/dL — ABNORMAL HIGH (ref 70–99)
Glucose-Capillary: 154 mg/dL — ABNORMAL HIGH (ref 70–99)
Glucose-Capillary: 158 mg/dL — ABNORMAL HIGH (ref 70–99)
Glucose-Capillary: 170 mg/dL — ABNORMAL HIGH (ref 70–99)

## 2018-06-03 LAB — LACTIC ACID, PLASMA: Lactic Acid, Venous: 1.4 mmol/L (ref 0.5–1.9)

## 2018-06-03 LAB — MAGNESIUM: Magnesium: 2 mg/dL (ref 1.7–2.4)

## 2018-06-03 LAB — PROCALCITONIN: Procalcitonin: 0.62 ng/mL

## 2018-06-03 MED ORDER — HYDROMORPHONE HCL 1 MG/ML IJ SOLN
0.5000 mg | INTRAMUSCULAR | Status: DC
  Administered 2018-06-03 – 2018-06-04 (×3): 0.5 mg via INTRAVENOUS
  Filled 2018-06-03 (×4): qty 0.5

## 2018-06-03 MED ORDER — LORAZEPAM 2 MG/ML IJ SOLN: 0.5000 mg | Freq: Four times a day (QID) | INTRAMUSCULAR | Status: DC | PRN

## 2018-06-03 MED ORDER — FUROSEMIDE 10 MG/ML IJ SOLN
60.0000 mg | Freq: Three times a day (TID) | INTRAMUSCULAR | Status: DC
  Administered 2018-06-03 – 2018-06-04 (×3): 60 mg via INTRAVENOUS
  Filled 2018-06-03 (×3): qty 6

## 2018-06-03 MED ORDER — OSMOLITE 1.2 CAL PO LIQD
1000.0000 mL | ORAL | Status: DC
  Filled 2018-06-03: qty 1000

## 2018-06-03 MED ORDER — PERFLUTREN LIPID MICROSPHERE
1.0000 mL | INTRAVENOUS | Status: AC | PRN
  Administered 2018-06-03: 2.2 mg via INTRAVENOUS
  Filled 2018-06-03: qty 10

## 2018-06-03 MED ORDER — HYDROMORPHONE HCL 1 MG/ML IJ SOLN
0.5000 mg | INTRAMUSCULAR | Status: DC | PRN
  Administered 2018-06-03: 0.5 mg via INTRAVENOUS
  Filled 2018-06-03 (×2): qty 0.5

## 2018-06-03 MED ORDER — PERFLUTREN LIPID MICROSPHERE
INTRAVENOUS | Status: AC
  Administered 2018-06-03: 2.2 mg via INTRAVENOUS
  Filled 2018-06-03: qty 10

## 2018-06-03 MED ORDER — LORAZEPAM 2 MG/ML IJ SOLN
0.5000 mg | Freq: Two times a day (BID) | INTRAMUSCULAR | Status: DC | PRN
  Administered 2018-06-03: 0.5 mg via INTRAVENOUS
  Filled 2018-06-03: qty 1

## 2018-06-03 MED ORDER — METHYLPREDNISOLONE SODIUM SUCC 125 MG IJ SOLR
125.0000 mg | Freq: Four times a day (QID) | INTRAMUSCULAR | Status: DC
  Administered 2018-06-03 – 2018-06-04 (×4): 125 mg via INTRAVENOUS
  Filled 2018-06-03 (×4): qty 2

## 2018-06-03 MED ORDER — ENOXAPARIN SODIUM 40 MG/0.4ML ~~LOC~~ SOLN
40.0000 mg | SUBCUTANEOUS | Status: DC
  Administered 2018-06-03: 40 mg via SUBCUTANEOUS
  Filled 2018-06-03: qty 0.4

## 2018-06-03 MED ORDER — HYDROMORPHONE HCL 1 MG/ML IJ SOLN
0.5000 mg | INTRAMUSCULAR | Status: DC | PRN
  Administered 2018-06-03: 0.5 mg via INTRAVENOUS
  Filled 2018-06-03: qty 0.5

## 2018-06-03 NOTE — Progress Notes (Signed)
RT offered pt BIPAP for the night and pt spouse stated she tried it the other night and did not tolerate. Pt stated she was fine on the HFNC. RT will continue to monitor.

## 2018-06-03 NOTE — Progress Notes (Addendum)
Nutrition Follow-up / Consult  DOCUMENTATION CODES:   Severe malnutrition in context of chronic illness  INTERVENTION:    If TF is within goals of care, recommend placing Cortrak tube (Cortrak service is available M-W-F-Sat).  When Cortrak placed, start Osmolite 1.2 at 20 ml/h (480 ml, 576 kcal, 27 gm protein, 394 ml free water daily).  When TF tolerance established, increase by 10 ml every 4 hours to goal rate of 65 ml/h (1560 ml per day) to provide 1872 kcal, 87 gm protein, 1279 ml free water daily  NUTRITION DIAGNOSIS:   Severe Malnutrition(Chronic) related to cancer and cancer related treatments, nausea as evidenced by energy intake < or equal to 75% for > or equal to 1 month, percent weight loss.  Ongoing  GOAL:   Patient will meet greater than or equal to 90% of their needs  Progressing  MONITOR:   Vent status, TF tolerance, Skin, Labs, I & O's  REASON FOR ASSESSMENT:   Consult Enteral/tube feeding initiation and management  ASSESSMENT:   53 y/o female PMHx Anxiety, depression, significant tobacco abuse (>55 pack yr), and active lung cancer (dx Nov 2019) for which she has been receiving chemoradiation. Presented to ED from OP oncology appt d/t hypoxia, tachycardia and hypotension. CXR showed multilobar PNA. Admitted for sepsis 2/2 HCAP.   Extubated to BiPAP 2/12. Now on HFNC. SLP unable to complete swallow evaluation earlier today because patient was just given meds that made her sleepy.  Remains NPO. Received MD Consult for TF initiation and management. No enteral access at this time. Discussed with RN and Palliative Care PA. Palliative Care team continues to meet with patient and her family to determine goals of care. Cortrak has been ordered. Labs and medications reviewed.    Diet Order:   Diet Order            Diet NPO time specified  Diet effective now              EDUCATION NEEDS:   Education needs have been addressed  Skin:  Skin Assessment:  Reviewed RN Assessment  Last BM:  2/13  Height:   Ht Readings from Last 1 Encounters:  06/03/18 5\' 5"  (1.651 m)    Weight:   Wt Readings from Last 1 Encounters:  06/03/18 55.3 kg    Ideal Body Weight:  56.82 kg  BMI:  Body mass index is 20.29 kg/m.  Estimated Nutritional Needs:   Kcal:  1700-1900  Protein:  75-90 gm  Fluid:  1.7-1.9 L    Molli Barrows, RD, LDN, CNSC Pager [phone] After Hours Pager [phone]

## 2018-06-03 NOTE — Progress Notes (Signed)
  Echocardiogram 2D Echocardiogram has been performed with [facility].  Sharon Berg 06/03/2018, 5:39 PM

## 2018-06-03 NOTE — Progress Notes (Signed)
Call from floor RN  - wbc rising to 20K. Tem p 99 and similar -> ordered PCT and lactate  - questions on TF - ordered cortrak and nutrtion consult. Ok to start TF at trickle. She has had 2-3 days of bowel rest. First line feed in pancreatitis is the gut. No indication for TNA   - for night: recommend BiPAP  - Flu exposure -> daughte rhad flu. Patient Flu negative PCR 4d ago. If febrile, then can retest   - Any further questons: [facility] MD to be first contact     SIGNATURE    Dr. Brand Males, M.D., F.C.C.P,  Pulmonary and Critical Care Medicine Staff Physician, [place] Director - Interstitial Lung Disease  Program  Pulmonary [place] at [place], [place], [address]  Pager: [phone], If no answer or between  15:00h - 7:00h: call [phone] Telephone: [phone]  2:17 PM 06/03/2018

## 2018-06-03 NOTE — Procedures (Signed)
Echo attempted. Patient with PT and/or OT. Will attempt again later.

## 2018-06-03 NOTE — Care Management (Addendum)
CM received LTACH consult - per attending pt/family wants to continue chemotherapy.  CM discussed this with both [facility] and [facility] - neither facility can accept active chemo patients.  Attending made aware  CM left VM for [facility] in [place] requesting callback ( next closest facility)

## 2018-06-03 NOTE — Progress Notes (Signed)
Daily Progress Note   Patient Name: Sharon Berg       Date: 06/03/2018 DOB: 11-14-1965  Age: 53 y.o. MRN#: [id number] Attending Physician: Elmarie Shiley, MD Primary Care Physician: [name], MD Admit Date: 05/25/2018  Reason for Consultation/Follow-up: Establishing goals of care, Pain control and Psychosocial/spiritual support  Subjective: Call from attending physician regarding patient's pain control and swallowing.  Patient has been receiving PRN fentanyl 100 mcg.  Per patient her pain is "all over" she is unable to describe the nature of her pain.  We considered using a PCA but the patient is too weak to be able to press the button.  Patient expresses anxiety.  She was on zoloft 100 mg, trazodone 300 mg and xanax at home prior to admission.  Will give trial of dilaudid and ativan.  1 hour after giving dilaudid and ativan we revisited the patient.  She was a lot more comfortable.     We brought up the subject of swallowing and aspiration.  Patient listened to information about feeding tubes without expressing her opinion.  She seemed to need time to process.  Patient's parents at bedside felt the swallow study was flawed (why not try a smoothie rather than an ice chip).  I assured them that we would continue to evaluate and try to improve her swallow.  Parents encouraged me to discuss a feeding tube with the patient's daughter Sharon Berg.  Patient herself is alert and orientated.  I confirmed with the patient that she would like for me to talk with Sharon Berg together with her at bedside.  Sharon Berg (dtr) will be at bedside tomorrow at 10:00 to discuss [place] and nutrition.   Assessment: Patient very thin and debilitated  - can't lift her hand, voice is a whisper.   On 25L HFNC.   NPO.   Patient Profile/HPI:  53 y.o. female  with past medical history of stage IIIb adenocarcinoma lung cancer diagnosed Dec 2019 (currently taking chemo/radiation), seizures, migraines, anxiety/depression, recent admission with post-obstructive pneumonia admitted on 06/07/2018 with SOB, cough, vomiting and being treated for multilobar pneumonia and respiratory failure requiring BiPAP. Also with poor tolerance of chemotherapy and extremely frail and deconditioned.    Length of Stay: 11  Current Medications: Scheduled Meds:  . Chlorhexidine Gluconate Cloth  6 each Topical Daily  .  feeding supplement (PRO-STAT SUGAR FREE 64)  30 mL Per Tube BID  . furosemide  40 mg Intravenous Q12H  .  HYDROmorphone (DILAUDID) injection  0.5 mg Intravenous Q4H  . insulin aspart  0-15 Units Subcutaneous Q4H  . ipratropium  0.5 mg Nebulization BID  . levalbuterol  0.63 mg Nebulization BID  . mouth rinse  15 mL Mouth Rinse BID  . methylPREDNISolone (SOLU-MEDROL) injection  125 mg Intravenous Q6H  . pantoprazole (PROTONIX) IV  40 mg Intravenous Daily  . sodium chloride flush  10-40 mL Intracatheter Q12H    Continuous Infusions: . sodium chloride Stopped (06/02/18 0717)  . dextrose 5 % and 0.45% NaCl 50 mL/hr at 06/03/18 0039    PRN Meds: sodium chloride, bisacodyl, HYDROmorphone (DILAUDID) injection, levalbuterol, LORazepam, ondansetron (ZOFRAN) IV, sodium chloride flush  Physical Exam        Frail debilitated female sitting in chair.  Responsive, coherent. CV tachycardic Resp no distress Abdomen thin, soft, nt   Vital Signs: BP (!) 140/92   Pulse (!) 119   Temp 98.5 F (36.9 C) (Oral)   Resp (!) 23   Ht 5\' 5"  (1.651 m)   Wt 51.8 kg   SpO2 97%   BMI 19.00 kg/m  SpO2: SpO2: 97 % O2 Device: O2 Device: High Flow Nasal Cannula O2 Flow Rate: O2 Flow Rate (L/min): 30 L/min  Intake/output summary:   Intake/Output Summary (Last 24 hours) at 06/03/2018 1211 Last data filed at 06/03/2018  1100 Gross per 24 hour  Intake 1162.16 ml  Output 2160 ml  Net -997.84 ml   LBM: Last BM Date: 06/02/18 Baseline Weight: Weight: 53.5 kg Most recent weight: Weight: 51.8 kg       Palliative Assessment/Data: 20%    Flowsheet Rows     Most Recent Value  Intake Tab  Referral Department  Hospitalist  Unit at Time of Referral  ICU  Palliative Care Primary Diagnosis  Cancer  Date Notified  05/24/18  Palliative Care Type  Return patient Palliative Care  Reason for referral  Clarify Goals of Care  Date of Admission  06/05/2018  Date first seen by Palliative Care  05/24/18  # of days Palliative referral response time  0 Day(s)  # of days IP prior to Palliative referral  1  Clinical Assessment  Psychosocial & Spiritual Assessment  Palliative Care Outcomes      Patient Active Problem List   Diagnosis Date Noted  . Pneumonia 05/26/2018  . Acute respiratory failure ([place])   . Protein-calorie malnutrition, severe 05/25/2018  . Sepsis with acute hypoxic respiratory failure without septic shock ([place]) 05/24/2018  . Severe malnutrition ([place]) 05/24/2018  . HCAP (healthcare-associated pneumonia) 06/16/2018  . Acute lower UTI   . Sepsis due to undetermined organism ([place]) 04/23/2018  . Non-small cell carcinoma of lung, right ([place]) 04/23/2018  . Intractable nausea and vomiting 04/23/2018  . Vomiting and diarrhea   . Primary lung cancer, right ([place])   . Cancer of right lung ([place]) 02/28/2018  . Seasonal affective disorder ([place]) 04/01/2017  . Seizure-like activity ([place]) 08/10/2016  . Impaired fasting glucose 11/11/2015  . Tension headache 12/18/2013  . 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 Anxiety and depression 07/30/2013    Palliative Care Plan    Recommendations/Plan:   Agree with shift to dilaudid for pain management.  Will schedule with holding parameters and continue PRN as well.  Will add ativan PRN q 6 hours.   Patient was on significant anxiety meds prior  to  admission   Discussed nutrition.  Will return tomorrow at 10:00 to address nutrition with patient and her daughter Sharon Berg   Goals of Care and Additional Recommendations:  Limitations on Scope of Treatment: Full Scope Treatment  Code Status:  Full code  Prognosis:   Unable to determine   Discharge Planning:  To Be Determined  Care plan was discussed with attending physician and family.  Thank you for allowing the Palliative Medicine Team to assist in the care of this patient.  Total time spent:  35 min.     Greater than 50%  of this time was spent counseling and coordinating care related to the above assessment and plan.  Florentina Jenny, PA-C Palliative Medicine  Please contact Palliative MedicineTeam phone at [phone] for questions and concerns between 7 am - 7 pm.   Please see AMION for individual provider pager numbers.

## 2018-06-03 NOTE — Progress Notes (Signed)
Sharon Berg, [phone], HCPOA, and daughter said she spoke with palliative care and set up a meeting with them for tomorrow.    Sharon Berg said she was diagnosed by her doctor with the flu. Sharon Berg said came to ICU yesterday.  I told her I would give her phone number to the doctor. And that while she has the flu it may be best if she does not come to hospital.  At the end of the conversation, Dr. Candiss Norse said if she is going back on bipap, she may need to be transferred to ICU

## 2018-06-03 NOTE — Progress Notes (Signed)
SLP Cancellation Note  Patient Details Name: Sharon Berg MRN: [id number] DOB: 06/21/1965   Cancelled treatment:       Reason Eval/Treat Not Completed: Other (comment) RN says pt just received dilaudid and ativan. She suggests SLP return at another time. Will continue to follow as able to determine readiness for POs. Given overall deconditioning, respiratory status, and recommendations from BSE on previous date, would maintain NPO status for now.    [name] Maire Govan 06/03/2018, 10:06 AM  [name], M.A. [place] Acute Environmental education officer [phone] Office [phone]

## 2018-06-03 NOTE — Progress Notes (Signed)
  Speech Language Pathology Treatment: Dysphagia  Patient Details Name: Sharon Berg MRN: [id number] DOB: June 13, 1965 Today's Date: 06/03/2018 Time: 0932-3557 SLP Time Calculation (min) (ACUTE ONLY): 30 min  Assessment / Plan / Recommendation Clinical Impression  Pt remains very deconditioned. She can produce small amounts of phonation, an improvement per family compared to previous date, but she is still significantly dysphonic. Single ice chips were provided (and rationale for use of ice chips explained) with consistent, immediate coughing that followed. [place] was offered but not successful at retrieving and secretions. SLP provided education to pt and family (husband, parents) about current level of function in regards to oropharyngeal swallowing. We reviewed aspiration risk, potential complications of aspiration, and the importance of oral hygiene. They are primarily concerned with nutrition at this point. We discussed that offering PO diet at this point could provide more risk than benefit if pt is aspirating. Temporary alternative means of nutrition could be offered if in line with overall GOC. It does seem like at this point they are wanting to see how much progress she can make. I reinforced that placement of the cortrak does not mean swallow therapy would stop, but we would still have to see how she progresses. All questions were answered and they are in agreement with current recommendations: NPO due to risk of aspiration with additional SLP f/u pending Sharon Berg discussion (palliative care meeting scheduled for the morning).    HPI HPI: 53 year old female, immunocompromised on chemo R lung adenocarcinoma IIIb, with sepsis due to HCAP transferred to [facility] from [facility] on 05/26/18 after she was intubated for acute respiratory failure      SLP Plan  Continue with current plan of care       Recommendations  Diet recommendations: NPO Medication Administration: Via alternative means                 Oral Care Recommendations: Oral care QID Follow up Recommendations: (tba) SLP Visit Diagnosis: Dysphagia, unspecified (R13.10) Plan: Continue with current plan of care       GO                Sharon Berg 06/03/2018, 4:14 PM  Sharon Berg, M.A. [place] Acute Environmental education officer [phone] Office [phone]

## 2018-06-03 NOTE — Consult Note (Addendum)
NAME:  Sharon Berg, MRN:  [id number], DOB:  July 04, 1965, LOS: 40 ADMISSION DATE:  05/26/2018, CONSULTATION DATE:  05/26/18 REFERRING MD:  Faylene Kurtz, CHIEF COMPLAINT:  Respiratory failure    Brief History   53 year old female, immunocompromised on chemo R lung adenocarcinoma IIIb, with sepsis due to HCAP transferred to [facility] [place] on 05/26/18 after she was intubated for acute respiratory failure.   History of present illness   53 year old female with PMH adenocarcinoma of right lung, seizure disorder, depression, anxiety, migraine who presented to [facility] ED on 06/06/2018 for shortness of breath with associated cough and vomiting. Shortness of breath began 05/22/2018 and cough began 05/20/2018. Cough is productive with sputum that is purulent and dark brown in color. The patient endorsed vomiting which began 05/21/2018, and had poor oral intake x 3 days prior to presentation on 2/3. She endorsed recent illness, with recent hospitalization 1/4-1/6 2020 for PNA (aspiration and postobstructive) causing sepsis. She denied loose stools and abdominal pain. She endorsed chronic immunosuppression, receiving chemotherapy for lung cancer.   In ED at [facility], the patient was hypotensive, tachycardic and tachypneic. A CXR revealed R lung opacities, concerning for PNA and the patient was started on vanc/cefepime, later changed to pip/tazo and diflucan.   The continued treatment for HCAP. She underwent a CTA on 2/5 at which time PE was ruled out and it was noted that the underlying PNA was worse. Her respiratory status continued to decline, requiring BiPAP. With ongoing high BiPAP settings, the decision was made to intubate the patient 2/6 and transfer to [facility].  PCCM consulted for admission.   Past Medical History  Right Lung adenocarcinoma IIIb Migraine Seizure disorder Thrombocytopenia Depression Anxiety  Significant Hospital Events   2/3> admitted to [facility] 2/6> intubated,  transferred to [facility]  2/12>extubated  2/13>primary care transferred to triad  Consults:  PCCM  Procedures:  2/6: ETT, bronch with BAL 2/12: extubation   Significant Diagnostic Tests:  2/3 CXR> RUL opacities, RLL opacity.   2/5 CTA chest> no PE visualized on CTA. Increased cavitary lesion of right lung and right upper lobe, fistulization of R mainstem bronchus. Diffuse opacities throughout R lung. Small bilateral pleural effusions.  2/10 UE Venous dopplers: No DVT bilaterally  2/10 Abd [place]: mild dilation of common bile duct, fatty infiltration of liver 2/10 Abd Xray: neg  2/11 CT ab: c/w mild pancreatitis and likely gall stone passage   Micro Data:  BCx 2/3> no growth at 5 days MRSA 2/4> neg 2/7 BAL for Culture > neg , AFBsmear > neg , AFBCulture > pending, Fungus culture > neg 2/10: Legionella neg  Antimicrobials:  Vancomycin 2/3-2/5 Cefepime 2/3  Pip-tazo 2/4>> Diflucan 2/4>2/11  Interim history/subjective:  Up in chair. States she is overall doing better but continues to complain of all over body pain. States her breathing is still not improved   Objective   Blood pressure 131/78, pulse (!) 110, temperature (!) 97.5 F (36.4 C), temperature source Oral, resp. rate 17, height _0  (1.651 m), weight 51.8 kg, SpO2 99 %.    FiO2 (%):  [60 %-70 %] 70 %   Intake/Output Summary (Last 24 hours) at 06/03/2018 0752 Last data filed at 06/03/2018 0700 Gross per 24 hour  Intake 1614.7 ml  Output 2550 ml  Net -935.3 ml   Filed Weights   06/01/18 0500 06/02/18 0500 06/03/18 0445  Weight: 61.6 kg 56.5 kg 51.8 kg    Examination:  General: awake and alert, NAD, sitting up in chair  HENT: PERRL, EOMI Lungs: CTAB, no wheezes, rales, or rhonchi  Cardiovascular: RRR, no MRG  Abdomen: soft, diffuse tenderness, non distended, bowel sounds present  Extremities: peripheral edema  Neuro: awake and alert, following commands  GU: foley in place   Resolved Hospital Problem list     Hypernatremia  Hyponatremia   Assessment & Plan:  Acute hypoxemic respiratory failure:  Improved. No longer vented, tolerating HFNC. Multifactorial. CT concerning for HCAP in immunocompromised patient. 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  RVP negative Strep pneumo negative Extubated 2/12, currently on HFNC  P: Continue solumedrol 52m q12h, can consider increasing given elevated sed rate  Ipratropium neg bid  Follow cultures and cytology on BAL. Lasix 45mbid   Hypokalemia K of 4.2 P Replete PRN  Transaminitis Stable  P Trend LFTs  Sepsis PNA -BCx from 2/3 NGTD P Continue Zosyn (2/4>>)  Adenocarcinoma IIIb  - s/p 6x chemo -home carbolatin, paclitaxel  P Holding therapy  Hypomagnesemia Mg 2.0 P Replete PRN  Thrombocytopenia plt 140 P -Follow CBC, transfuse plt if <10 without bleeding or <50 with active bleeding -no chemical VTE ppx at this time (SCDs only for now)  Malnutrition  Would consider restarting feeding if passes swallow eval   Hyperglycemia, at risk In setting of Critical illness, systemic steroids AM CBG 154. Received 2U Aspart in 24h - SSI  Right arm, hand swelling - US[place]ative for DVT  - lasix bid   Abdominal pain Abd xray neg. Repeat showing no obstruction of ileus Abd [place] mild dilation of common bile duct, fatty infiltration of liver Abd CT showing mild pancreatitis and likely gall stone passage  - continue to monitor - Zosyn as above - will defer pain management to palliative as likely cancer related pain   Best practice:  Diet: NPO pending swallow eval Anxiety/Delirium protocol (if indicated): xanax PRN, fentanyl PRN   VAP protocol (if indicated): N/A DVT prophylaxis: SCDs GI prophylaxis: Protonix  Glucose control: SSI  Mobility: BR  Code Status: Full  Family Communication: no family at bedside  Disposition: [facility] primary, CCM  consulted   Labs   CBC: Recent Labs  Lab 05/29/18 0455  05/30/18 0435 05/31/18 0445 06/01/18 0456 06/02/18 0422 06/03/18 0240  WBC 10.8*  --  13.6* 13.9* 13.7* 14.7* 20.7*  NEUTROABS 10.1*  --   --   --   --   --   --   HGB 9.8*   < > 9.6* 9.4* 9.0* 9.3* 10.7*  HCT 30.0*   < > 30.7* 30.4* 28.4* 29.7* 33.6*  MCV 92.6  --  93.0 93.3 93.4 92.0 91.3  PLT 21*  --  27* 36* 48* 75* 140*   < > = values in this interval not displayed.    Basic Metabolic Panel: Recent Labs  Lab 05/28/18 1715 05/29/18 0455  05/30/18 0435 05/31/18 0445 06/01/18 0456 06/02/18 0422 06/02/18 1620 06/03/18 0240  NA  --  153*   < > 143 138 133* 137  --  137  K  --  3.5   < > 3.6 2.8* 3.4* 2.9* 3.2* 4.2  CL  --  107  --  94* 88* 85* 90*  --  95*  CO2  --  36*  --  38* 40* 41* 36*  --  30  GLUCOSE  --  213*  --  166* 166* 161* 161*  --  161*  BUN  --  37*  --  24* _0 --  14  CREATININE  --  0.35*  --  0.34* <0.30* 0.35* <0.30*  --  0.40*  CALCIUM  --  7.8*  --  7.6* 7.9* 7.7* 8.1*  --  8.2*  MG 1.9 1.9  --  1.7 1.8 1.8 1.8  --  2.0  PHOS 2.7 3.4  --  2.7 2.8 2.7  --   --   --    < > = values in this interval not displayed.   GFR: Estimated Creatinine Clearance: 65.7 mL/min (A) (by C-G formula based on SCr of 0.4 mg/dL (L)). Recent Labs  Lab 05/30/18 1025 05/30/18 1109 05/31/18 0445 06/01/18 0456 06/02/18 0422 06/03/18 0240  PROCALCITON 0.56  --  0.58 0.75  --   --   WBC  --   --  13.9* 13.7* 14.7* 20.7*  LATICACIDVEN  --  1.8  --   --   --   --     Liver Function Tests: Recent Labs  Lab 05/29/18 0455 05/31/18 0956 06/01/18 0456 06/02/18 0422 06/03/18 0240  AST 123* 56* 48* 34 50*  ALT 123* 123* 102* 76* 84*  ALKPHOS 155* 145* 145* 131* 155*  BILITOT 1.3* 1.2 0.9 0.8 1.3*  PROT 3.4* 4.2* 4.1* 4.1* 4.7*  ALBUMIN 1.4* 1.5* 1.4* 1.4* 1.6*   Recent Labs  Lab 05/30/18 1025 05/31/18 0445 06/02/18 0422  LIPASE 62* 43 30  AMYLASE 192*  --   --    No results for input(s):  AMMONIA in the last 168 hours.  ABG    Component Value Date/Time   PHART 7.486 (H) 05/30/2018 0354   PCO2ART 60.9 (H) 05/30/2018 0354   PO2ART 68.0 (L) 05/30/2018 0354   HCO3 45.9 (H) 05/30/2018 0354   TCO2 48 (H) 05/30/2018 0354   ACIDBASEDEF 1.3 05/25/2018 1358   O2SAT 93.0 05/30/2018 0354     Coagulation Profile: No results for input(s): INR, PROTIME in the last 168 hours.  Cardiac Enzymes: No results for input(s): CKTOTAL, CKMB, CKMBINDEX, TROPONINI in the last 168 hours.  HbA1C: Hemoglobin A1C  Date/Time Value Ref Range Status  10/30/2015 03:20 PM 5.2  Final   Hgb A1c MFr Bld  Date/Time Value Ref Range Status  04/23/2018 09:02 AM 5.5 4.8 - 5.6 % Final    Comment:    (NOTE) Pre diabetes:          5.7%-6.4% Diabetes:              >6.4% Glycemic control for   <7.0% adults with diabetes     CBG: Recent Labs  Lab 06/02/18 1545 06/02/18 1943 06/02/18 2326 06/03/18 0417 06/03/18 0735  GLUCAP 119* 117* 118* 154* 124*    Review of Systems:   Patient reports all over pain. States her breathing is not well. But she does state she overall feels improved   Critical care time: 30 min

## 2018-06-03 NOTE — Progress Notes (Signed)
PROGRESS NOTE    Sharon Berg  JOI:[id number] DOB: March 30, 1966 DOA: 06/15/2018 PCP: [name], MD    Brief Narrative: 53 year old with past medical history significant for adenocarcinoma of the right lung seizure disorder, depression, anxiety who presented to [facility] on 2/3 for shortness of breath with associated cough and vomiting.  In the ED at [facility] the patient was hypotensive, tachycardic and tachypneic.  Chest x-ray revealed a right lung opacity concerning for pneumonia.  Patient continue with treatment for healthcare associated pneumonia.  She had a CTA on 2/5 which was negative for PE, noted that the underlying pneumonia was worse.  Her respiratory status continued to decline requiring BiPAP.  With ongoing BiPAP setting the decision was made to intubate on 2/6 and transferred to [facility]. Patient was treated with nebulizer, IV Solu-Medrol, IV antibiotics.  Was able to be extubated on February 12.  She still requires any high flow oxygen.  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[place])   Cancer of right lung ([place])   Primary lung cancer, right (HCC)   Non-small cell carcinoma of lung, right (HCC)   Intractable nausea and vomiting   HCAP (healthcare-associated pneumonia)   Sepsis with acute hypoxic respiratory failure without septic shock (HCC)   Severe malnutrition (HCC)   Protein-calorie malnutrition, severe   Pneumonia   Acute respiratory failure (HCC)  Acute respiratory failure with severe hypoxemia: Differential lymphangitis versus radiation pneumonitis versus low evidence for infection.;  Has large cavitation in the right lung that appears to 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 Patient received 9 days of IV Zosyn.  Monitor for fever.  If patient is spike fever will need to reconsider antibiotics. ESR elevated: Dr. Chase Caller recommending high-dose Solu-Medrol for 48 to 72 hours then prednisone over 3 to 9  weeks taper. With IV Lasix order by CCM. Chest x-ray to be repeated tomorrow morning. Echo order. Palliative care to continue to follow with patient and family for goals of care. If family wants to continue with aggressive care patient will require likely LTAC Follow culture and cytology of BAL  Sepsis; she was treated for pneumonia finished Zosyn 9 days.  Leukocytosis: Follow trend.  Spiked fever will need to be back on IV antibiotics.  Patient is on IV steroids that could explain leukocytosis.  Adenocarcinoma of the lung; stage 3B: Is status post chemo  Risk for hyperglycemia: Sliding scale insulin.  Right arm swelling Dopplers negative for DVT.  Abdominal pain Ultrasound showed mild dilation of the common bile duct fatty infiltration of the liver. CT showed mild pancreatitis and likely gallstone passage. Might need tube feeding.  Pain management: Anxiety Change fentanyl to Dilaudid. Added Ativan as needed. Reactive to follow.  Severe malnutrition;  Palliative care meeting with daughter tomorrow. To discussed tube feeding.   Overall goals of care; Palliative care will follow with patient tomorrow.   Nutrition Problem: Severe Malnutrition(Chronic) Etiology: cancer and cancer related treatments, nausea    Signs/Symptoms: energy intake < or equal to 75% for > or equal to 1 month, percent weight loss    Interventions: Tube feeding  Estimated body mass index is 19 kg/m as calculated from the following:   Height as of this encounter: _0  (1.651 m).   Weight as of this encounter: 51.8 kg.   DVT prophylaxis: start Lovenox, platelet improving.  Code Status: full code.  Family Communication: Daughter over the phone.  Disposition Plan: transfer to step down per CCM  Consultants:   CCM  Palliative.      Procedures:  2/3> admitted to [facility] 2/6>intubated, transferred to [facility]  2/12>extubated  2/13>primary care transferred to triad   Antimicrobials:    Vancomycin 2/3-2/5 Cefepime 2/3  Pip-tazo 2/4>>2/12 Diflucan 2/4>2/11   Subjective: Alert, complaining of generalized pain.  Per nursing fentanyl has not been helping.  Patient has not been  able to sleep.   Objective: Vitals:   06/03/18 0445 06/03/18 0500 06/03/18 0600 06/03/18 0700  BP:  (!) 134/96 (!) 139/91 131/78  Pulse:  (!) 106 (!) 108 (!) 110  Resp:  _0 Temp:    (!) 97.5 F (36.4 C)  TempSrc:    Oral  SpO2:  100% 98% 99%  Weight: 51.8 kg     Height:        Intake/Output Summary (Last 24 hours) at 06/03/2018 0749 Last data filed at 06/03/2018 0700 Gross per 24 hour  Intake 1614.7 ml  Output 2550 ml  Net -935.3 ml   Filed Weights   06/01/18 0500 06/02/18 0500 06/03/18 0445  Weight: 61.6 kg 56.5 kg 51.8 kg    Examination:  General exam: Alert, thin appearing Respiratory system: Bilateral crackles Cardiovascular system: S1 & S2 heard, RRR. No JVD, murmurs, rubs, gallops or clicks. No pedal edema. Gastrointestinal system: Abdomen is nondistended, soft and nontender. No organomegaly or masses felt. Normal bowel sounds heard. Central nervous system: Alert and oriented.  Extremities: Symmetric 5 x 5 power. Skin: No rashes, lesions or ulcers    Data Reviewed: I have personally reviewed following labs and imaging studies  CBC: Recent Labs  Lab 05/29/18 0455  05/30/18 0435 05/31/18 0445 06/01/18 0456 06/02/18 0422 06/03/18 0240  WBC 10.8*  --  13.6* 13.9* 13.7* 14.7* 20.7*  NEUTROABS 10.1*  --   --   --   --   --   --   HGB 9.8*   < > 9.6* 9.4* 9.0* 9.3* 10.7*  HCT 30.0*   < > 30.7* 30.4* 28.4* 29.7* 33.6*  MCV 92.6  --  93.0 93.3 93.4 92.0 91.3  PLT 21*  --  27* 36* 48* 75* 140*   < > = values in this interval not displayed.   Basic Metabolic Panel: Recent Labs  Lab 05/28/18 1715 05/29/18 0455  05/30/18 0435 05/31/18 0445 06/01/18 0456 06/02/18 0422 06/02/18 1620 06/03/18 0240  NA  --  153*   < > 143 138 133* 137  --  137  K  --   3.5   < > 3.6 2.8* 3.4* 2.9* 3.2* 4.2  CL  --  107  --  94* 88* 85* 90*  --  95*  CO2  --  36*  --  38* 40* 41* 36*  --  30  GLUCOSE  --  213*  --  166* 166* 161* 161*  --  161*  BUN  --  37*  --  24* _1 --  14  CREATININE  --  0.35*  --  0.34* <0.30* 0.35* <0.30*  --  0.40*  CALCIUM  --  7.8*  --  7.6* 7.9* 7.7* 8.1*  --  8.2*  MG 1.9 1.9  --  1.7 1.8 1.8 1.8  --  2.0  PHOS 2.7 3.4  --  2.7 2.8 2.7  --   --   --    < > = values in this interval not displayed.  GFR: Estimated Creatinine Clearance: 65.7 mL/min (A) (by C-G formula based on SCr of 0.4 mg/dL (L)). Liver Function Tests: Recent Labs  Lab 05/29/18 0455 05/31/18 0956 06/01/18 0456 06/02/18 0422 06/03/18 0240  AST 123* 56* 48* 34 50*  ALT 123* 123* 102* 76* 84*  ALKPHOS 155* 145* 145* 131* 155*  BILITOT 1.3* 1.2 0.9 0.8 1.3*  PROT 3.4* 4.2* 4.1* 4.1* 4.7*  ALBUMIN 1.4* 1.5* 1.4* 1.4* 1.6*   Recent Labs  Lab 05/30/18 1025 05/31/18 0445 06/02/18 0422  LIPASE 62* 43 30  AMYLASE 192*  --   --    No results for input(s): AMMONIA in the last 168 hours. Coagulation Profile: No results for input(s): INR, PROTIME in the last 168 hours. Cardiac Enzymes: No results for input(s): CKTOTAL, CKMB, CKMBINDEX, TROPONINI in the last 168 hours. BNP (last 3 results) No results for input(s): PROBNP in the last 8760 hours. HbA1C: No results for input(s): HGBA1C in the last 72 hours. CBG: Recent Labs  Lab 06/02/18 1545 06/02/18 1943 06/02/18 2326 06/03/18 0417 06/03/18 0735  GLUCAP 119* 117* 118* 154* 124*   Lipid Profile: No results for input(s): CHOL, HDL, LDLCALC, TRIG, CHOLHDL, LDLDIRECT in the last 72 hours. Thyroid Function Tests: No results for input(s): TSH, T4TOTAL, FREET4, T3FREE, THYROIDAB in the last 72 hours. Anemia Panel: No results for input(s): VITAMINB12, FOLATE, FERRITIN, TIBC, IRON, RETICCTPCT in the last 72 hours. Sepsis Labs: Recent Labs  Lab 05/30/18 1025 05/30/18 1109 05/31/18 0445  06/01/18 0456  PROCALCITON 0.56  --  0.58 0.75  LATICACIDVEN  --  1.8  --   --     Recent Results (from the past 240 hour(s))  Culture, bal-quantitative     Status: None   Collection Time: 05/26/18  6:21 PM  Result Value Ref Range Status   Specimen Description BRONCHIAL ALVEOLAR LAVAGE  Final   Special Requests   Final    NONE Performed at [facility], [place] [address], [place], [place] [address]    Culture NO GROWTH  Final   Report Status 05/29/2018 FINAL  Final  Acid Fast Smear (AFB)     Status: None   Collection Time: 05/26/18  6:21 PM  Result Value Ref Range Status   AFB Specimen Processing Concentration  Final   Acid Fast Smear Negative  Final    Comment: (NOTE) Performed At: [facility] [place], [place] [id number] Rush Farmer MD [id number]    Source (AFB) BRONCHIAL ALVEOLAR LAVAGE  Final  Fungus Culture With Stain     Status: None (Preliminary result)   Collection Time: 05/26/18  6:21 PM  Result Value Ref Range Status   Fungus Stain Final report  Final    Comment: (NOTE) Performed At: [facility] [place], [place] [id number] Rush Farmer MD [id number]    Fungus (Mycology) Culture PENDING  Incomplete   Fungal Source BRONCHIAL ALVEOLAR LAVAGE  Final  Fungus Culture Result     Status: None   Collection Time: 05/26/18  6:21 PM  Result Value Ref Range Status   Result 1 Comment  Final    Comment: (NOTE) KOH/Calcofluor preparation:  no fungus observed. Performed At: [facility] [place], [place] [id number] Rush Farmer MD [id number]   Respiratory Panel by PCR     Status: None   Collection Time: 05/30/18 11:15 AM  Result Value Ref Range Status   Adenovirus NOT DETECTED NOT DETECTED Final   Coronavirus 229E NOT DETECTED NOT  DETECTED Final    Comment: (NOTE) The Coronavirus on the Respiratory Panel, DOES NOT test for the novel  Coronavirus (2019 nCoV)    Coronavirus HKU1  NOT DETECTED NOT DETECTED Final   Coronavirus NL63 NOT DETECTED NOT DETECTED Final   Coronavirus OC43 NOT DETECTED NOT DETECTED Final   Metapneumovirus NOT DETECTED NOT DETECTED Final   Rhinovirus / Enterovirus NOT DETECTED NOT DETECTED Final   Influenza A NOT DETECTED NOT DETECTED Final   Influenza B NOT DETECTED NOT DETECTED Final   Parainfluenza Virus 1 NOT DETECTED NOT DETECTED Final   Parainfluenza Virus 2 NOT DETECTED NOT DETECTED Final   Parainfluenza Virus 3 NOT DETECTED NOT DETECTED Final   Parainfluenza Virus 4 NOT DETECTED NOT DETECTED Final   Respiratory Syncytial Virus NOT DETECTED NOT DETECTED Final   Bordetella pertussis NOT DETECTED NOT DETECTED Final   Chlamydophila pneumoniae NOT DETECTED NOT DETECTED Final   Mycoplasma pneumoniae NOT DETECTED NOT DETECTED Final    Comment: Performed at [facility], [place] [address] [place], [place] [address]         Radiology Studies: Dg Chest Port 1 View  Result Date: 06/02/2018 CLINICAL DATA:  Respiratory failure EXAM: PORTABLE CHEST 1 VIEW COMPARISON:  Yesterday FINDINGS: Esophageal extubation. Right upper extremity PICC and left-sided porta catheter. Generalized interstitial coarsening with focal right upper lobe opacity adjacent to a cavity seen by CT. Hyperinflation. Normal heart size and mediastinal contours. IMPRESSION: Unchanged interstitial and airspace disease with right upper lobe cavity. Electronically Signed   By: Monte Fantasia M.D.   On: 06/02/2018 08:28   Dg Abd Portable 1v  Result Date: 06/02/2018 CLINICAL DATA:  Abdominal pain. EXAM: PORTABLE ABDOMEN - 1 VIEW COMPARISON:  Radiograph of May 30, 2018. FINDINGS: The bowel gas pattern is normal. No radio-opaque calculi or other significant radiographic abnormality are seen. IMPRESSION: No evidence of bowel obstruction or ileus. Electronically Signed   By: Marijo Conception, M.D.   On: 06/02/2018 19:24        Scheduled Meds: . ALPRAZolam  1 mg Per Tube  BID  . Chlorhexidine Gluconate Cloth  6 each Topical Daily  . feeding supplement (PRO-STAT SUGAR FREE 64)  30 mL Per Tube BID  . furosemide  40 mg Intravenous Q12H  . insulin aspart  0-15 Units Subcutaneous Q4H  . ipratropium  0.5 mg Nebulization BID  . levalbuterol  0.63 mg Nebulization BID  . mouth rinse  15 mL Mouth Rinse BID  . methylPREDNISolone (SOLU-MEDROL) injection  40 mg Intravenous Q12H  . pantoprazole (PROTONIX) IV  40 mg Intravenous Daily  . sodium chloride flush  10-40 mL Intracatheter Q12H   Continuous Infusions: . sodium chloride Stopped (06/02/18 0717)  . dextrose 5 % and 0.45% NaCl 50 mL/hr at 06/03/18 0039     LOS: 11 days    Time spent: 35 minutes.     Elmarie Shiley, MD [facility]  06/03/2018, 7:49 AM

## 2018-06-04 ENCOUNTER — Other Ambulatory Visit (HOSPITAL_COMMUNITY): Payer: 59

## 2018-06-04 ENCOUNTER — Inpatient Hospital Stay (HOSPITAL_COMMUNITY): Payer: 59

## 2018-06-04 DIAGNOSIS — Z515 Encounter for palliative care: Secondary | ICD-10-CM

## 2018-06-04 DIAGNOSIS — C3411 Malignant neoplasm of upper lobe, right bronchus or lung: Secondary | ICD-10-CM

## 2018-06-04 DIAGNOSIS — J9601 Acute respiratory failure with hypoxia: Secondary | ICD-10-CM

## 2018-06-04 LAB — GLUCOSE, CAPILLARY
Glucose-Capillary: 146 mg/dL — ABNORMAL HIGH (ref 70–99)
Glucose-Capillary: 168 mg/dL — ABNORMAL HIGH (ref 70–99)
Glucose-Capillary: 165 mg/dL — ABNORMAL HIGH (ref 70–99)
Glucose-Capillary: 134 mg/dL — ABNORMAL HIGH (ref 70–99)

## 2018-06-04 LAB — CBC
HCT: 32.6 % — ABNORMAL LOW (ref 36.0–46.0)
HEMOGLOBIN: 10.4 g/dL — AB (ref 12.0–15.0)
MCH: 28.8 pg (ref 26.0–34.0)
MCHC: 31.9 g/dL (ref 30.0–36.0)
MCV: 90.3 fL (ref 80.0–100.0)
Platelets: 160 10*3/uL (ref 150–400)
RBC: 3.61 MIL/uL — ABNORMAL LOW (ref 3.87–5.11)
RDW: 16.3 % — ABNORMAL HIGH (ref 11.5–15.5)
WBC: 22.7 10*3/uL — ABNORMAL HIGH (ref 4.0–10.5)
nRBC: 0 % (ref 0.0–0.2)

## 2018-06-04 LAB — COMPREHENSIVE METABOLIC PANEL
ALT: 76 U/L — ABNORMAL HIGH (ref 0–44)
AST: 44 U/L — AB (ref 15–41)
Albumin: 1.7 g/dL — ABNORMAL LOW (ref 3.5–5.0)
Alkaline Phosphatase: 142 U/L — ABNORMAL HIGH (ref 38–126)
Anion gap: 9 (ref 5–15)
BUN: 12 mg/dL (ref 6–20)
CO2: 33 mmol/L — ABNORMAL HIGH (ref 22–32)
CREATININE: 0.32 mg/dL — AB (ref 0.44–1.00)
Calcium: 7.9 mg/dL — ABNORMAL LOW (ref 8.9–10.3)
Chloride: 94 mmol/L — ABNORMAL LOW (ref 98–111)
GFR calc Af Amer: >60 mL/min (ref >60)
GFR calc non Af Amer: >60 mL/min (ref >60)
Glucose, Bld: 149 mg/dL — ABNORMAL HIGH (ref 70–99)
Potassium: 3.4 mmol/L — ABNORMAL LOW (ref 3.5–5.1)
Sodium: 136 mmol/L (ref 135–145)
Total Bilirubin: 1 mg/dL (ref 0.3–1.2)
Total Protein: 4.8 g/dL — ABNORMAL LOW (ref 6.5–8.1)

## 2018-06-04 LAB — MAGNESIUM: Magnesium: 1.8 mg/dL (ref 1.7–2.4)

## 2018-06-04 LAB — PROCALCITONIN: Procalcitonin: 0.79 ng/mL

## 2018-06-04 LAB — PHOSPHORUS: Phosphorus: 3.8 mg/dL (ref 2.5–4.6)

## 2018-06-04 MED ORDER — LORAZEPAM 2 MG/ML PO CONC: 1.0000 mg | ORAL | Status: DC | PRN

## 2018-06-04 MED ORDER — SODIUM CHLORIDE 0.9 % IV SOLN
1.0000 mg/h | INTRAVENOUS | Status: DC
  Administered 2018-06-04: 1 mg/h via INTRAVENOUS
  Filled 2018-06-04: qty 5

## 2018-06-04 MED ORDER — MIDAZOLAM HCL 2 MG/2ML IJ SOLN
INTRAMUSCULAR | Status: AC
  Filled 2018-06-04: qty 2

## 2018-06-04 MED ORDER — DEXTROSE-NACL 5-0.45 % IV SOLN
INTRAVENOUS | Status: DC
  Administered 2018-06-04: 14:00:00 via INTRAVENOUS

## 2018-06-04 MED ORDER — POTASSIUM CHLORIDE 10 MEQ/100ML IV SOLN
10.0000 meq | INTRAVENOUS | Status: AC
  Administered 2018-06-04 (×3): 10 meq via INTRAVENOUS
  Filled 2018-06-04 (×3): qty 100

## 2018-06-04 MED ORDER — POTASSIUM CHLORIDE CRYS ER 20 MEQ PO TBCR: 40.0000 meq | EXTENDED_RELEASE_TABLET | Freq: Once | ORAL | Status: DC

## 2018-06-04 MED ORDER — SERTRALINE HCL 100 MG PO TABS: 100.0000 mg | ORAL_TABLET | Freq: Every day | ORAL | Status: DC

## 2018-06-04 MED ORDER — GLYCOPYRROLATE 1 MG PO TABS: 1.0000 mg | ORAL_TABLET | ORAL | Status: DC | PRN

## 2018-06-04 MED ORDER — MORPHINE SULFATE (PF) 2 MG/ML IV SOLN
2.0000 mg | INTRAVENOUS | Status: DC | PRN
  Administered 2018-06-04: 2 mg via INTRAVENOUS

## 2018-06-04 MED ORDER — ACETAMINOPHEN 325 MG PO TABS: 650.0000 mg | ORAL_TABLET | Freq: Four times a day (QID) | ORAL | Status: DC | PRN

## 2018-06-04 MED ORDER — ONDANSETRON 4 MG PO TBDP: 4.0000 mg | ORAL_TABLET | Freq: Four times a day (QID) | ORAL | Status: DC | PRN

## 2018-06-04 MED ORDER — METOPROLOL TARTRATE 5 MG/5ML IV SOLN
5.0000 mg | Freq: Once | INTRAVENOUS | Status: AC
  Administered 2018-06-04: 5 mg via INTRAVENOUS

## 2018-06-04 MED ORDER — SERTRALINE HCL 50 MG PO TABS: 50.0000 mg | ORAL_TABLET | Freq: Every day | ORAL | Status: DC

## 2018-06-04 MED ORDER — ALBUTEROL SULFATE (2.5 MG/3ML) 0.083% IN NEBU: 2.5000 mg | INHALATION_SOLUTION | RESPIRATORY_TRACT | Status: DC | PRN

## 2018-06-04 MED ORDER — LORAZEPAM 2 MG/ML IJ SOLN
0.2500 mg | Freq: Three times a day (TID) | INTRAMUSCULAR | Status: DC
  Administered 2018-06-04: 0.25 mg via INTRAVENOUS
  Filled 2018-06-04: qty 1

## 2018-06-04 MED ORDER — HEPARIN SOD (PORK) LOCK FLUSH 100 UNIT/ML IV SOLN
500.0000 [IU] | INTRAVENOUS | Status: DC | PRN
  Administered 2018-06-04: 500 [IU]

## 2018-06-04 MED ORDER — LORAZEPAM 1 MG PO TABS: 1.0000 mg | ORAL_TABLET | ORAL | Status: DC | PRN

## 2018-06-04 MED ORDER — POLYVINYL ALCOHOL 1.4 % OP SOLN
1.0000 [drp] | Freq: Four times a day (QID) | OPHTHALMIC | Status: DC | PRN
  Filled 2018-06-04: qty 15

## 2018-06-04 MED ORDER — GLYCOPYRROLATE 0.2 MG/ML IJ SOLN: 0.2000 mg | INTRAMUSCULAR | Status: DC | PRN

## 2018-06-04 MED ORDER — SODIUM CHLORIDE 0.9% FLUSH
3.0000 mL | Freq: Two times a day (BID) | INTRAVENOUS | Status: DC
  Administered 2018-06-04: 3 mL via INTRAVENOUS

## 2018-06-04 MED ORDER — LORAZEPAM 2 MG/ML IJ SOLN
1.0000 mg | INTRAMUSCULAR | Status: DC | PRN
  Administered 2018-06-04 (×2): 1 mg via INTRAVENOUS
  Filled 2018-06-04 (×2): qty 1

## 2018-06-04 MED ORDER — ONDANSETRON HCL 4 MG/2ML IJ SOLN: 4.0000 mg | Freq: Four times a day (QID) | INTRAMUSCULAR | Status: DC | PRN

## 2018-06-04 MED ORDER — HYDROMORPHONE HCL 1 MG/ML IJ SOLN: 0.2500 mg | INTRAMUSCULAR | Status: DC | PRN

## 2018-06-04 MED ORDER — HYDROMORPHONE HCL 1 MG/ML IJ SOLN
0.2500 mg | INTRAMUSCULAR | Status: DC
  Administered 2018-06-04: 0.25 mg via INTRAVENOUS
  Filled 2018-06-04: qty 0.5

## 2018-06-04 MED ORDER — HALOPERIDOL LACTATE 2 MG/ML PO CONC
0.5000 mg | ORAL | Status: DC | PRN
  Filled 2018-06-04: qty 0.3

## 2018-06-04 MED ORDER — ACETAMINOPHEN 650 MG RE SUPP: 650.0000 mg | Freq: Four times a day (QID) | RECTAL | Status: DC | PRN

## 2018-06-04 MED ORDER — HYDROMORPHONE BOLUS VIA INFUSION
0.5000 mg | INTRAVENOUS | Status: DC | PRN
  Administered 2018-06-04 (×4): 1 mg via INTRAVENOUS
  Filled 2018-06-04: qty 1

## 2018-06-04 MED ORDER — BIOTENE DRY MOUTH MT LIQD: 15.0000 mL | OROMUCOSAL | Status: DC | PRN

## 2018-06-04 MED ORDER — SODIUM CHLORIDE 0.9% FLUSH: 3.0000 mL | INTRAVENOUS | Status: DC | PRN

## 2018-06-04 MED ORDER — HALOPERIDOL 0.5 MG PO TABS
0.5000 mg | ORAL_TABLET | ORAL | Status: DC | PRN
  Filled 2018-06-04: qty 1

## 2018-06-04 MED ORDER — SERTRALINE HCL 20 MG/ML PO CONC
100.0000 mg | Freq: Every day | ORAL | Status: DC
  Filled 2018-06-04: qty 5

## 2018-06-04 MED ORDER — MIDAZOLAM HCL 2 MG/2ML IJ SOLN
1.0000 mg | INTRAMUSCULAR | Status: DC | PRN
  Administered 2018-06-04: 2 mg via INTRAVENOUS

## 2018-06-04 MED ORDER — HYDROMORPHONE BOLUS VIA INFUSION
0.5000 mg | INTRAVENOUS | Status: DC | PRN
  Filled 2018-06-04: qty 1

## 2018-06-04 MED ORDER — HALOPERIDOL LACTATE 5 MG/ML IJ SOLN: 0.5000 mg | INTRAMUSCULAR | Status: DC | PRN

## 2018-06-04 MED ORDER — METOPROLOL TARTRATE 5 MG/5ML IV SOLN
5.0000 mg | Freq: Once | INTRAVENOUS | Status: AC
  Administered 2018-06-04: 5 mg via INTRAVENOUS
  Filled 2018-06-04: qty 5

## 2018-06-04 MED ORDER — MORPHINE SULFATE (PF) 2 MG/ML IV SOLN
INTRAVENOUS | Status: AC
  Filled 2018-06-04: qty 1

## 2018-06-04 MED ORDER — DIPHENHYDRAMINE HCL 50 MG/ML IJ SOLN: 25.0000 mg | INTRAMUSCULAR | Status: DC | PRN

## 2018-06-04 MED ORDER — DEXTROSE 5 % IV SOLN: INTRAVENOUS | Status: DC

## 2018-06-04 MED ORDER — LORAZEPAM 2 MG/ML IJ SOLN
1.0000 mg | INTRAMUSCULAR | Status: DC | PRN
  Administered 2018-06-04: 1 mg via INTRAVENOUS
  Filled 2018-06-04: qty 1

## 2018-06-04 MED ORDER — SODIUM CHLORIDE 0.9 % IV SOLN: 250.0000 mL | INTRAVENOUS | Status: DC | PRN

## 2018-06-04 MED ORDER — METOPROLOL TARTRATE 5 MG/5ML IV SOLN
INTRAVENOUS | Status: AC
  Filled 2018-06-04: qty 5

## 2018-06-05 MED ORDER — NOREPINEPHRINE 4 MG/250ML-% IV SOLN
INTRAVENOUS | Status: AC
  Filled 2018-06-05: qty 250

## 2018-06-05 NOTE — Progress Notes (Signed)
Patient went asystole at 2332. Family at bedside. 2nd RN verify Riki Altes, RN. Post-Mortem check list complete. Patient placement notified.

## 2018-06-05 NOTE — Progress Notes (Signed)
Wasted 84ml of Dilaudid gtt in the sink. Witnessed by Lonn Georgia, RN

## 2018-06-06 MED FILL — Perflutren Lipid Microsphere IV Susp 1.1 MG/ML: INTRAVENOUS | Qty: 10 | Status: AC

## 2018-06-19 NOTE — Progress Notes (Signed)
Transferred pt to icu with the assistance of the SWAT team.

## 2018-06-19 NOTE — Progress Notes (Signed)
I responded to a page from the nurse to provide spiritual support for the patient and family. I visited the patient's room with several family members present. I shared words of comfort and encouragement, read scriptures, and led in prayer. I provided spiritual support through pastoral presence and remained available for additional support as needed or requested.    06-10-18 1625  Clinical Encounter Type  Visited With Patient and family together  Visit Type Spiritual support;Patient actively dying  Referral From Nurse  Consult/Referral To Chaplain  Spiritual Encounters  Spiritual Needs Prayer;Emotional  Stress Factors  Patient Stress Factors Exhausted  Family Stress Factors Exhausted    Chaplain Dr Redgie Grayer

## 2018-06-19 NOTE — Progress Notes (Signed)
Called to patient room to place on BIPAP.  Heart rate and respiratory rate were increased and O2 sat was in the 80's.  Pt placed on BIPAP.  Pt very anxious and not wanting mask on.  RN made aware.

## 2018-06-19 NOTE — Progress Notes (Signed)
I called Dr. Candiss Norse to room for increased tachycardia, cachypnes, accessory muscle use and to talk with daughter, Sharon Berg, about plan of care for pt.  Sharon Berg told me that she has spoken to [place] and that [place] does not want to be intubated.    Dr. Candiss Norse, Sharon Berg and I were together in Sharon Berg's room.  Sharon Berg is semi-responsive.  Sharon Berg clearly indicated that she does not want the mask (bipap) back on . Sharon Berg does want her try try the mask again.  Dr. Candiss Norse explained the risks associated with BIPAP and that Sharon Berg told him and a resp therapist this morning that Sharon Berg does not want lifesaving measures taken, including extubation.   Sharon Berg agreed that she knows her mom does not want intubation.    Dr. Candiss Norse said if bipap doesn't work or pt gets worse, then end of life care is all that we can do.  Sharon Berg understood what Dr. Candiss Norse was saying.   I was present during the entire encounter.  Pt opened eyes at times and appeared to follow conversation.

## 2018-06-19 NOTE — Progress Notes (Addendum)
Daily Progress Note   Patient Name: Sharon Berg       Date: 2018/06/27 DOB: 1966-03-16  Age: 53 y.o. MRN#: [id number] Attending Physician: Thurnell Lose, MD Primary Care Physician: [name], MD Admit Date: 06/14/2018  Reason for Consultation/Follow-up: Establishing goals of care  Subjective: We met at the patient's bedside for the beginning of our conversation. Patient states her pain is much better controlled today and that she slept well last night. She does say that the Dilaudid makes her loopy, so we will decrease the dose. We discussed the findings of her echo in detail. We additionally discussed tube feeding and what that would entail. All questions from the family and patient were answered. We will proceed with CorTrak placement today. Family is hopeful for return in swallow function, but if function does not improve to sustain life, patient is okay with PEG placement.  We stepped outside the room with patient's daughter Magda Paganini) and patient's mother Judeth Porch) to discuss overall disease process. Magda Paganini states that at the most recent visit with the oncologist they told her, "the tumor is gone". She states how the remaining lung mass is necrotic and from radiation. We discussed the specifics of the staging of her tumor and how it is quite possible that she still has cancer as it has spread to her lymph nodes. We discussed how even if the cancer is gone, she still has significant underlying lung disease and deconditioning.  Additionally, we discussed that stage IIIb adenocarcinoma is NOT curative, and how treatment is aimed at palliative measures to increase length of life and alleviate symptoms. Magda Paganini and Judeth Porch acknowledged this.  Code Status was not discussed as the patient has  privately and personally made the decision for DNR with her attending physician. Patient is of sound mind and judgement.  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  She is a numbers person - very analytical.  Her husband works at [employer] as well.  They have been married 30 years.  Magda Paganini is her only child and they are very close.  She is a spiritual person and enjoys to eat. She has a love for teacup sized puppies, the beach, and eating good food (prior to chemo).  Assessment: 53 year old female with Stage IIIb adenocarcinoma of  the lung, COPD, depression, and anxiety.  She is severely debilitated. Admitted on 05/29/2018 for HCAP and acute respiratory failure.    Patient Profile/HPI: 53 y.o.femalewith past medical history of stage IIIb adenocarcinoma lung cancer diagnosed Dec 2019 (currently taking chemo/radiation), seizures, migraines, anxiety/depression, recent admission with post-obstructive pneumoniaadmitted on 02/01/2020with SOB, cough, vomiting and being treated for multilobar pneumonia and respiratory failure requiring BiPAP.Also with poor tolerance of chemotherapy and extremely frail and deconditioned.   Length of Stay: 12  Current Medications: Scheduled Meds:  . Chlorhexidine Gluconate Cloth  6 each Topical Daily  . enoxaparin (LOVENOX) injection  40 mg Subcutaneous Q24H  . furosemide  60 mg Intravenous TID  .  HYDROmorphone (DILAUDID) injection  0.5 mg Intravenous Q4H  . insulin aspart  0-15 Units Subcutaneous Q4H  . ipratropium  0.5 mg Nebulization BID  . levalbuterol  0.63 mg Nebulization BID  . LORazepam  0.25 mg Intravenous Q8H  . mouth rinse  15 mL Mouth Rinse BID  . methylPREDNISolone (SOLU-MEDROL) injection  125 mg Intravenous Q6H  . pantoprazole (PROTONIX) IV  40 mg Intravenous Daily  . sertraline  100 mg Per Tube Daily    Continuous Infusions: . sodium chloride Stopped (06/02/18 0717)  . dextrose 5 % and 0.45% NaCl 50 mL/hr at  06/03/18 1853  . feeding supplement (OSMOLITE 1.2 CAL) Stopped (06/03/18 1642)    PRN Meds: sodium chloride, bisacodyl, HYDROmorphone (DILAUDID) injection, levalbuterol, ondansetron (ZOFRAN) IV  Physical Exam      General: frail 53 year old female who looks older than her stated age, resting in bed Respiratory: on HFNC, normal effort Cardiac: tachycardic  Vital Signs: BP (!) 141/90   Pulse (!) 101   Temp 98.8 F (37.1 C) (Axillary)   Resp 15   Ht '5\' 5"'$  (1.651 m)   Wt 55.3 kg   SpO2 94%   BMI 20.29 kg/m  SpO2: SpO2: 94 % O2 Device: O2 Device: High Flow Nasal Cannula O2 Flow Rate: O2 Flow Rate (L/min): 30 L/min  Intake/output summary:   Intake/Output Summary (Last 24 hours) at 2018/06/30 1052 Last data filed at 06/30/2018 0600 Gross per 24 hour  Intake 1595.79 ml  Output 2275 ml  Net -679.21 ml   LBM: Last BM Date: 06/02/18 Baseline Weight: Weight: 53.5 kg Most recent weight: Weight: 55.3 kg       Palliative Assessment/Data: 30%    Flowsheet Rows     Most Recent Value  Intake Tab  Referral Department  Hospitalist  Unit at Time of Referral  ICU  Palliative Care Primary Diagnosis  Cancer  Date Notified  05/24/18  Palliative Care Type  Return patient Palliative Care  Reason for referral  Clarify Goals of Care  Date of Admission  05/30/2018  Date first seen by Palliative Care  05/24/18  # of days Palliative referral response time  0 Day(s)  # of days IP prior to Palliative referral  1  Clinical Assessment  Psychosocial & Spiritual Assessment  Palliative Care Outcomes      Patient Active Problem List   Diagnosis Date Noted  . Pressure injury of skin 06/03/2018  . Generalized pain   . Palliative care encounter   . Pneumonia 05/26/2018  . Acute respiratory failure ([place])   . Protein-calorie malnutrition, severe 05/25/2018  . Sepsis with acute hypoxic respiratory failure without septic shock ([place]) 05/24/2018  . Severe malnutrition ([place]) 05/24/2018  . HCAP  (healthcare-associated pneumonia) 06/14/2018  . Acute lower UTI   . Sepsis due to  undetermined organism ([place]) 04/23/2018  . Non-small cell carcinoma of lung, right ([place]) 04/23/2018  . Intractable nausea and vomiting 04/23/2018  . Vomiting and diarrhea   . Primary lung cancer, right ([place])   . Cancer of right lung ([place]) 02/28/2018  . Seasonal affective disorder ([place]) 04/01/2017  . Seizure-like activity ([place]) 08/10/2016  . Impaired fasting glucose 11/11/2015  . Tension headache 12/18/2013  . 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 Anxiety and depression 07/30/2013    Palliative Care Plan    Recommendations/Plan:  DNR status PER PATIENT WISHES.  For patient privacy we have requested the purple bracelet not be placed.  CorTrak will be placed today. Family hopeful for return of swallow function. If it does not, they are prepared to place a PEG  Decrease dilaudid dose to 0.'25mg'$  IV q4h scheduled and q1h PRN  Scheduled ativan 0.'25mg'$  IV q8h  Restart zoloft '50mg'$  per tube QD.  Will titrate back to 100 mg after 1 week.  PMT will continue to follow offering support to patient and family.  Family requests as much communication as possible about tests, nursing procedures, room changes etc... in order to reduce anxiety.   Goals of Care and Additional Recommendations:  Limitations on Scope of Treatment: Full Scope Treatment  Code Status:  DNR  Prognosis:   Unable to determine likely less than 6 months secondary to severe deconditioning, frailty, poor nutritional status and non-small cell lung cancer with recurrent pneumonia.  Discharge Planning:  To Be Determined.  Family would like to consider CIR eventually if possible.  Care plan was discussed with patient, attending, patient's mother and daughter  Thank you for allowing the Palliative Medicine Team to assist in the care of this patient.  Total time spent:  60 minutes Time in 9:50 Time out 10:50     Greater than 50%   of this time was spent counseling and coordinating care related to the above assessment and plan.  Ferrel Logan, PA-S 2018/07/02  Florentina Jenny, PA-C Palliative Medicine  Please contact Palliative MedicineTeam phone at [phone] for questions and concerns between 7 am - 7 pm.   Please see AMION for individual provider pager numbers.

## 2018-06-19 NOTE — Progress Notes (Addendum)
Called by attending physician as patient is actively dying and has been shifted to comfort care.  Patient dessated in to 74% on HFNC at 30L after HFNC was increased to 50L patient became tachycardic in the 130s, sats increased but then dropped to 85%, she was placed on BiPAP for a very short period of time.  She became very anxious with a pulse rate of 160.  Temp was 101.9.  Patient was shifted to comfort.  She is currently on HFNC but appears to be actively dying.  Suspect PE or aspiration.  Spoke with Dr. Chase Caller.   Spoke with dtr Magda Paganini in the hallway.  Magda Paganini is confused and distraught.  This happened very quickly.  Magda Paganini questioned if anxiety from Bipap caused her to have an MI.  Spoke with husband at bedside.  He is sad but accepting.  He repeats "this is not survivable".  After discussion with bedside RN and Dr. Chase Caller comfort orders were added to the chart.  Chaplain paged.  Florentina Jenny, PA-C Palliative Medicine Pager: [phone]  Time in 3:45 Time out 4:30  Total time on 2/15 105 min.

## 2018-06-19 NOTE — Progress Notes (Signed)
NAME:  Sharon Berg, MRN:  [id number], DOB:  Sep 03, 1965, LOS: 12 ADMISSION DATE:  05/24/2018, CONSULTATION DATE:  05/26/18 REFERRING MD:  Faylene Kurtz, CHIEF COMPLAINT:  Respiratory failure    Brief History   53 year old female, immunocompromised on chemo R lung adenocarcinoma IIIb, with sepsis due to HCAP transferred to [facility] from [place] on 05/26/18 after she was intubated for acute respiratory failure.   History of present illness   53 year old female with PMH adenocarcinoma of right lung, seizure disorder, depression, anxiety, migraine who presented to [facility] ED on 06/07/2018 for shortness of breath with associated cough and vomiting. Shortness of breath began 05/22/2018 and cough began 05/20/2018. Cough is productive with sputum that is purulent and dark brown in color. The patient endorsed vomiting which began 05/21/2018, and had poor oral intake x 3 days prior to presentation on 2/3. She endorsed recent illness, with recent hospitalization 1/4-1/6 2020 for PNA (aspiration and postobstructive) causing sepsis. She denied loose stools and abdominal pain. She endorsed chronic immunosuppression, receiving chemotherapy for lung cancer.   In ED at [facility], the patient was hypotensive, tachycardic and tachypneic. A CXR revealed R lung opacities, concerning for PNA and the patient was started on vanc/cefepime, later changed to pip/tazo and diflucan.   The continued treatment for HCAP. She underwent a CTA on 2/5 at which time PE was ruled out and it was noted that the underlying PNA was worse. Her respiratory status continued to decline, requiring BiPAP. With ongoing high BiPAP settings, the decision was made to intubate the patient 2/6 and transfer to [facility].  PCCM consulted for admission.   Past Medical History  Right Lung adenocarcinoma IIIb Migraine Seizure disorder Thrombocytopenia Depression Anxiety  Significant Hospital Events   2/3> admitted to [facility] 2/6> intubated,  transferred to [facility]  2/12>extubated  21/13- Patient very tired this morning. Nods that she is not doing well but does not state why.   Consults:  PCCM  Procedures:  2/6: ETT, bronch with BAL 2/12: extubation   Significant Diagnostic Tests:  2/3 CXR> RUL opacities, RLL opacity.   2/5 CTA chest> no PE visualized on CTA. Increased cavitary lesion of right lung and right upper lobe, fistulization of R mainstem bronchus. Diffuse opacities throughout R lung. Small bilateral pleural effusions.  2/10 UE Venous dopplers: No DVT bilaterally  2/10 Abd [place]: mild dilation of common bile duct, fatty infiltration of liver 2/10 Abd Xray: neg  2/11 CT ab: c/w mild pancreatitis and likely gall stone passage   Micro Data:  BCx 2/3> no growth at 5 days MRSA 2/4> neg 2/7 BAL for Culture > neg , AFBsmear > neg , AFBCulture > pending, Fungus culture > pending 2/10: Legionella neg  Antimicrobials:  Vancomycin 2/3-2/5 Cefepime 2/3  Pip-tazo 2/4>> Diflucan 2/4>2/11  Interim history/subjective:    2/15 - progrssive hypoxemic resp failure and moved to ICU and ccm recalled.In floor - conflict in goals between patient (wants DNR, comfort) v daughter/husband (who want full code). Moved to ICU for intubation. Upomn cCM arrival RR 30s and desaturations to 70s. Husband/daughter initially saying that patient was improving all along since ICU admission but now shocked she has declined. They feel is anxiety and loss of capacity and she never had capacity to make DNR (other care providers did not agree with it)   Higher dose steroids started yesterday but not responding to that or lasix    Objective   Blood pressure 134/84, pulse Marland Kitchen)  146, temperature (!) 101.9 F (38.8 C), temperature source Axillary, resp. rate (!) 33, height _0  (1.651 m), weight 55.3 kg, SpO2 (!) 85 %.    FiO2 (%):  [50 %-100 %] 100 %   Intake/Output Summary (Last 24 hours) at 06-21-2018 1553 Last data filed at 21-Jun-2018 0600 Gross per  24 hour  Intake 1545.79 ml  Output 2275 ml  Net -729.21 ml   Filed Weights   06/02/18 0500 06/03/18 0445 06/03/18 1320  Weight: 56.5 kg 51.8 kg 55.3 kg    Examination:  Frail Looks moribund/actvively dying RR 35, mild paradoxical Pulse ox 77%-85% on 8L HFNC Alert somewhat only Acc muscle use Pale Tachy    LABS    PULMONARY Recent Labs  Lab 05/30/18 0354  PHART 7.486*  PCO2ART 60.9*  PO2ART 68.0*  HCO3 45.9*  TCO2 48*  O2SAT 93.0    CBC Recent Labs  Lab 06/02/18 0422 06/03/18 0240 06/21/2018 0341  HGB 9.3* 10.7* 10.4*  HCT 29.7* 33.6* 32.6*  WBC 14.7* 20.7* 22.7*  PLT 75* 140* 160    COAGULATION No results for input(s): INR in the last 168 hours.  CARDIAC  No results for input(s): TROPONINI in the last 168 hours. No results for input(s): PROBNP in the last 168 hours.   CHEMISTRY Recent Labs  Lab 05/29/18 0455  05/30/18 0435 05/31/18 0445 06/01/18 0456 06/02/18 0422  06/03/18 0240 2018-06-21 0341  NA 153*   < > 143 138 133* 137  --  137 136  K 3.5   < > 3.6 2.8* 3.4* 2.9*   < > 4.2 3.4*  CL 107  --  94* 88* 85* 90*  --  95* 94*  CO2 36*  --  38* 40* 41* 36*  --  30 33*  GLUCOSE 213*  --  166* 166* 161* 161*  --  161* 149*  BUN 37*  --  24* _1 --  14 12  CREATININE 0.35*  --  0.34* <0.30* 0.35* <0.30*  --  0.40* 0.32*  CALCIUM 7.8*  --  7.6* 7.9* 7.7* 8.1*  --  8.2* 7.9*  MG 1.9  --  1.7 1.8 1.8 1.8  --  2.0 1.8  PHOS 3.4  --  2.7 2.8 2.7  --   --   --  3.8   < > = values in this interval not displayed.   Estimated Creatinine Clearance: 70.2 mL/min (A) (by C-G formula based on SCr of 0.32 mg/dL (L)).   LIVER Recent Labs  Lab 05/31/18 0956 06/01/18 0456 06/02/18 0422 06/03/18 0240 Jun 21, 2018 0341  AST 56* 48* 34 50* 44*  ALT 123* 102* 76* 84* 76*  ALKPHOS 145* 145* 131* 155* 142*  BILITOT 1.2 0.9 0.8 1.3* 1.0  PROT 4.2* 4.1* 4.1* 4.7* 4.8*  ALBUMIN 1.5* 1.4* 1.4* 1.6* 1.7*     INFECTIOUS Recent Labs  Lab 05/30/18 1109   06/01/18 0456 06/03/18 1455 June 21, 2018 0341  LATICACIDVEN 1.8  --   --  1.4  --   PROCALCITON  --    < > 0.75 0.62 0.79   < > = values in this interval not displayed.     ENDOCRINE CBG (last 3)  Recent Labs    06-21-18 0806 06/21/2018 1153 06-21-2018 1535  GLUCAP 168* 165* 134*         IMAGING x48h  - image(s) personally visualized  -   highlighted in bold Dg Chest Port 1 View  Result Date: June 21, 2018 CLINICAL  DATA:  Respiratory failure. History of lung cancer and pneumonia. EXAM: PORTABLE CHEST 1 VIEW COMPARISON:  Radiographs 06/02/2018 and 06/01/2018.  CT 05/25/2018. FINDINGS: 0649 hours. Right arm PICC and left subclavian Port-A-Cath are unchanged in position. The heart size and mediastinal contours are stable. Cavitary right upper lobe lesion and surrounding opacity are stable. There is diffuse pulmonary interstitial prominence which has partially cleared from the CT 10 days ago. No new airspace disease, significant pleural effusion or pneumothorax. IMPRESSION: Stable examination from recent prior studies. Cavitary right upper lobe lesion and surrounding pulmonary opacity are unchanged. Electronically Signed   By: Richardean Sale M.D.   On: 06-30-18 08:52   Dg Abd Portable 1v  Result Date: 06/02/2018 CLINICAL DATA:  Abdominal pain. EXAM: PORTABLE ABDOMEN - 1 VIEW COMPARISON:  Radiograph of May 30, 2018. FINDINGS: The bowel gas pattern is normal. No radio-opaque calculi or other significant radiographic abnormality are seen. IMPRESSION: No evidence of bowel obstruction or ileus. Electronically Signed   By: Marijo Conception, M.D.   On: 06/02/2018 19:24     Resolved Hospital Problem list   Hypernatremia   Assessment & Plan:  Acute hypoxemic respiratory failure:  CT concerning for HCAP v ALI from chemo/XRT v lymphangitis in immunocompromised patient. 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    -  06/30/18: she is in terminal decline with progressive respiratory failure from ALI  PLAN  - explained with RN Lilia Pro that patient is in terminal decline and not responding to Rx  - explained non survivable - explained prognopsis is hours to tdays  - after this they agreed for comfort measures with DNR - terminal care orders with opioid gtt orders written     ATTESTATION & SIGNATURE   The patient JAMELAH SITZER is critically ill with multiple organ systems failure and requires high complexity decision making for assessment and support, frequent evaluation and titration of therapies, application of advanced monitoring technologies and extensive interpretation of multiple databases.   Critical Care Time devoted to patient care services described in this note is  30  Minutes. This time reflects time of care of this signee Dr Brand Males. This critical care time does not reflect procedure time, or teaching time or supervisory time of PA/NP/Med student/Med Resident etc but could involve care discussion time     Dr. Brand Males, M.D., [facility].C.P Pulmonary and Critical Care Medicine Staff Physician [facility] Pager: [phone], If no answer or between  15:00h - 7:00h: call [phone]  06/30/18 3:59 PM

## 2018-06-19 NOTE — Progress Notes (Addendum)
PROGRESS NOTE    Sharon Berg  YHC:[id number] DOB: 1966/01/28 DOA: 05/25/2018 PCP: [name], MD    Brief Narrative: 53 year old with past medical history significant for adenocarcinoma of the right lung seizure disorder, depression, anxiety who presented to [facility] on 2/3 for shortness of breath with associated cough and vomiting.  In the ED at [facility] the patient was hypotensive, tachycardic and tachypneic.  Chest x-ray revealed a right lung opacity concerning for pneumonia.  Patient continue with treatment for healthcare associated pneumonia.  She had a CTA on 2/5 which was negative for PE, noted that the underlying pneumonia was worse.  Her respiratory status continued to decline requiring BiPAP.  With ongoing BiPAP setting the decision was made to intubate on 2/6 and transferred to [facility]. Patient was treated with nebulizer, IV Solu-Medrol, IV antibiotics.  Was able to be extubated on February 12.  She still requires 15 lit high flow oxygen.   Subjective - Patient in bed,  denies any headache, no fever, no chest pain or pressure, shortness of breath is improved on high flow oxygen, no abdominal pain. No focal weakness.   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  Pulmonary following, I discussed the case with critical care physician Dr. Chase Caller in detail on 06/08/18, extremely poor long-term prognosis.  Palliative care also on board but family seems to have some unreasonable expectations.  Detailed discussion with patient as well as bedside with RT Carmie End being witness.  She repeated what she had told palliative care team few days ago that  she wants to be DNR, she is agreeable for ongoing medical care and would like to get feeding tube if needed.  Note patient previously wanted to be DNR as well but family reversed her decisions, at this time she has the capacity to decide for herself and I will respect her wishes which she has expressed to me in front of respiratory therapist without being under any pressure with family or staff.   He understands that her prognosis is not good.  He has currently finished 9 days of IV Zosyn, has been placed on high-dose IV steroids for radiation pneumonitis by pulmonary critical care, continue high flow i again along with supportive care with nebulizer treatments.  Again long-term prognosis appears to be very poor.  Continue supportive care and monitor.   Addendum.  At 2 PM was called by the nurse as patient getting more lethargic and now getting tachycardic, still on 70% FiO2 high flow 15 L nasal cannula oxygen.  Will have to put her back on BiPAP, since she is extremely weak risk of aspiration is very high, also discussed with critical care physician Dr. Chase Caller she will be monitored in ICU.  And had clearly told me this morning that she does not want to be placed on ventilator and wants to be DNR, she also had voiced the same thing to her daughter this morning according to RN Cecille Rubin who is taking care of the patient today.  However the daughter tells me that as soon as patient becomes unconscious she intends to change her mom to full code with intubation if needed as she holds the  POA.  I have told her that this would not reflect patient's wishes but I am unsure if we can refute her decision as she holds POA although I do not think it is in the best interest of the patient.    Sepsis; she was treated for pneumonia finished Zosyn 9 days.  Sputum physiology seems to have resolved.  Leukocytosis: Currently afebrile, clinically infection seems to be better, leukocytosis likely due to high-dose IV  steroids.  R. Adenocarcinoma of the lung; stage 3B: Is status post chemo & radiation, prognosis guarded.  Right arm swelling Dopplers negative for DVT.  Abdominal pain - Ultrasound showed mild dilation of the common bile duct fatty infiltration of the liver, CT showed mild pancreatitis and likely gallstone passage.  Lipase has normalized and pain has resolved, monitor.  Currently not clinically stable for MRCP ERCP or any invasive procedures.  Will closely monitor. Will involve GI for input as well, DW DR Armbruister on 06/04/17 - RUQ [place] and monitor.  Pain management: Anxiety - continue pain control and anxiety control.  Severe malnutrition; place NG feeding tube with tube feeds as expressed by patient.   Overall goals of care; Palliative care following, discussed the plan with palliative care on 06/09/18.  DNR but continue present line of medical care including feeding tube.  Risk for hyperglycemia due to IV steroids high-dose: Sliding scale insulin.  CBG (last 3)  Recent Labs    09-Jun-2018 0508 06-09-2018 0806 06-09-2018 1153  GLUCAP 146* 168* 165*     DVT prophylaxis: start Lovenox, platelet improving.  Code Status: Full code  Family Communication: None present  Disposition Plan: Step down  Consultants:   CCM  Palliative.   GI   Procedures:  2/3> admitted to [facility] 2/6>intubated, transferred to [facility]  2/12>extubated  2/13>primary care transferred to [facility]   Antimicrobials:   Anti-infectives (From admission, onward)   Start     Dose/Rate Route Frequency Ordered Stop   05/31/18 1500  piperacillin-tazobactam (ZOSYN) IVPB 3.375 g  Status:  Discontinued     3.375 g 12.5 mL/hr over 240 Minutes Intravenous Every 8 hours 05/31/18 0955 06/01/18 0938   05/26/18 2000  piperacillin-tazobactam (ZOSYN) IVPB 3.375 g  Status:  Discontinued     3.375 g 12.5 mL/hr over 240 Minutes Intravenous Every 8 hours 05/26/18 1823 05/31/18 0935   05/24/18 1000  fluconazole (DIFLUCAN) tablet 100  mg  Status:  Discontinued     100 mg Oral Daily 05/30/2018 2017 05/30/18 1017   05/24/18 0600  vancomycin (VANCOCIN) 500 mg in sodium chloride 0.9 % 100 mL IVPB  Status:  Discontinued     500 mg 100 mL/hr over 60 Minutes Intravenous Every 12 hours 06/05/2018 1902 05/25/18 0953   05/24/18 0200  ceFEPIme (MAXIPIME) 2 g in sodium chloride 0.9 % 100 mL IVPB  Status:  Discontinued     2 g 200 mL/hr over 30 Minutes Intravenous Every 8 hours 05/26/2018 1901 05/30/2018 1955   05/24/18 0000  piperacillin-tazobactam (ZOSYN) IVPB 3.375 g  Status:  Discontinued     3.375 g 12.5 mL/hr over 240 Minutes Intravenous Every 8 hours 06/03/2018 2038 05/26/18 1823   06/14/2018 1745  vancomycin (VANCOCIN) IVPB 1000 mg/200 mL premix     1,000 mg 200 mL/hr over 60 Minutes Intravenous  Once 06/06/2018 1730 06/17/2018 1917   05/26/2018 1745  ceFEPIme (MAXIPIME) 2 g in sodium chloride 0.9 % 100 mL IVPB     2 g 200 mL/hr over 30 Minutes Intravenous  Once 05/22/2018 1730 06/10/2018 1813       Subjective: Alert, complaining of generalized pain.  Per nursing fentanyl has not been helping.  Patient has not been  able to sleep.   Objective: Vitals:   06-21-2018 0816 06-21-18 0952 06/21/18 1149 2018-06-21 1154  BP:      Pulse:    (!) 117  Resp:    20  Temp:   97.6 F (36.4 C)   TempSrc:   Axillary   SpO2: 98% 94% (!) 86% 91%  Weight:      Height:        Intake/Output Summary (Last 24 hours) at 06-21-18 1211 Last data filed at 2018-06-21 0600 Gross per 24 hour  Intake 1545.79 ml  Output 2275 ml  Net -729.21 ml   Filed Weights   06/02/18 0500 06/03/18 0445 06/03/18 1320  Weight: 56.5 kg 51.8 kg 55.3 kg    Examination:  Awake Alert,  No new F.N deficits, Normal affect [place].AT,PERRAL Supple Neck,No JVD, No cervical lymphadenopathy appriciated.  Symmetrical Chest wall movement, Good air movement bilaterally, coarse bilat B sounds RRR,No Gallops, Rubs or new Murmurs, No Parasternal Heave +ve B.Sounds, Abd Soft, No  tenderness, No organomegaly appriciated, No rebound - guarding or rigidity. No Cyanosis, Clubbing or edema, No new Rash or bruise     Data Reviewed: I have personally reviewed following labs and imaging studies  CBC: Recent Labs  Lab 05/29/18 0455  05/31/18 0445 06/01/18 0456 06/02/18 0422 06/03/18 0240 2018-06-21 0341  WBC 10.8*   < > 13.9* 13.7* 14.7* 20.7* 22.7*  NEUTROABS 10.1*  --   --   --   --   --   --   HGB 9.8*   < > 9.4* 9.0* 9.3* 10.7* 10.4*  HCT 30.0*   < > 30.4* 28.4* 29.7* 33.6* 32.6*  MCV 92.6   < > 93.3 93.4 92.0 91.3 90.3  PLT 21*   < > 36* 48* 75* 140* 160   < > = values in this interval not displayed.   Basic Metabolic Panel: Recent Labs  Lab 05/29/18 0455  05/30/18 0435 05/31/18 0445 06/01/18 0456 06/02/18 0422 06/02/18 1620 06/03/18 0240 06/21/18 0341  NA 153*   < > 143 138 133* 137  --  137 136  K 3.5   < > 3.6 2.8* 3.4* 2.9* 3.2* 4.2 3.4*  CL 107  --  94* 88* 85* 90*  --  95* 94*  CO2 36*  --  38* 40* 41* 36*  --  30 33*  GLUCOSE 213*  --  166* 166* 161* 161*  --  161* 149*  BUN 37*  --  24* _0 --  14 12  CREATININE 0.35*  --  0.34* <0.30* 0.35* <0.30*  --  0.40* 0.32*  CALCIUM 7.8*  --  7.6* 7.9* 7.7* 8.1*  --  8.2* 7.9*  MG 1.9  --  1.7 1.8 1.8 1.8  --  2.0 1.8  PHOS 3.4  --  2.7 2.8 2.7  --   --   --  3.8   < > = values in this interval not displayed.   GFR: Estimated Creatinine Clearance: 70.2 mL/min (A) (by C-G formula based on SCr of 0.32 mg/dL (L)). Liver Function Tests: Recent Labs  Lab 05/31/18 0956 06/01/18 0456 06/02/18 0422 06/03/18 0240 06/21/2018 0341  AST 56* 48* 34 50* 44*  ALT 123* 102* 76* 84* 76*  ALKPHOS 145* 145* 131* 155* 142*  BILITOT  1.2 0.9 0.8 1.3* 1.0  PROT 4.2* 4.1* 4.1* 4.7* 4.8*  ALBUMIN 1.5* 1.4* 1.4* 1.6* 1.7*   Recent Labs  Lab 05/30/18 1025 05/31/18 0445 06/02/18 0422  LIPASE 62* 43 30  AMYLASE 192*  --   --    No results for input(s): AMMONIA in the last 168 hours. Coagulation  Profile: No results for input(s): INR, PROTIME in the last 168 hours. Cardiac Enzymes: No results for input(s): CKTOTAL, CKMB, CKMBINDEX, TROPONINI in the last 168 hours. BNP (last 3 results) No results for input(s): PROBNP in the last 8760 hours. HbA1C: No results for input(s): HGBA1C in the last 72 hours. CBG: Recent Labs  Lab 06/03/18 1658 06/03/18 2352 2018-06-07 0508 06-07-2018 0806 06-07-2018 1153  GLUCAP 158* 170* 146* 168* 165*   Lipid Profile: No results for input(s): CHOL, HDL, LDLCALC, TRIG, CHOLHDL, LDLDIRECT in the last 72 hours. Thyroid Function Tests: No results for input(s): TSH, T4TOTAL, FREET4, T3FREE, THYROIDAB in the last 72 hours. Anemia Panel: No results for input(s): VITAMINB12, FOLATE, FERRITIN, TIBC, IRON, RETICCTPCT in the last 72 hours. Sepsis Labs: Recent Labs  Lab 05/30/18 1109 05/31/18 0445 06/01/18 0456 06/03/18 1455 06-07-2018 0341  PROCALCITON  --  0.58 0.75 0.62 0.79  LATICACIDVEN 1.8  --   --  1.4  --     Recent Results (from the past 240 hour(s))  Culture, bal-quantitative     Status: None   Collection Time: 05/26/18  6:21 PM  Result Value Ref Range Status   Specimen Description BRONCHIAL ALVEOLAR LAVAGE  Final   Special Requests   Final    NONE Performed at [facility], [place] [address] [place], [place] [address]    Culture NO GROWTH  Final   Report Status 05/29/2018 FINAL  Final  Acid Fast Smear (AFB)     Status: None   Collection Time: 05/26/18  6:21 PM  Result Value Ref Range Status   AFB Specimen Processing Concentration  Final   Acid Fast Smear Negative  Final    Comment: (NOTE) Performed At: [facility] [place], [place] [id number] Rush Farmer MD [id number]    Source (AFB) BRONCHIAL ALVEOLAR LAVAGE  Final  Fungus Culture With Stain     Status: None (Preliminary result)   Collection Time: 05/26/18  6:21 PM  Result Value Ref Range Status   Fungus Stain Final report  Final    Comment:  (NOTE) Performed At: [facility] [place], [place] [id number] Rush Farmer MD [id number]    Fungus (Mycology) Culture PENDING  Incomplete   Fungal Source BRONCHIAL ALVEOLAR LAVAGE  Final  Fungus Culture Result     Status: None   Collection Time: 05/26/18  6:21 PM  Result Value Ref Range Status   Result 1 Comment  Final    Comment: (NOTE) KOH/Calcofluor preparation:  no fungus observed. Performed At: [facility] [place], [place] [id number] Rush Farmer MD YI:[id number]   Respiratory Panel by PCR     Status: None   Collection Time: 05/30/18 11:15 AM  Result Value Ref Range Status   Adenovirus NOT DETECTED NOT DETECTED Final   Coronavirus 229E NOT DETECTED NOT DETECTED Final    Comment: (NOTE) The Coronavirus on the Respiratory Panel, DOES NOT test for the novel  Coronavirus (2019 nCoV)    Coronavirus HKU1 NOT DETECTED NOT DETECTED Final   Coronavirus NL63 NOT DETECTED NOT DETECTED Final   Coronavirus OC43 NOT DETECTED NOT DETECTED Final  Metapneumovirus NOT DETECTED NOT DETECTED Final   Rhinovirus / Enterovirus NOT DETECTED NOT DETECTED Final   Influenza A NOT DETECTED NOT DETECTED Final   Influenza B NOT DETECTED NOT DETECTED Final   Parainfluenza Virus 1 NOT DETECTED NOT DETECTED Final   Parainfluenza Virus 2 NOT DETECTED NOT DETECTED Final   Parainfluenza Virus 3 NOT DETECTED NOT DETECTED Final   Parainfluenza Virus 4 NOT DETECTED NOT DETECTED Final   Respiratory Syncytial Virus NOT DETECTED NOT DETECTED Final   Bordetella pertussis NOT DETECTED NOT DETECTED Final   Chlamydophila pneumoniae NOT DETECTED NOT DETECTED Final   Mycoplasma pneumoniae NOT DETECTED NOT DETECTED Final    Comment: Performed at [facility], [place] [address], [place], [place] [address]         Radiology Studies: Dg Chest Port 1 View  Result Date: 2018/06/24 CLINICAL DATA:  Respiratory failure. History of lung cancer and pneumonia.  EXAM: PORTABLE CHEST 1 VIEW COMPARISON:  Radiographs 06/02/2018 and 06/01/2018.  CT 05/25/2018. FINDINGS: 0649 hours. Right arm PICC and left subclavian Port-A-Cath are unchanged in position. The heart size and mediastinal contours are stable. Cavitary right upper lobe lesion and surrounding opacity are stable. There is diffuse pulmonary interstitial prominence which has partially cleared from the CT 10 days ago. No new airspace disease, significant pleural effusion or pneumothorax. IMPRESSION: Stable examination from recent prior studies. Cavitary right upper lobe lesion and surrounding pulmonary opacity are unchanged. Electronically Signed   By: Richardean Sale M.D.   On: June 24, 2018 08:52   Dg Abd Portable 1v  Result Date: 06/02/2018 CLINICAL DATA:  Abdominal pain. EXAM: PORTABLE ABDOMEN - 1 VIEW COMPARISON:  Radiograph of May 30, 2018. FINDINGS: The bowel gas pattern is normal. No radio-opaque calculi or other significant radiographic abnormality are seen. IMPRESSION: No evidence of bowel obstruction or ileus. Electronically Signed   By: Marijo Conception, M.D.   On: 06/02/2018 19:24    Scheduled Meds: . Chlorhexidine Gluconate Cloth  6 each Topical Daily  . enoxaparin (LOVENOX) injection  40 mg Subcutaneous Q24H  . furosemide  60 mg Intravenous TID  .  HYDROmorphone (DILAUDID) injection  0.25 mg Intravenous Q4H  . insulin aspart  0-15 Units Subcutaneous Q4H  . ipratropium  0.5 mg Nebulization BID  . levalbuterol  0.63 mg Nebulization BID  . LORazepam  0.25 mg Intravenous Q8H  . mouth rinse  15 mL Mouth Rinse BID  . methylPREDNISolone (SOLU-MEDROL) injection  125 mg Intravenous Q6H  . pantoprazole (PROTONIX) IV  40 mg Intravenous Daily  . [START ON 06/05/2018] sertraline  50 mg Per Tube Daily   Continuous Infusions: . sodium chloride Stopped (06/02/18 0717)  . dextrose 5 % and 0.45% NaCl    . feeding supplement (OSMOLITE 1.2 CAL) Stopped (06/03/18 1642)  . potassium chloride 10 mEq  (2018/06/24 1143)     LOS: 12 days    Time spent: 35 minutes.   Signature  Lala Lund M.D on June 24, 2018 at 12:11 PM   -  To page go to [email or web address]

## 2018-06-19 NOTE — Progress Notes (Signed)
Pt is now on bipap per resp therapy. Vitals charted.  Pt is anxious and reaching for mask. Will ask for extra ativan dose.

## 2018-06-19 DEATH — deceased

## 2018-06-22 NOTE — Discharge Summary (Signed)
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 patient with    has a past medical history of Anxiety, Depression, Lung cancer ([place]), Migraine, Pneumonia, and Seizures ([place]).   has a past surgical history that includes Abdominal hysterectomy (2000); Portacath placement (Left, 03/14/2018); and Video bronchoscopy with endobronchial ultrasound (N/A, 03/21/2018).   Admitted on 06/14/2018 with   53 year old female with PMH adenocarcinoma of right lung, seizure disorder, depression, anxiety, migraine who presented to [facility] ED on 05/27/2018 for shortness of breath with associated cough and vomiting. Shortness of breath began 05/22/2018 and cough began 05/20/2018. Cough is productive with sputum that is purulent and dark brown in color. The patient endorsed vomiting which began 05/21/2018, and had poor oral intake x 3 days prior to presentation on 2/3. She endorsed recent illness, with recent hospitalization 1/4-1/6 2020 for PNA (aspiration and postobstructive) causing sepsis. She denied loose stools and abdominal pain. She endorsed chronic immunosuppression, receiving chemotherapy for lung cancer.    In ED at [facility], the patient was hypotensive, tachycardic and tachypneic. A CXR revealed R lung opacities, concerning for PNA and the patient was started on vanc/cefepime, later changed to pip/tazo and diflucan.   The continued treatment for HCAP. She underwent a CTA on 2/5 at which time PE was ruled out and it was noted that the underlying PNA was worse. Her respiratory status continued to decline, requiring BiPAP. With ongoing high BiPAP settings, the decision was made to intubate the patient 2/6 and transfer to [facility].  PCCM consulted for admission.    EVENTS 2/3> admitted to [facility] 2/3 CXR> RUL opacities, RLL opacity. -- personally reviewed 2/6 2/5 CTA chest> no PE visualized on CTA. Increased cavitary lesion of right lung and right upper lobe, fistulization of R mainstem bronchus. Diffuse opacities throughout R lung. Small bilateral pleural effusions 2/6> intubated, transferred to [facility]  2/6> endotracheal intubation, bronch with BAL 2/10 UE Venous dopplers: No DVT bilaterally  2/10 Abd [place]: mild dilation of common bile duct, fatty infiltration of liver 2/10 Abd Xray: neg  2/11 CT ab: c/w mild pancreatitis and likely gall stone passage  2/12>extubated  2/13- Patient very tired this morning. Nods that she is not doing well but does not state why.  2/15 - progrssive hypoxemic resp failure and moved to ICU and ccm recalled.In floor - conflict in goals between patient (wants DNR, comfort) v daughter/husband (who want full code). Moved to ICU for intubation. Upomn cCM arrival RR 30s and desaturations to 70s. Husband/daughter initially saying that patient was improving all along since ICU admission but now shocked she has declined. They  feel is anxiety and loss of capacity and she never had capacity to make DNR (other care providers did not agree with it) ., Higher dose steroids started yesterday but not responding to that or lasix 2/15 PM:  - explained with RN Lilia Pro that patient is in  terminal decline and not responding to Rx  - explained non survivable. - explained prognopsis is hours to tdays. - after this they agreed for comfort measures with DNR. - terminal care orders with opioid gtt orders written  2/16 - patient expired  SIGNED Dr. Brand Males, M.D., [facility].C.P Pulmonary and Critical Care Medicine Staff Physician [place] [facility] Pager: [phone], If no answer or between  15:00h - 7:00h: call [phone]  06/22/2018 1:12 PM

## 2018-07-05 LAB — FUNGUS CULTURE WITH STAIN

## 2018-07-05 LAB — FUNGAL ORGANISM REFLEX

## 2018-07-05 LAB — FUNGUS CULTURE RESULT

## 2018-07-09 LAB — ACID FAST CULTURE WITH REFLEXED SENSITIVITIES (MYCOBACTERIA): Acid Fast Culture: NEGATIVE

## 2018-07-12 LAB — PNEUMOCYSTIS JIROVECI SMEAR BY DFA: Pneumocystis jiroveci Ag: NEGATIVE

## 2020-07-02 IMAGING — CT CT ANGIO CHEST
2 of 6 series · 17 of 46 positions shown · IV contrast (Isovue)
Comparison: CT chest 02/23/2018, PET-CT 03/11/2018

CLINICAL DATA: Complex chest pain, shortness of breath, non-small
cell lung cancer

EXAM:
CT ANGIOGRAPHY CHEST WITH CONTRAST
TECHNIQUE: Multidetector CT imaging of the chest was performed using the
standard protocol during bolus administration of intravenous
contrast. Multiplanar CT image reconstructions and MIPs were
obtained to evaluate the vascular anatomy.
CONTRAST:  75mL P3VBYW-AOO IOPAMIDOL (P3VBYW-AOO) INJECTION 76% IV

[Series 5: thins · axial · 0.65mm/px · z∈[+1303,+1588]mm · 14 of 313 slices shown]
[im 14/313  lung]
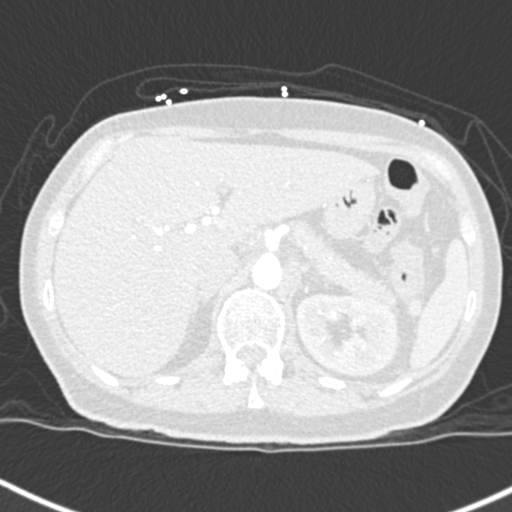
[im 41/313  soft-tissue]
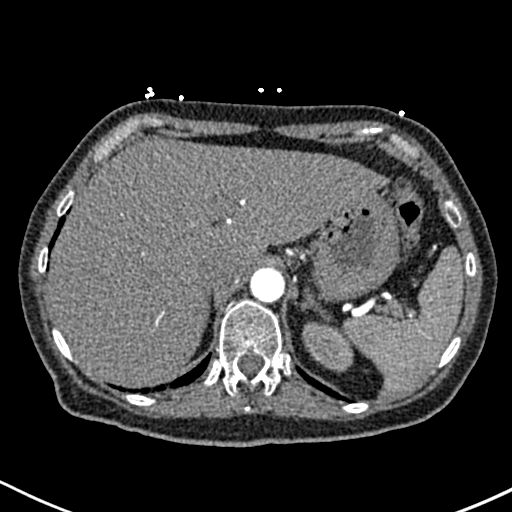
[im 55/313  lung]
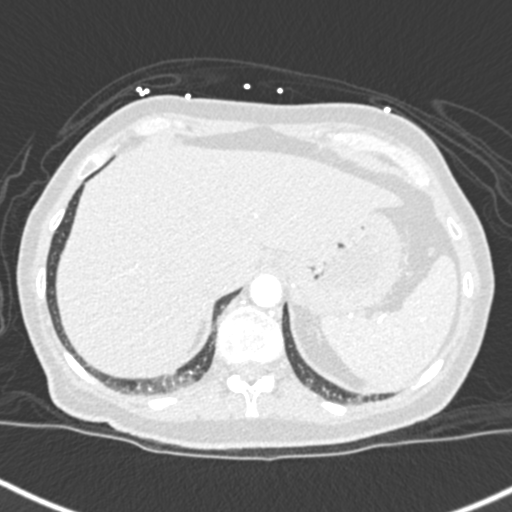
[im 82/313  soft-tissue]
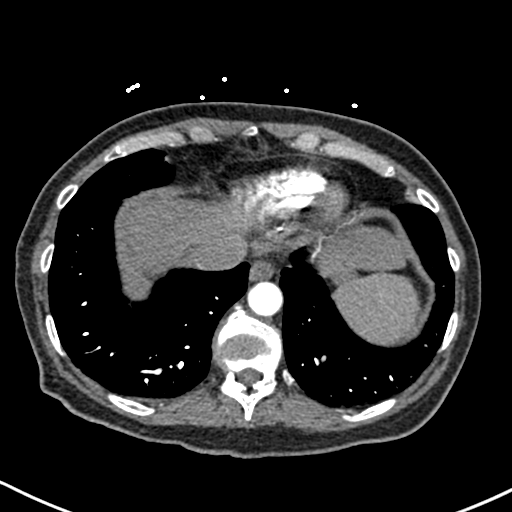
[im 109/313  lung]
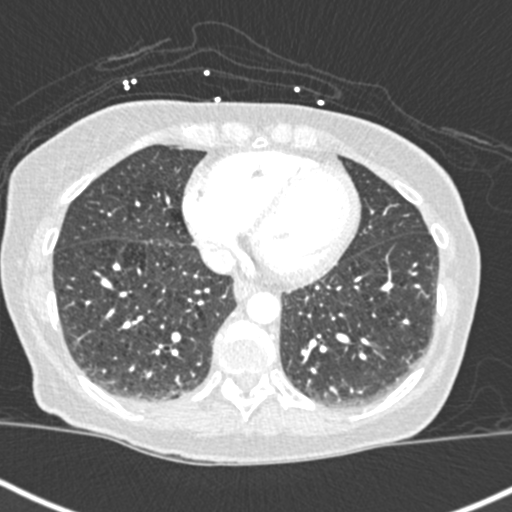
[im 123/313  soft-tissue]
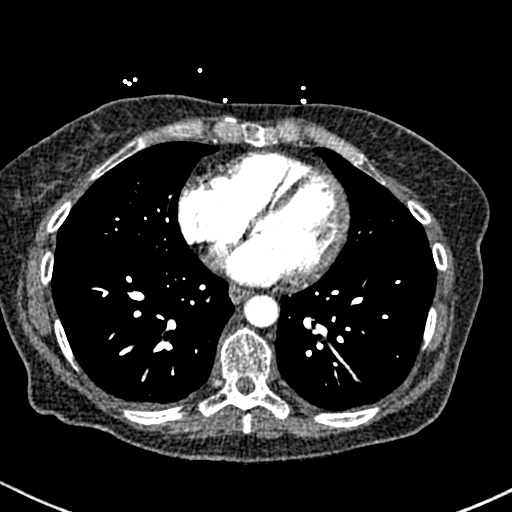
[im 150/313  lung]
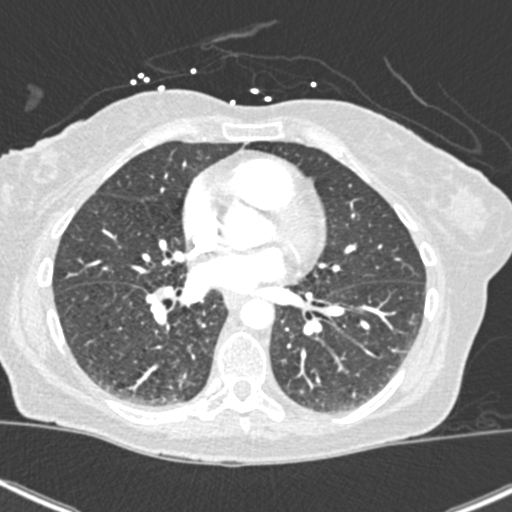
[im 163/313  soft-tissue]
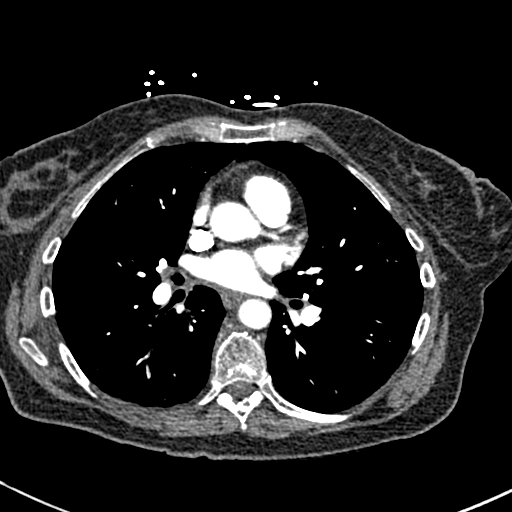
[im 190/313  lung]
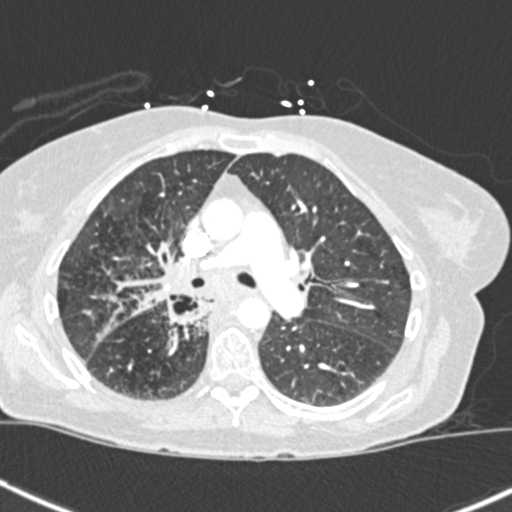
[im 204/313  soft-tissue]
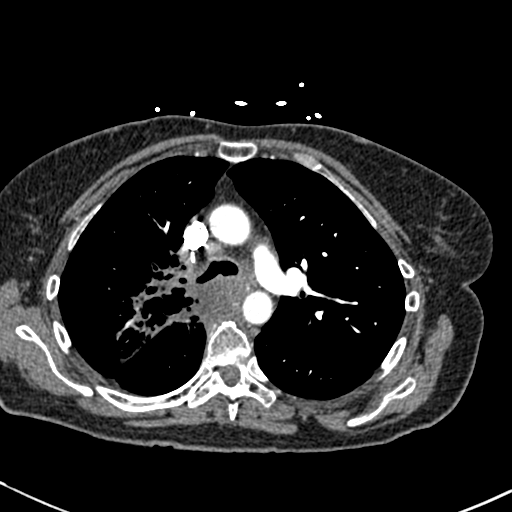
[im 231/313  lung]
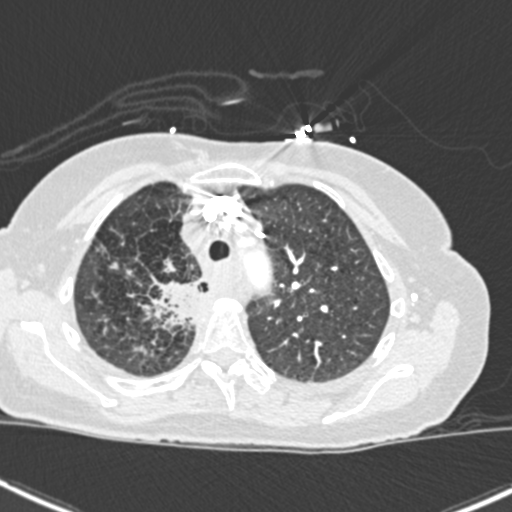
[im 258/313  soft-tissue]
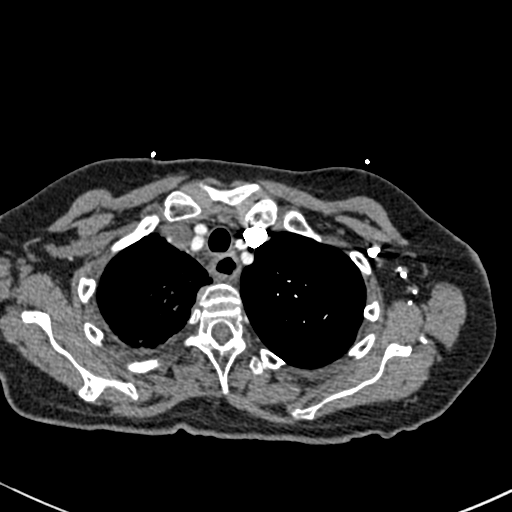
[im 272/313  lung]
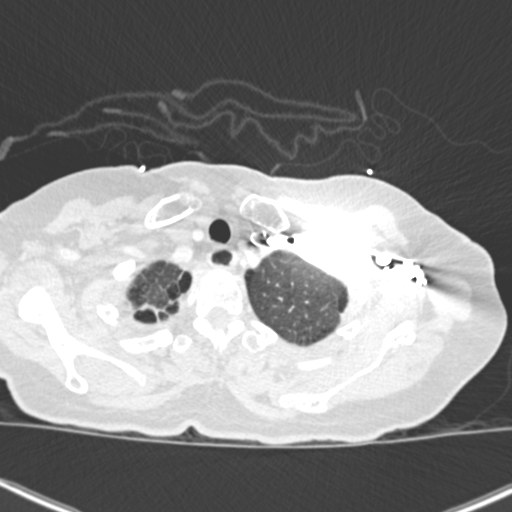
[im 299/313  soft-tissue]
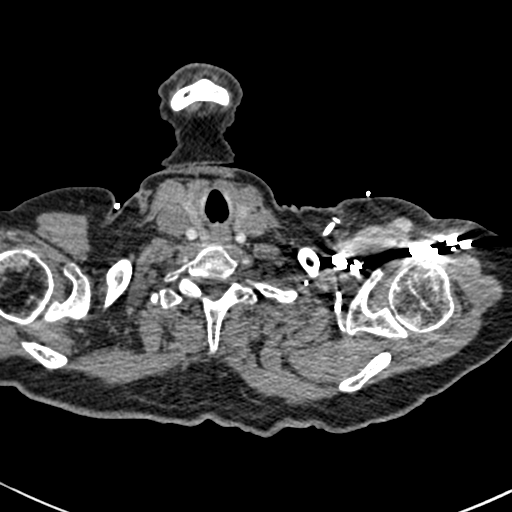

[Series 7: coronal mpr · coronal · 0.63mm/px · 3 of 113 slices shown]
[im 29/113  soft-tissue]
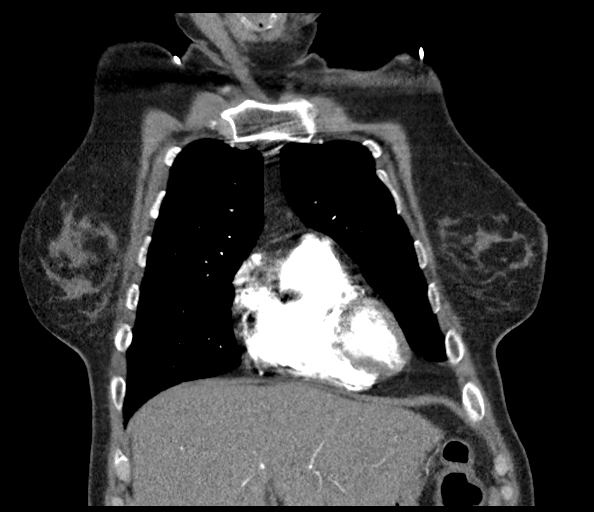
[im 57/113  soft-tissue]
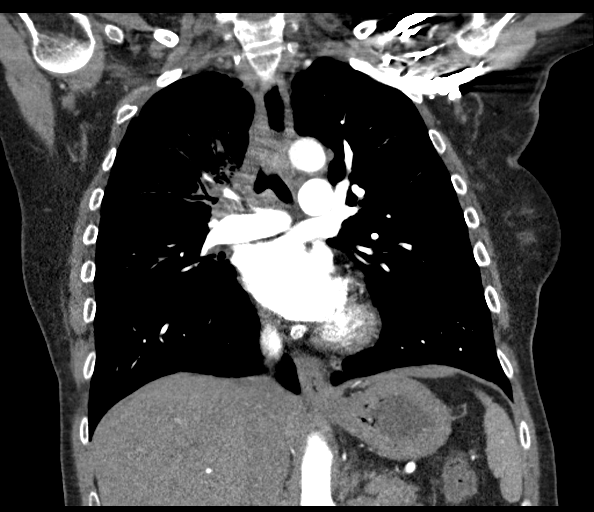
[im 85/113  soft-tissue]
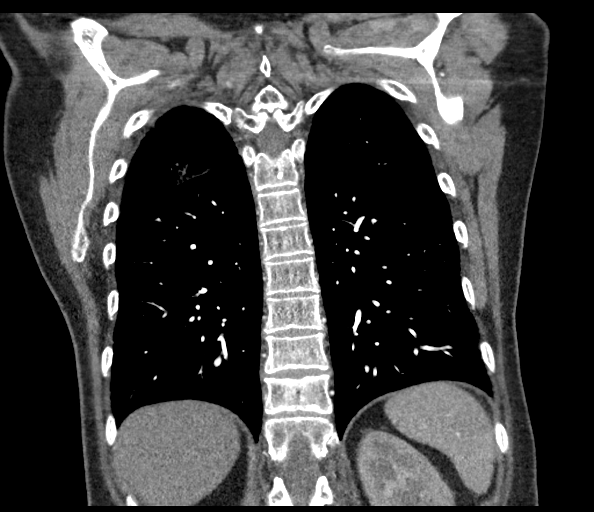

[17 of 46 positions shown; findings below may reference images not displayed]

FINDINGS: Cardiovascular: Aorta normal caliber without aneurysm or dissection.
No pericardial effusion.

Pulmonary arteries well opacified and patent. No evidence of
pulmonary embolism.

Mediastinum/Nodes: Base of cervical region normal appearance.
Diffuse infiltration of RIGHT hilum, subcarinal region, and inferior
RIGHT paratracheal region by tumor/confluent adenopathy. Tumor
surrounds the RIGHT mainstem bronchus, origin of the RIGHT upper
lobe bronchus, and the proximal bronchus intermedius. Tumor
encompasses the midthoracic esophagus the inferior and posterior
margins of the proximal LEFT mainstem bronchus, and abuts the RIGHT
lateral aspect of the descending thoracic aorta. Tumor infiltration
extends cranially in the RIGHT paratracheal region, and extends
posteriorly into the RIGHT paraspinal region. Question confluent
RIGHT hilar adenopathy versus tumor. 7 mm short axis precarinal node
image 37. No additional adenopathy.

Lungs/Pleura: Emphysematous changes. Peribronchial thickening with
interseptal thickening throughout the RIGHT upper lobe. Scattered
bullous changes. Progressive cavitation of tumor in the superior
segment of the RIGHT lower lobe adjacent to the hilum, overall size
little changed at 2.7 x 2.4 cm. Minimal dependent atelectasis in the
posterior lower lobes. Question small nodule versus atelectasis at
lateral sulcus LEFT lung base 5 mm diameter image 120.

Upper Abdomen: Probable splenule adjacent to splenic hilum.
Remaining visualized upper abdomen unremarkable.

Musculoskeletal: Diffuse osseous demineralization. No acute bone
lesions.

Review of the MIP images confirms the above findings.
IMPRESSION: No evidence of pulmonary embolism.

Progressive cavitation of RIGHT perihilar tumor though overall size
appears stable.

Extensive underlying bronchitic and bullous emphysematous changes
with persistent interseptal thickening, especially in RIGHT upper
lobe.

Persistent extensive tumor/confluent adenopathy invading the RIGHT
hilum and mediastinum, appears little changed in size versus prior
exam, with tumor seen extending around the RIGHT mainstem bronchus,
RIGHT upper lobe bronchus, proximal bronchus intermedius, esophagus,
and adjacent to the proximal LEFT mainstem bronchus in addition to
extending into the RIGHT paraspinal region.

Emphysema ([id number]).
